# Patient Record
Sex: Female | Born: 1960 | Race: White | Hispanic: No | Marital: Married | Smoking: Former smoker
Health system: Southern US, Community
[De-identification: ages and names within clinical notes are randomized; demographics above are authoritative.]

## PROBLEM LIST (undated history)

## (undated) DIAGNOSIS — G4700 Insomnia, unspecified: Secondary | ICD-10-CM

## (undated) DIAGNOSIS — I5189 Other ill-defined heart diseases: Secondary | ICD-10-CM

## (undated) DIAGNOSIS — B009 Herpesviral infection, unspecified: Secondary | ICD-10-CM

## (undated) DIAGNOSIS — C73 Malignant neoplasm of thyroid gland: Secondary | ICD-10-CM

## (undated) DIAGNOSIS — R079 Chest pain, unspecified: Secondary | ICD-10-CM

## (undated) DIAGNOSIS — I10 Essential (primary) hypertension: Secondary | ICD-10-CM

## (undated) DIAGNOSIS — J342 Deviated nasal septum: Secondary | ICD-10-CM

## (undated) DIAGNOSIS — G709 Myoneural disorder, unspecified: Secondary | ICD-10-CM

## (undated) DIAGNOSIS — E039 Hypothyroidism, unspecified: Secondary | ICD-10-CM

## (undated) DIAGNOSIS — F32A Depression, unspecified: Secondary | ICD-10-CM

## (undated) DIAGNOSIS — F329 Major depressive disorder, single episode, unspecified: Secondary | ICD-10-CM

## (undated) DIAGNOSIS — T7840XA Allergy, unspecified, initial encounter: Secondary | ICD-10-CM

## (undated) HISTORY — DX: Malignant neoplasm of thyroid gland: C73

## (undated) HISTORY — DX: Herpesviral infection, unspecified: B00.9

## (undated) HISTORY — DX: Essential (primary) hypertension: I10

## (undated) HISTORY — DX: Depression, unspecified: F32.A

## (undated) HISTORY — DX: Insomnia, unspecified: G47.00

## (undated) HISTORY — DX: Major depressive disorder, single episode, unspecified: F32.9

## (undated) HISTORY — DX: Myoneural disorder, unspecified: G70.9

## (undated) HISTORY — DX: Deviated nasal septum: J34.2

## (undated) HISTORY — DX: Allergy, unspecified, initial encounter: T78.40XA

## (undated) HISTORY — PX: THYROIDECTOMY: SHX17

## (undated) HISTORY — DX: Hypothyroidism, unspecified: E03.9

---

## 1997-04-29 ENCOUNTER — Inpatient Hospital Stay (HOSPITAL_COMMUNITY): Admission: AD | Admit: 1997-04-29 | Discharge: 1997-04-29 | Payer: Self-pay | Admitting: Gynecology

## 1997-04-30 ENCOUNTER — Inpatient Hospital Stay (HOSPITAL_COMMUNITY): Admission: AD | Admit: 1997-04-30 | Discharge: 1997-04-30 | Payer: Self-pay | Admitting: Gynecology

## 1997-06-06 ENCOUNTER — Ambulatory Visit (HOSPITAL_COMMUNITY): Admission: RE | Admit: 1997-06-06 | Discharge: 1997-06-06 | Payer: Self-pay | Admitting: Gynecology

## 1997-07-26 ENCOUNTER — Other Ambulatory Visit: Admission: RE | Admit: 1997-07-26 | Discharge: 1997-07-26 | Payer: Self-pay | Admitting: *Deleted

## 1997-09-22 ENCOUNTER — Inpatient Hospital Stay (HOSPITAL_COMMUNITY): Admission: AD | Admit: 1997-09-22 | Discharge: 1997-09-25 | Payer: Self-pay | Admitting: Gynecology

## 1997-09-25 ENCOUNTER — Encounter (HOSPITAL_COMMUNITY): Admission: RE | Admit: 1997-09-25 | Discharge: 1997-11-16 | Payer: Self-pay | Admitting: Gynecology

## 1997-10-31 ENCOUNTER — Other Ambulatory Visit: Admission: RE | Admit: 1997-10-31 | Discharge: 1997-10-31 | Payer: Self-pay | Admitting: Gynecology

## 1998-11-26 ENCOUNTER — Other Ambulatory Visit: Admission: RE | Admit: 1998-11-26 | Discharge: 1998-11-26 | Payer: Self-pay | Admitting: Gynecology

## 2000-06-30 ENCOUNTER — Ambulatory Visit (HOSPITAL_COMMUNITY): Admission: RE | Admit: 2000-06-30 | Discharge: 2000-06-30 | Payer: Self-pay | Admitting: Gynecology

## 2000-06-30 ENCOUNTER — Encounter (INDEPENDENT_AMBULATORY_CARE_PROVIDER_SITE_OTHER): Payer: Self-pay | Admitting: *Deleted

## 2000-06-30 HISTORY — PX: DILATION AND CURETTAGE OF UTERUS: SHX78

## 2000-08-12 ENCOUNTER — Other Ambulatory Visit: Admission: RE | Admit: 2000-08-12 | Discharge: 2000-08-12 | Payer: Self-pay | Admitting: Gynecology

## 2002-03-28 ENCOUNTER — Other Ambulatory Visit: Admission: RE | Admit: 2002-03-28 | Discharge: 2002-03-28 | Payer: Self-pay | Admitting: Gynecology

## 2006-03-26 ENCOUNTER — Inpatient Hospital Stay (HOSPITAL_COMMUNITY): Admission: EM | Admit: 2006-03-26 | Discharge: 2006-03-27 | Payer: Self-pay | Admitting: Emergency Medicine

## 2006-03-26 ENCOUNTER — Encounter (INDEPENDENT_AMBULATORY_CARE_PROVIDER_SITE_OTHER): Payer: Self-pay | Admitting: Specialist

## 2006-03-26 HISTORY — PX: CHOLECYSTECTOMY: SHX55

## 2006-04-06 ENCOUNTER — Encounter: Admission: RE | Admit: 2006-04-06 | Discharge: 2006-04-06 | Payer: Self-pay | Admitting: Internal Medicine

## 2007-01-21 DIAGNOSIS — C73 Malignant neoplasm of thyroid gland: Secondary | ICD-10-CM

## 2007-01-21 HISTORY — DX: Malignant neoplasm of thyroid gland: C73

## 2007-02-03 ENCOUNTER — Ambulatory Visit: Payer: Self-pay | Admitting: Unknown Physician Specialty

## 2007-10-21 ENCOUNTER — Ambulatory Visit: Payer: Self-pay | Admitting: Oncology

## 2007-11-15 ENCOUNTER — Ambulatory Visit: Payer: Self-pay | Admitting: Unknown Physician Specialty

## 2007-11-16 ENCOUNTER — Ambulatory Visit: Payer: Self-pay | Admitting: Unknown Physician Specialty

## 2007-11-21 ENCOUNTER — Ambulatory Visit: Payer: Self-pay | Admitting: Oncology

## 2007-11-23 ENCOUNTER — Ambulatory Visit: Payer: Self-pay | Admitting: Unknown Physician Specialty

## 2007-12-03 ENCOUNTER — Ambulatory Visit: Payer: Self-pay | Admitting: Oncology

## 2007-12-21 ENCOUNTER — Ambulatory Visit: Payer: Self-pay | Admitting: Oncology

## 2008-01-10 ENCOUNTER — Ambulatory Visit: Payer: Self-pay | Admitting: Oncology

## 2008-01-21 ENCOUNTER — Ambulatory Visit: Payer: Self-pay | Admitting: Oncology

## 2008-02-21 ENCOUNTER — Ambulatory Visit: Payer: Self-pay | Admitting: Oncology

## 2008-05-02 ENCOUNTER — Encounter (HOSPITAL_COMMUNITY): Admission: RE | Admit: 2008-05-02 | Discharge: 2008-07-19 | Payer: Self-pay | Admitting: Endocrinology

## 2008-05-11 ENCOUNTER — Encounter (HOSPITAL_COMMUNITY): Admission: RE | Admit: 2008-05-11 | Discharge: 2008-07-19 | Payer: Self-pay | Admitting: Endocrinology

## 2008-05-19 ENCOUNTER — Encounter (HOSPITAL_COMMUNITY): Admission: RE | Admit: 2008-05-19 | Discharge: 2008-07-19 | Payer: Self-pay | Admitting: Endocrinology

## 2010-02-10 ENCOUNTER — Encounter: Payer: Self-pay | Admitting: Endocrinology

## 2010-05-01 LAB — HCG, SERUM, QUALITATIVE: Preg, Serum: NEGATIVE

## 2010-06-07 NOTE — H&P (Signed)
[name], [name]             ACCOUNT NO.:  [id number]   MEDICAL RECORD NO.:  [id number]          PATIENT TYPE:  EMS   LOCATION:  ED                           FACILITY:  [facility]   PHYSICIAN:  Lorne Skeens. Hoxworth, M.D.DATE OF BIRTH:  1960/06/10   DATE OF ADMISSION:  03/26/2006  DATE OF DISCHARGE:                              HISTORY & PHYSICAL   CHIEF COMPLAINT:  Right upper quadrant abdominal pain.   HISTORY OF PRESENT ILLNESS:  [name] is a 50 year old white  female who at 4:30 this morning, now about 6 hours ago, was awakened  with severe pain in her right upper quadrant.  She describes ball of  fire with pressure and burning with radiation around to her right  flank.  This was associated with nausea and vomiting.  The pain  persisted, and she presented to the [facility] emergency room.  She has  received some pain medication which initially improved the pain but it  has now recurred as the medication has worn off.  She states she has had  some milder episodic pain in the right upper quadrant for several weeks.  No other history of any significant abdominal or GI complaints.  No  fever, chills.  She has not noted any jaundice.  No change in bowel  habits, melena, hematochezia.  No urinary symptoms.   PAST MEDICAL HISTORY:  Medically she is followed for hypertension,  depression and herpes.  She has a remote history of asthma without any  recent flare-ups.  Surgery includes C-section and deviated septum.   MEDICATIONS:  1. Lisinopril.  2. Hydrochlorothiazide 20/12.5 daily.  3. Prozac 40 daily.  4. Acyclovir daily, unknown dose.   She is followed regularly by Dr. Ellamae Sia.   ALLERGIES:  None.   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  Does  not smoke cigarettes or drink alcohol.  She is married.   FAMILY HISTORY:  Noncontributory.   REVIEW OF SYSTEMS:  GENERAL:  No fever, chills, weight change.  RESPIRATORY:  No shortness  of breath, cough, wheezing.  CARDIAC:  No  chest pain, palpitations, history of heart disease.  ABDOMEN:  GI as  above.  GU:  As above.   PHYSICAL EXAM:  VITAL SIGNS:  Temperature is 97, pulse 79, respirations  20, blood pressure 123/74.  GENERAL:  She is a mildly obese white female who appears uncomfortable.  SKIN:  Warm and dry.  No rash or infection.  HEENT:  No palpable mass or thyromegaly.  Sclerae nonicteric.  LYMPH NODES:  No cervical, subclavicular, or inguinal nodes palpable.  LUNGS:  Clear without wheezing or increased work of breathing.  CARDIAC:  Regular rate and rhythm.  No murmurs.  No edema.  No JVD.  ABDOMEN:  Bowel sounds are present.  Nondistended.  There is moderate  epigastric and right upper quadrant tenderness with guarding.  No  palpable masses or hepatosplenomegaly.  EXTREMITIES:  No joint swelling or deformity.  NEUROLOGIC:  Alert, oriented.  Mental status exam is grossly normal.   LABORATORY:  CBC, electrolytes, LFTs, lipase, urinalysis all  within  normal limits.   Ultrasound of the gallbladder has been obtained showing gallstones and  thickened gallbladder wall.   ASSESSMENT/PLAN:  Acute right upper quadrant abdominal pain and  gallbladder ultrasound showing stones and thickened gallbladder wall.  She appears to have persistent biliary colic or early acute  cholecystitis.  Her pain has recurred after IV pain medication.  The  patient will be admitted, made n.p.o.  Antibiotics will be started.  We  will plan to proceed with urgent laparoscopic cholecystectomy with  cholangiogram.      Sharlet Salina T. Hoxworth, M.D.  Electronically Signed     BTH/MEDQ  D:  03/26/2006  T:  03/26/2006  Job:  [id number]

## 2010-06-07 NOTE — Op Note (Signed)
[name], [name]             ACCOUNT NO.:  [id number]   MEDICAL RECORD NO.:  [id number]          PATIENT TYPE:  INP   LOCATION:  0098                         FACILITY:  [facility]   PHYSICIAN:  Sharlet Salina T. Hoxworth, M.D.DATE OF BIRTH:  [birth year]   DATE OF PROCEDURE:  03/26/2006  DATE OF DISCHARGE:                               OPERATIVE REPORT   PREOPERATIVE DIAGNOSIS:  Cholelithiasis and cholecystitis.   POSTOPERATIVE DIAGNOSIS:  Cholelithiasis and cholecystitis.   SURGICAL PROCEDURES:  Laparoscopic cholecystectomy with intraoperative  cholangiogram.   SURGEON:  Sharlet Salina T. Hoxworth, M.D.   ASSISTANT:  Anselm Pancoast. Zachery Dakins, M.D.   ANESTHESIA:  General.   BRIEF HISTORY:  Ms. Morgan is a 50 year old female who presents with  acute severe right upper quadrant abdominal pain of less than 24 hours  duration.  She has had a workup including a gallbladder ultrasound  showing cholelithiasis and some thickening of the gallbladder wall.  She  has localized right upper quadrant tenderness.  A laparoscopic  cholecystectomy with cholangiogram has been recommended for apparent  early acute cholecystitis.  The nature of the procedure, its  indications, risks of bleeding, infection, bile leak and bile duct  injury were discussed and understood preoperatively.  She is now brought  to the operating room for the procedure.   DESCRIPTION OF PROCEDURE:  The patient was brought to the operating room  and placed in the supine position on the operating table and general  endotracheal anesthesia was induced.  The abdomen was widely sterilely  prepped and draped.  The correct patient and procedure were verified.  She received preoperative antibiotics.  PAS were in place.  Local  anesthesia was used to infiltrate the trocar sites.  A 1 cm incision was  made in the midline just above the umbilicus.  Dissection was carried  down to the midline fascia which was sharply incised 1 cm and the  peritoneum entered under direct vision.  Through a mattress sutures of 0  Vicryl, the Hassan trocar was placed and pneumoperitoneum established.  Under direct vision, a 10 mm trocar was placed in the subxiphoid area  and two 5 mm trocars on the right subcostal margin.   The gallbladder was visualized and was edematous.  The fundus was  grasped and elevated up over the liver and the infundibulum retracted  inferolaterally.  The peritoneum anterior and posterior to Calot's  triangle was incised.  The fibrofatty tissue was stripped off the neck  of the gallbladder toward the porta hepatis.  Calot's triangle was  thoroughly dissected and the distal gallbladder cystic duct junction  identified and dissected 360 degrees.  When the anatomy was clear, the  cystic duct was clipped at the gallbladder junction and an operative  cholangiogram obtained through the cystic duct.  This showed good  filling of the common bile duct and intrahepatic ducts with free flow  into the duodenum and no filling defects.  Following this, the  cholangiocath was removed and the cystic duct was doubly clipped  proximally and divided.  The cystic artery was clearly seen coursing  upon the gallbladder wall and was  divided between proximal and distal  clips.  The gallbladder was then dissected free from its bed using hook  cautery and removed through the umbilicus.  Complete hemostasis was  obtained in the gallbladder bed and the right upper quadrant irrigated  and suctioned until clear.  The trocars were removed under direct vision  and all CO2 evacuated.  The mattress suture was secured at the umbilicus and skin incisions were  closed with interrupted subcuticular 4-0 Monocryl and Steri-Strips.  Sponge, instrument, and needle counts were correct.  The patient was  taken to the recovery room in good condition.      Lorne Skeens. Hoxworth, M.D.  Electronically Signed     BTH/MEDQ  D:  03/26/2006  T:  03/26/2006   Job:  [id number]

## 2010-06-07 NOTE — Op Note (Signed)
[facility]  Patient:    [name], [name]                    MRN: [id number] Proc. Date: 06/30/00 Adm. Date:  [id number] Attending:  Douglass Rivers                           Operative Report  PREOPERATIVE DIAGNOSIS:       Dysfunctional bleeding.  POSTOPERATIVE DIAGNOSIS:      Dysfunctional bleeding.  OPERATION:                    Dilatation and curettage.  SURGEON:                      [place] S. Emilee Hero, M.D.  ANESTHESIA:                   MAC with a paracervical block.  INDICATIONS:                  The patient is a 50 year old G1, P1, with history of prolonged vaginal bleeding.  Her LMP was before May 17 and stated that beginning last night she started bleeding very heavily, nonclotted blood, using two tampons and a pad simultaneously and changing them every 30 minutes. Prolactin, TSH, gonorrhea, and chlamydial cultures were all done and were negative.  There is a family history of both breast and ovarian cancer. Ultrasound showed a possible endometrial polyp that measured 11 x 12, done incidentally without prior knowledge of the dysfunctional bleeding.  FINDINGS:                     Uterus sounded to 8 cm, was anteverted and mobile.  Pathology was endometrial curettings.  DESCRIPTION OF PROCEDURE:     The patient was taken to the operating room.  IV sedation was induced, and placed in the dorsal lithotomy position, prepped and draped in the usual sterile fashion.  A bivalve speculum was placed in the vagina.  Cervix was stabilized with a single-tooth tenaculum, and a paracervical block was placed with 10 cc of 1% lidocaine solution.  The cervix was then gently dilated up to 21 [place], and a small curette was then placed through the cervix and advanced to the cavity.  Prior to dilation, uterine sound was placed to confirm the orientation of the canal.  The curettings were obtained.  Good cri was noted throughout.  There was no gross defect  noted in the cavity.  The polyp forceps were then placed in the uterus to collect any otherwise-remaining tissue.  The instruments were then removed from the vagina.  There was gentle pressure placed at the four sites of the paracervical block that had a small amount of bleeding.  The uterus was massaged.  Reinspection of the cervix assured [place] of only a slight trickle of blood from two of the four sites of the paracervical block but no evidence of gross active bleeding from the cervix.  The patient tolerated the procedure well.  Sponge, instrument, and needle counts correct x 2, and she was taken to the PACU in stable condition. DD:  06/30/00 TD:  06/30/00 Job: [id number]

## 2010-10-28 ENCOUNTER — Institutional Professional Consult (permissible substitution): Payer: Self-pay | Admitting: Cardiology

## 2010-11-06 ENCOUNTER — Institutional Professional Consult (permissible substitution): Payer: Self-pay | Admitting: Cardiology

## 2010-11-20 ENCOUNTER — Institutional Professional Consult (permissible substitution): Payer: BC Managed Care – PPO | Admitting: Cardiology

## 2010-12-23 ENCOUNTER — Encounter: Payer: Self-pay | Admitting: *Deleted

## 2010-12-23 ENCOUNTER — Encounter: Payer: Self-pay | Admitting: Cardiology

## 2010-12-24 ENCOUNTER — Encounter: Payer: Self-pay | Admitting: *Deleted

## 2010-12-25 ENCOUNTER — Encounter: Payer: Self-pay | Admitting: Cardiology

## 2010-12-25 ENCOUNTER — Ambulatory Visit (INDEPENDENT_AMBULATORY_CARE_PROVIDER_SITE_OTHER): Payer: Self-pay | Admitting: Cardiology

## 2010-12-25 DIAGNOSIS — R079 Chest pain, unspecified: Secondary | ICD-10-CM

## 2010-12-25 DIAGNOSIS — I10 Essential (primary) hypertension: Secondary | ICD-10-CM

## 2010-12-25 MED ORDER — METOPROLOL SUCCINATE ER 50 MG PO TB24: 50.0000 mg | ORAL_TABLET | Freq: Every day | ORAL | Status: DC

## 2010-12-25 MED ORDER — METOPROLOL SUCCINATE ER 25 MG PO TB24: 25.0000 mg | ORAL_TABLET | Freq: Every day | ORAL | Status: DC

## 2010-12-25 NOTE — Progress Notes (Signed)
Addended by: Freddi Starr on: 12/25/2010 04:42 PM   Modules accepted: Orders

## 2010-12-25 NOTE — Progress Notes (Signed)
HPI: 50 yo female with no prior cardiac history for evaluation of chest pain. Patient does have dyspnea on exertion but there is no orthopnea, PND, pedal edema, palpitations, syncope or exertional chest pain. She has had problems with indigestion over the past 6 months. The symptoms are epigastric and improve with TUMS or Nexium. 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 The pain was in the left chest area and described as a sharp sensation. It increased with inspiration. There was mild diaphoresis but no shortness of breath or nausea. It radiated to her back. Duration approximately 10 minutes and resolve spontaneously. She had 2 lesser episodes since then with the most recent being 2 weeks ago. Because of the above we were asked to further evaluate.  Current Outpatient Prescriptions  Medication Sig Dispense Refill  . calcitRIOL (ROCALTROL) 0.25 MCG capsule Take 0.5 mcg by mouth daily.        Marland Kitchen desvenlafaxine (PRISTIQ) 100 MG 24 hr tablet Take 100 mg by mouth daily.        Marland Kitchen levothyroxine (SYNTHROID, LEVOTHROID) 200 MCG tablet Take 200 mcg by mouth daily.        . rizatriptan (MAXALT) 10 MG tablet Take 10 mg by mouth as needed. May repeat in 2 hours if needed. For severe migraines       . triamterene-hydrochlorothiazide (DYAZIDE) 37.5-25 MG per capsule Take 1 capsule by mouth every morning.          No Known Allergies  Past Medical History  Diagnosis Date  . HTN (hypertension)   . Herpes   . Depression   . Asthma   . Deviated septum   . Insomnia   . Hypothyroid   . Thyroid cancer     Past Surgical History  Procedure Date  . Cesarean section   . Thyroidectomy   . Dilatation and curettage   . Cholecystectomy     History   Social History  . Marital Status: Married    Spouse Name: N/A    Number of Children: 1  . Years of Education: N/A   Occupational History  .      Teach elementary school   Social History Main Topics  . Smoking status: Never  Smoker   . Smokeless tobacco: Not on file  . Alcohol Use: Yes     Rarely  . Drug Use: No  . Sexually Active: Not on file   Other Topics Concern  . 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  Does not smoke cigarettes or drink alcohol.  She is married.    Family History  Problem Relation Age of Onset  . Heart disease Father     CABG in his 40s  . Heart disease Brother     CABG  . Heart disease Sister     Stents in 32s  . Heart disease Sister     CABG in 40s    ROS:  no fevers or chills, productive cough, hemoptysis, dysphasia, odynophagia, melena, hematochezia, dysuria, hematuria, rash, seizure activity, orthopnea, PND, pedal edema, claudication. Remaining systems are negative.  Physical Exam:  Blood pressure 172/110, pulse 87, height 5\' 4"  (1.626 m), weight 192 lb (87.091 kg).  General:  Well developed/well nourished in NAD Skin warm/dry Patient not depressed No peripheral clubbing Back-normal HEENT-normal/normal eyelids Neck supple/normal carotid upstroke bilaterally; no bruits; no JVD; no thyromegaly chest - CTA/ normal expansion CV -  RRR/normal S1 and S2; no murmurs, rubs or gallops;  PMI nondisplaced Abdomen -NT/ND, no HSM, no mass, + bowel sounds, no bruit 2+ femoral pulses, no bruits Ext-no edema, chords, 2+ DP Neuro-grossly nonfocal  ECG NSR, nonspecific ST changes

## 2010-12-25 NOTE — Assessment & Plan Note (Signed)
Blood pressure elevated. Recheck in the office was 170 over 104. Continue diuretic. Add Toprol 50 mg daily. Increase medications as needed.

## 2010-12-25 NOTE — Patient Instructions (Signed)
Your physician recommends that you schedule a follow-up appointment in: 4-6 WEEKS  Your physician has requested that you have a stress echocardiogram. For further information please visit [email or web address] Please follow instruction sheet as given.   START METOPROLOL SUCC 25 MG ONCE DAILY

## 2010-12-25 NOTE — Assessment & Plan Note (Signed)
Symptoms are atypical but she has an extremely strong family history of coronary disease. No chest pain in approximately 2 weeks. Plan to proceed with a stress echocardiogram for risk stratification. Low threshold for cardiac catheterization.

## 2011-01-01 ENCOUNTER — Other Ambulatory Visit: Payer: Self-pay | Admitting: *Deleted

## 2011-01-15 ENCOUNTER — Other Ambulatory Visit (HOSPITAL_COMMUNITY): Payer: Self-pay | Admitting: Radiology

## 2011-01-20 ENCOUNTER — Ambulatory Visit (HOSPITAL_COMMUNITY): Payer: BC Managed Care – PPO | Attending: Cardiology | Admitting: Radiology

## 2011-01-20 ENCOUNTER — Ambulatory Visit (INDEPENDENT_AMBULATORY_CARE_PROVIDER_SITE_OTHER): Payer: BC Managed Care – PPO | Admitting: Physician Assistant

## 2011-01-20 DIAGNOSIS — R0609 Other forms of dyspnea: Secondary | ICD-10-CM | POA: Insufficient documentation

## 2011-01-20 DIAGNOSIS — R0989 Other specified symptoms and signs involving the circulatory and respiratory systems: Secondary | ICD-10-CM | POA: Insufficient documentation

## 2011-01-20 DIAGNOSIS — I10 Essential (primary) hypertension: Secondary | ICD-10-CM | POA: Insufficient documentation

## 2011-01-20 DIAGNOSIS — R072 Precordial pain: Secondary | ICD-10-CM | POA: Insufficient documentation

## 2011-01-20 DIAGNOSIS — R079 Chest pain, unspecified: Secondary | ICD-10-CM

## 2011-01-20 NOTE — Progress Notes (Signed)
Exercise Treadmill Test  Pre-Exercise Testing Evaluation Rhythm: normal sinus  Rate: 75   PR:  .14 QRS:  .08    Test  Exercise Tolerance Test Ordering MD: Olga Millers, MD  Interpreting MD:  Tereso Newcomer PA-C  Unique Test No: 1  Treadmill:  1  Indication for ETT: chest pain - rule out ischemia  Contraindication to ETT: No   Stress Modality: exercise - treadmill  Cardiac Imaging Performed: non   Protocol: standard Bruce - maximal  Max BP:  177/104  Max MPHR (bpm):  179 85% MPR (bpm):  145   MPHR obtained (bpm):  157 % MPHR obtained:  92%  Reached 85% MPHR (min:sec):  4:17 Total Exercise Time (min-sec):  6:00  Workload in METS:  7.0 Borg Scale: 13  Reason ETT Terminated:  patient's desire to stop    ST Segment Analysis At Rest: normal ST segments - no evidence of significant ST depression With Exercise: non-specific ST changes  Other Information Arrhythmia:  No Angina during ETT:  absent (0) Quality of ETT:  diagnostic  ETT Interpretation:  normal - no evidence of ischemia by ST analysis  Comments: Fair exercise tolerance. No chest pain.  Patient did complain of fatigue and dyspnea. Normal BP response to exercise. No ST-T changes to suggest ischemia.   Recommendations: Follow up with Dr. Olga Millers as directed. Needs better blood pressure control. Tereso Newcomer, PA-C  12:28 PM 01/20/2011

## 2011-01-29 ENCOUNTER — Encounter: Payer: Self-pay | Admitting: Cardiology

## 2011-01-29 NOTE — Progress Notes (Signed)
   HPI: Pleasant female I recently saw in Dec of 2012 for evaluation of chest pain. Ett    Current Outpatient Prescriptions  Medication Sig Dispense Refill  . calcitRIOL (ROCALTROL) 0.25 MCG capsule Take 0.5 mcg by mouth daily.        Marland Kitchen desvenlafaxine (PRISTIQ) 100 MG 24 hr tablet Take 100 mg by mouth daily.        Marland Kitchen levothyroxine (SYNTHROID, LEVOTHROID) 200 MCG tablet Take 200 mcg by mouth daily.        . metoprolol (TOPROL XL) 50 MG 24 hr tablet Take 1 tablet (50 mg total) by mouth daily.  30 tablet  11  . rizatriptan (MAXALT) 10 MG tablet Take 10 mg by mouth as needed. May repeat in 2 hours if needed. For severe migraines       . triamterene-hydrochlorothiazide (DYAZIDE) 37.5-25 MG per capsule Take 1 capsule by mouth every morning.           Past Medical History  Diagnosis Date  . HTN (hypertension)   . Herpes   . Depression   . Asthma   . Deviated septum   . Insomnia   . Hypothyroid   . Thyroid cancer     Past Surgical History  Procedure Date  . Cesarean section   . Thyroidectomy   . Dilatation and curettage   . Cholecystectomy     History   Social History  . Marital Status: Married    Spouse Name: N/A    Number of Children: 1  . Years of Education: N/A   Occupational History  .      Teach elementary school   Social History Main Topics  . Smoking status: Never Smoker   . Smokeless tobacco: Not on file  . Alcohol Use: Yes     Rarely  . Drug Use: No  . Sexually Active: Not on file   Other Topics Concern  . 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  Does not smoke cigarettes or drink alcohol.  She is married.    ROS: no fevers or chills, productive cough, hemoptysis, dysphasia, odynophagia, melena, hematochezia, dysuria, hematuria, rash, seizure activity, orthopnea, PND, pedal edema, claudication. Remaining systems are negative.  Physical Exam: Well-developed well-nourished in no acute distress.  Skin  is warm and dry.  HEENT is normal.  Neck is supple. No thyromegaly.  Chest is clear to auscultation with normal expansion.  Cardiovascular exam is regular rate and rhythm.  Abdominal exam nontender or distended. No masses palpated. Extremities show no edema. neuro grossly intact  ECG     This encounter was created in error - please disregard.

## 2011-02-16 ENCOUNTER — Ambulatory Visit (INDEPENDENT_AMBULATORY_CARE_PROVIDER_SITE_OTHER): Payer: BC Managed Care – PPO

## 2011-02-16 DIAGNOSIS — J019 Acute sinusitis, unspecified: Secondary | ICD-10-CM

## 2011-02-25 ENCOUNTER — Telehealth: Payer: Self-pay

## 2011-02-25 NOTE — Telephone Encounter (Signed)
.  [facility] PATIENT STATES DR. Merla Riches GAVE HER AUGMENTIN FOR HER SINUS INFECTION. SHE HAS TAKEN 10 DAYS OF IT AND STILL IT HAS NOT CLEARED UP. SHE WOULD LIKE TO HAVE LEVEQUIN INSTEAD. BEST PHONE [phone]  [facility] IS [facility] [place] ON [address]

## 2011-02-26 NOTE — Telephone Encounter (Signed)
LMOM to Cb-- please get symptoms patient is still having.

## 2011-02-26 NOTE — Telephone Encounter (Signed)
Pt reports got a little better but not much. Still green nasal congestion. Please Rx Levequin if possible.

## 2011-02-27 ENCOUNTER — Telehealth: Payer: Self-pay

## 2011-02-27 MED ORDER — LEVOFLOXACIN 500 MG PO TABS: 500.0000 mg | ORAL_TABLET | Freq: Every day | ORAL | Status: AC

## 2011-02-27 NOTE — Telephone Encounter (Signed)
[facility] [employer] sent Rx for Levequin 500 mg to [facility] [address] [place] per Dr [employer] verbal OK. LMOM to notify pt per her request.

## 2011-04-09 ENCOUNTER — Other Ambulatory Visit: Payer: Self-pay | Admitting: Internal Medicine

## 2011-05-03 ENCOUNTER — Other Ambulatory Visit: Payer: Self-pay | Admitting: Internal Medicine

## 2011-05-13 ENCOUNTER — Ambulatory Visit (INDEPENDENT_AMBULATORY_CARE_PROVIDER_SITE_OTHER): Payer: BC Managed Care – PPO | Admitting: Internal Medicine

## 2011-05-13 VITALS — BP 142/81 | HR 89 | Temp 98.8°F | Resp 20 | Ht 64.5 in | Wt 180.2 lb

## 2011-05-13 DIAGNOSIS — F329 Major depressive disorder, single episode, unspecified: Secondary | ICD-10-CM | POA: Insufficient documentation

## 2011-05-13 DIAGNOSIS — J019 Acute sinusitis, unspecified: Secondary | ICD-10-CM

## 2011-05-13 DIAGNOSIS — G43909 Migraine, unspecified, not intractable, without status migrainosus: Secondary | ICD-10-CM | POA: Insufficient documentation

## 2011-05-13 DIAGNOSIS — E890 Postprocedural hypothyroidism: Secondary | ICD-10-CM | POA: Insufficient documentation

## 2011-05-13 DIAGNOSIS — J309 Allergic rhinitis, unspecified: Secondary | ICD-10-CM

## 2011-05-13 DIAGNOSIS — J45909 Unspecified asthma, uncomplicated: Secondary | ICD-10-CM

## 2011-05-13 DIAGNOSIS — C73 Malignant neoplasm of thyroid gland: Secondary | ICD-10-CM | POA: Insufficient documentation

## 2011-05-13 MED ORDER — PREDNISONE 20 MG PO TABS: ORAL_TABLET | ORAL | Status: DC

## 2011-05-13 MED ORDER — AMOXICILLIN-POT CLAVULANATE 875-125 MG PO TABS: 1.0000 | ORAL_TABLET | Freq: Two times a day (BID) | ORAL | Status: AC

## 2011-05-13 NOTE — Progress Notes (Signed)
  Subjective:    Patient ID: [name], female    DOB: [birth year], 51 y.o.   MRN: [id number]  HPI Sinus congestion with purulent discharge, sneezing, irritation, pain and  sinus area for 2 weeks. 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

## 2011-05-24 ENCOUNTER — Telehealth: Payer: Self-pay

## 2011-05-24 NOTE — Telephone Encounter (Signed)
Dr Merla Riches rx'd antibiotics about 2 weeks ago for sinus inf and now pt has yeast infection (mouth and elsewhere). [facility] [address] [facility] [place] Please call pt to confirm

## 2011-05-24 NOTE — Telephone Encounter (Signed)
Can we do this prescription?

## 2011-05-25 MED ORDER — FLUCONAZOLE 150 MG PO TABS: 150.0000 mg | ORAL_TABLET | Freq: Once | ORAL | Status: AC

## 2011-05-25 NOTE — Telephone Encounter (Signed)
Rx sent. Please advise patient.

## 2011-05-25 NOTE — Telephone Encounter (Signed)
LMOM THAT RX WAS SENT INTO PHARMACY 

## 2011-05-25 NOTE — Telephone Encounter (Signed)
Pt is requesting Dr Merla Riches to return her phone , she not understanding why it's taking so long for Dr to sign off medication.

## 2011-06-06 ENCOUNTER — Ambulatory Visit (INDEPENDENT_AMBULATORY_CARE_PROVIDER_SITE_OTHER): Payer: BC Managed Care – PPO | Admitting: Internal Medicine

## 2011-06-06 VITALS — BP 153/90 | HR 84 | Temp 97.9°F | Resp 16 | Ht 64.5 in | Wt 172.6 lb

## 2011-06-06 DIAGNOSIS — IMO0002 Reserved for concepts with insufficient information to code with codable children: Secondary | ICD-10-CM

## 2011-06-06 LAB — POCT WET PREP WITH KOH
KOH Prep POC: NEGATIVE
Trichomonas, UA: NEGATIVE
Yeast Wet Prep HPF POC: NEGATIVE

## 2011-06-06 MED ORDER — FLUCONAZOLE 150 MG PO TABS: 150.0000 mg | ORAL_TABLET | Freq: Once | ORAL | Status: AC

## 2011-06-06 NOTE — Progress Notes (Signed)
  Subjective:    Patient ID: [name], female    DOB: [birth year], 51 y.o.   MRN: [id number]  HPIShe describes a progressive problem with pain on the superior aspect of the vaginal vault with any type of penetration over the past 6-8 months. She was evaluated by her gynecologist in [place] several times In the fall of 2012 for this problem including with pelvic ultrasound but no etiology was discovered. She comes in today because as she attempted intercourse last night she had this same pain and in addition had bleeding for the first time. She was on antibiotics 2 weeks ago for sinus infection and has had yeast symptoms which she treated with oral Diflucan this week. She currently has  a labial itch But no specific discharge. She has a history of HSV which has been inactive for many years. She denies dysuria frequency, difficulty voiding. She sometimes feels uncomfortable with pressure in the suprapubic area as well.    Review of Systems Patient Active Problem List  Diagnoses  . Chest pain-Noncardiac  . Hypertension-Stable  . Allergic rhinitis, cause unspecified  . Reactive airway disease-Stable  . Depressed-Stable  . Migraine-No recent problems  . Thyroid cancer-Stable  . Hypothyroidism, postsurgical Obesity-she is making great strides with weight loss!        Objective:   Physical ExamBlood pressure 153/90 Abdomen soft nontender nondistended with no organomegaly or masses/there is vague tenderness in the suprapubic area Pelvic= the introitus is clear  There is a 1 cm x 3 cm tubular firmness on the superior aspect of the vaginal vault in the outer one third that possibly surrounds the urethra, although there are no external changes at the urethral meatus/this area is tender and this is definitely the pain she feels with penetration Whatever this is does not exclude and into the posterior half of the vaginal vault/osseous clear and not friable        Results for orders  placed in visit on 06/06/11  POCT WET PREP WITH KOH      Component Value Range   Trichomonas, UA Negative     Clue Cells Wet Prep HPF POC neg     Epithelial Wet Prep HPF POC 0-1     Yeast Wet Prep HPF POC neg     Bacteria Wet Prep HPF POC trace     RBC Wet Prep HPF POC 0-1     WBC Wet Prep HPF POC 0-1     KOH Prep POC Negative      Assessment & Plan:  Problem #1 dyspareunia Problem #2 tender vaginal lesion of uncertain etiology  I have set up an evaluation by Dr. Reynaldo Minium rather than going directly to urology

## 2011-06-07 LAB — GC/CHLAMYDIA PROBE AMP, GENITAL: GC Probe Amp, Genital: NEGATIVE

## 2011-06-13 ENCOUNTER — Ambulatory Visit: Payer: BC Managed Care – PPO | Admitting: Gynecology

## 2011-06-17 ENCOUNTER — Ambulatory Visit (INDEPENDENT_AMBULATORY_CARE_PROVIDER_SITE_OTHER): Payer: BC Managed Care – PPO | Admitting: Gynecology

## 2011-06-17 ENCOUNTER — Encounter: Payer: Self-pay | Admitting: Gynecology

## 2011-06-17 ENCOUNTER — Other Ambulatory Visit (HOSPITAL_COMMUNITY)
Admission: RE | Admit: 2011-06-17 | Discharge: 2011-06-17 | Disposition: A | Discharge status: Home / Self Care | Payer: BC Managed Care – PPO | Source: Ambulatory Visit | Attending: Gynecology | Admitting: Gynecology

## 2011-06-17 VITALS — BP 150/96 | Ht 64.0 in | Wt 167.0 lb

## 2011-06-17 DIAGNOSIS — N951 Menopausal and female climacteric states: Secondary | ICD-10-CM

## 2011-06-17 DIAGNOSIS — N952 Postmenopausal atrophic vaginitis: Secondary | ICD-10-CM

## 2011-06-17 DIAGNOSIS — R635 Abnormal weight gain: Secondary | ICD-10-CM

## 2011-06-17 DIAGNOSIS — Z780 Asymptomatic menopausal state: Secondary | ICD-10-CM | POA: Insufficient documentation

## 2011-06-17 DIAGNOSIS — N949 Unspecified condition associated with female genital organs and menstrual cycle: Secondary | ICD-10-CM

## 2011-06-17 DIAGNOSIS — Z01419 Encounter for gynecological examination (general) (routine) without abnormal findings: Secondary | ICD-10-CM | POA: Insufficient documentation

## 2011-06-17 DIAGNOSIS — R102 Pelvic and perineal pain: Secondary | ICD-10-CM

## 2011-06-17 MED ORDER — ESTRADIOL 0.52 MG/0.87 GM (0.06%) TD GEL: 1.0000 "application " | Freq: Every day | TRANSDERMAL | Status: DC

## 2011-06-17 MED ORDER — PROGESTERONE MICRONIZED 200 MG PO CAPS: ORAL_CAPSULE | ORAL | Status: DC

## 2011-06-17 NOTE — Patient Instructions (Addendum)
Menopause Menopause is the normal time of life when menstrual periods stop completely. Menopause is complete when you have missed 12 consecutive menstrual periods. It usually occurs between the ages of 21 to 40, with an average age of 36. Very rarely does a woman develop menopause before 50 years old. At menopause, your ovaries stop producing the female hormones, estrogen and progesterone. This can cause undesirable symptoms and also affect your health. Sometimes the symptoms may occur 4 to 5 years before the menopause begins. There is no relationship between menopause and:  Oral contraceptives.   Number of children you had.   Race.   The age your menstrual periods started (menarche).  Heavy smokers and very thin women may develop menopause earlier in life. CAUSES  The ovaries stop producing the female hormones estrogen and progesterone.   Other causes include:   Surgery to remove both ovaries.   The ovaries stop functioning for no known reason.   Tumors of the pituitary gland in the brain.   Medical disease that affects the ovaries and hormone production.   Radiation treatment to the abdomen or pelvis.   Chemotherapy that affects the ovaries.  SYMPTOMS   Hot flashes.   Night sweats.   Decrease in sex drive.   Vaginal dryness and thinning of the vagina causing painful intercourse.   Dryness of the skin and developing wrinkles.   Headaches.   Tiredness.   Irritability.   Memory problems.   Weight gain.   Bladder infections.   Hair growth of the face and chest.   Infertility.  More serious symptoms include:  Loss of bone (osteoporosis) causing breaks (fractures).   Depression.   Hardening and narrowing of the arteries (atherosclerosis) causing heart attacks and strokes.  DIAGNOSIS   When the menstrual periods have stopped for 12 straight months.   Physical exam.   Hormone studies of the blood.  TREATMENT  There are many treatment choices and  nearly as many questions about them. The decisions to treat or not to treat menopausal changes is an individual choice made with your caregiver. Your caregiver can discuss the treatments with you. Together, you can decide which treatment will work best for you. Your treatment choices may include:   Hormone therapy (estorgen and progesterone).   Non-hormonal medications.   Treating the individual symptoms with medication (for example antidepressants for depression).   Herbal medications that may help specific symptoms.   Counseling by a psychiatrist or psychologist.   Group therapy.   Lifestyle changes including:   Eating healthy.   Regular exercise.   Limiting caffeine and alcohol.   Stress management and meditation.   No treatment.  HOME CARE INSTRUCTIONS   Take the medication your caregiver gives you as directed.   Get plenty of sleep and rest.   Exercise regularly.   Eat a diet that contains calcium (good for the bones) and soy products (acts like estrogen hormone).   Avoid alcoholic beverages.   Do not smoke.   If you have hot flashes, dress in layers.   Take supplements, calcium and vitamin D to strengthen bones.   You can use over-the-counter lubricants or moisturizers for vaginal dryness.   Group therapy is sometimes very helpful.   Acupuncture may be helpful in some cases.  SEEK MEDICAL CARE IF:   You are not sure you are in menopause.   You are having menopausal symptoms and need advice and treatment.   You are still having menstrual periods after age 56.  You have pain with intercourse.   Menopause is complete (no menstrual period for 12 months) and you develop vaginal bleeding.   You need a referral to a specialist (gynecologist, psychiatrist or psychologist) for treatment.  SEEK IMMEDIATE MEDICAL CARE IF:   You have severe depression.   You have excessive vaginal bleeding.   You fell and think you have a broken bone.   You have pain  when you urinate.   You develop leg or chest pain.   You have a fast pounding heart beat (palpitations).   You have severe headaches.   You develop vision problems.   You feel a lump in your breast.   You have abdominal pain or severe indigestion.  Document Released: 03/29/2003 Document Revised: 12/26/2010 Document Reviewed: 11/04/2007 [facility] Patient Information 2012 [place], [place].                                                                                                                                  Cholesterol Control Diet  Cholesterol levels in your body are determined significantly by your diet. Cholesterol levels may also be related to heart disease. The following material helps to explain this relationship and discusses what you can do to help keep your heart healthy. Not all cholesterol is bad. Low-density lipoprotein (LDL) cholesterol is the "bad" cholesterol. It may cause fatty deposits to build up inside your arteries. High-density lipoprotein (HDL) cholesterol is "good." It helps to remove the "bad" LDL cholesterol from your blood. Cholesterol is a very important risk factor for heart disease. Other risk factors are high blood pressure, smoking, stress, heredity, and weight. The heart muscle gets its supply of blood through the coronary arteries. If your LDL cholesterol is high and your HDL cholesterol is low, you are at risk for having fatty deposits build up in your coronary arteries. This leaves less room through which blood can flow. Without sufficient blood and oxygen, the heart muscle cannot function properly and you may feel chest pains (angina pectoris). When a coronary artery closes up entirely, a part of the heart muscle may die, causing a heart attack (myocardial infarction). CHECKING CHOLESTEROL When your caregiver sends your blood to a lab to be analyzed for cholesterol, a complete lipid (fat) profile may be done. With this test, the total amount of  cholesterol and levels of LDL and HDL are determined. Triglycerides are a type of fat that circulates in the blood and can also be used to determine heart disease risk. The list below describes what the numbers should be: Test: Total Cholesterol.  Less than 200 mg/dl.  Test: LDL "bad cholesterol."  Less than 100 mg/dl.   Less than 70 mg/dl if you are at very high risk of a heart attack or sudden cardiac death.  Test: HDL "good cholesterol."  Greater than 50 mg/dl for women.   Greater than 40 mg/dl for men.  Test: Triglycerides.  Less than 150 mg/dl.  CONTROLLING CHOLESTEROL WITH DIET Although exercise and lifestyle factors are important, your diet is key. That is because certain foods are known to raise cholesterol and others to lower it. The goal is to balance foods for their effect on cholesterol and more importantly, to replace saturated and trans fat with other types of fat, such as monounsaturated fat, polyunsaturated fat, and omega-3 fatty acids. On average, a person should consume no more than 15 to 17 g of saturated fat daily. Saturated and trans fats are considered "bad" fats, and they will raise LDL cholesterol. Saturated fats are primarily found in animal products such as meats, butter, and cream. However, that does not mean you need to sacrifice all your favorite foods. Today, there are good tasting, low-fat, low-cholesterol substitutes for most of the things you like to eat. Choose low-fat or nonfat alternatives. Choose round or loin cuts of red meat, since these types of cuts are lowest in fat and cholesterol. Chicken (without the skin), fish, veal, and ground [place] breast are excellent choices. Eliminate fatty meats, such as hot dogs and salami. Even shellfish have little or no saturated fat. Have a 3 oz (85 g) portion when you eat lean meat, poultry, or fish. Trans fats are also called "partially hydrogenated oils." They are oils that have been scientifically manipulated so that  they are solid at room temperature resulting in a longer shelf life and improved taste and texture of foods in which they are added. Trans fats are found in stick margarine, some tub margarines, cookies, crackers, and baked goods.  When baking and cooking, oils are an excellent substitute for butter. The monounsaturated oils are especially beneficial since it is believed they lower LDL and raise HDL. The oils you should avoid entirely are saturated tropical oils, such as coconut and palm.  Remember to eat liberally from food groups that are naturally free of saturated and trans fat, including fish, fruit, vegetables, beans, grains (barley, rice, couscous, bulgur wheat), and pasta (without cream sauces).  IDENTIFYING FOODS THAT LOWER CHOLESTEROL  Soluble fiber may lower your cholesterol. This type of fiber is found in fruits such as apples, vegetables such as broccoli, potatoes, and carrots, legumes such as beans, peas, and lentils, and grains such as barley. Foods fortified with plant sterols (phytosterol) may also lower cholesterol. You should eat at least 2 g per day of these foods for a cholesterol lowering effect.  Read package labels to identify low-saturated fats, trans fats free, and low-fat foods at the supermarket. Select cheeses that have only 2 to 3 g saturated fat per ounce. Use a heart-healthy tub margarine that is free of trans fats or partially hydrogenated oil. When buying baked goods (cookies, crackers), avoid partially hydrogenated oils. Breads and muffins should be made from whole grains (whole-wheat or whole oat flour, instead of "flour" or "enriched flour"). Buy non-creamy canned soups with reduced salt and no added fats.  FOOD PREPARATION TECHNIQUES  Never deep-fry. If you must fry, either stir-fry, which uses very little fat, or use non-stick cooking sprays. When possible, broil, bake, or roast meats, and steam vegetables. Instead of dressing vegetables with butter or margarine, use  lemon and herbs, applesauce and cinnamon (for squash and sweet potatoes), nonfat yogurt, salsa, and low-fat dressings for salads.  LOW-SATURATED FAT / LOW-FAT FOOD SUBSTITUTES Meats / Saturated Fat (g)  Avoid: Steak, marbled (3 oz/85 g) / 11 g   Choose: Steak, lean (3 oz/85 g) / 4 g   Avoid: Hamburger (3 oz/85  g) / 7 g   Choose: Hamburger, lean (3 oz/85 g) / 5 g   Avoid: Ham (3 oz/85 g) / 6 g   Choose: Ham, lean cut (3 oz/85 g) / 2.4 g   Avoid: Chicken, with skin, dark meat (3 oz/85 g) / 4 g   Choose: Chicken, skin removed, dark meat (3 oz/85 g) / 2 g   Avoid: Chicken, with skin, light meat (3 oz/85 g) / 2.5 g   Choose: Chicken, skin removed, light meat (3 oz/85 g) / 1 g  Dairy / Saturated Fat (g)  Avoid: Whole milk (1 cup) / 5 g   Choose: Low-fat milk, 2% (1 cup) / 3 g   Choose: Low-fat milk, 1% (1 cup) / 1.5 g   Choose: Skim milk (1 cup) / 0.3 g   Avoid: Hard cheese (1 oz/28 g) / 6 g   Choose: Skim milk cheese (1 oz/28 g) / 2 to 3 g   Avoid: Cottage cheese, 4% fat (1 cup) / 6.5 g   Choose: Low-fat cottage cheese, 1% fat (1 cup) / 1.5 g   Avoid: Ice cream (1 cup) / 9 g   Choose: Sherbet (1 cup) / 2.5 g   Choose: Nonfat frozen yogurt (1 cup) / 0.3 g   Choose: Frozen fruit bar / trace   Avoid: Whipped cream (1 tbs) / 3.5 g   Choose: Nondairy whipped topping (1 tbs) / 1 g  Condiments / Saturated Fat (g)  Avoid: Mayonnaise (1 tbs) / 2 g   Choose: Low-fat mayonnaise (1 tbs) / 1 g   Avoid: Butter (1 tbs) / 7 g   Choose: Extra light margarine (1 tbs) / 1 g   Avoid: Coconut oil (1 tbs) / 11.8 g   Choose: Olive oil (1 tbs) / 1.8 g   Choose: Corn oil (1 tbs) / 1.7 g   Choose: Safflower oil (1 tbs) / 1.2 g   Choose: Sunflower oil (1 tbs) / 1.4 g   Choose: Soybean oil (1 tbs) / 2.4 g   Choose: Canola oil (1 tbs) / 1 g  Document Released: 01/06/2005 Document Revised: 09/18/2010 Document Reviewed: 06/27/2010 [facility] Patient Information 2012 [place],  [place].  Exercise to Lose Weight Exercise and a healthy diet may help you lose weight. Your doctor may suggest specific exercises. EXERCISE IDEAS AND TIPS  Choose low-cost things you enjoy doing, such as walking, bicycling, or exercising to workout videos.   Take stairs instead of the elevator.   Walk during your lunch break.   Park your car further away from work or school.   Go to a gym or an exercise class.   Start with 5 to 10 minutes of exercise each day. Build up to 30 minutes of exercise 4 to 6 days a week.   Wear shoes with good support and comfortable clothes.   Stretch before and after working out.   Work out until you breathe harder and your heart beats faster.   Drink extra water when you exercise.   Do not do so much that you hurt yourself, feel dizzy, or get very short of breath.  Exercises that burn about 150 calories:  Running 1  miles in 15 minutes.   Playing volleyball for 45 to 60 minutes.   Washing and waxing a car for 45 to 60 minutes.   Playing touch football for 45 minutes.   Walking 1  miles in 35 minutes.   Pushing a stroller 1  miles in 30 minutes.   Playing basketball for 30 minutes.   Raking leaves for 30 minutes.   Bicycling 5 miles in 30 minutes.   Walking 2 miles in 30 minutes.   Dancing for 30 minutes.   Shoveling snow for 15 minutes.   Swimming laps for 20 minutes.   Walking up stairs for 15 minutes.   Bicycling 4 miles in 15 minutes.   Gardening for 30 to 45 minutes.   Jumping rope for 15 minutes.   Washing windows or floors for 45 to 60 minutes.  Document Released: 02/08/2010 Document Revised: 09/18/2010 Document Reviewed: 02/08/2010 [facility] Patient Information 2012 [place], [place].   Start the Estrace vagina; cream tonight for one week then start the Elestrin topical. The prometrium can be started 1 st of every month starting in July. Will see you next wek for ultrasound

## 2011-06-17 NOTE — Progress Notes (Signed)
[name] [birth year] [id number]   History:    51 y.o.  for annual gyn exam as well as for an evaluation of the anterior region of her vagina which she states that for the past 6-8 months she has had discomfort especially during penetration at time of intercourse. She is denies any vaginal discharge. She is 51 years of age and states that she has not had a menstrual cycle in several years. She does suffer from vasomotor symptoms consisting of hot flashes irritability mood swings and vaginal dryness along with insomnia. She had seen another gynecologist in [place], [place]. in 2012 for the painful vaginal syndrome that she had been experiencing and had an ultrasound but no etiology was ever established. She has been treated for moniliasis with Diflucan in the past. She also has a history of HSV but according to her she has not had a flareup in many years. Patient is not having any urinary symptoms. Patient does have a past history of thyroid cancer and after surgery developed hypothyroidism for which she's currently under treatment.  Past medical history,surgical history, family history and social history were all reviewed and documented in the EPIC chart.  Gynecologic History Patient's last menstrual period was 06/17/2002. Contraception: none Last Pap: 2 years ago. Results were: normal Last mammogram: No prior study. Results were: No prior study  Obstetric History OB History    Grav Para Term Preterm Abortions TAB SAB Ect Mult Living   3 1  1 2     1      # Outc Date GA Lbr Len/2nd Wgt Sex Del Anes PTL Lv   1 PRE     M CS  Yes Yes   2 ABT            3 ABT                ROS: A ROS was performed and pertinent positives and negatives are included in the history.  GENERAL: No fevers or chills. HEENT: No change in vision, no earache, sore throat or sinus congestion. NECK: No pain or stiffness. CARDIOVASCULAR: No chest pain or pressure. No palpitations. PULMONARY: No shortness of breath,  cough or wheeze. GASTROINTESTINAL: No abdominal pain, nausea, vomiting or diarrhea, melena or bright red blood per rectum. GENITOURINARY: No urinary frequency, urgency, hesitancy or dysuria. MUSCULOSKELETAL: No joint or muscle pain, no back pain, no recent trauma. DERMATOLOGIC: No rash, no itching, no lesions. ENDOCRINE: No polyuria, polydipsia, no heat or cold intolerance. No recent change in weight. HEMATOLOGICAL: No anemia or easy bruising or bleeding. NEUROLOGIC: No headache, seizures, numbness, tingling or weakness. PSYCHIATRIC: No depression, no loss of interest in normal activity or change in sleep pattern.     Exam: chaperone present  BP 150/96  Ht 5\' 4"  (1.626 m)  Wt 167 lb (75.751 kg)  BMI 28.67 kg/m2  LMP 06/17/2002  Body mass index is 28.67 kg/(m^2).  General appearance : Well developed well nourished female. No acute distress HEENT: Neck supple, trachea midline, no carotid bruits, no thyroidmegaly Lungs: Clear to auscultation, no rhonchi or wheezes, or rib retractions  Heart: Regular rate and rhythm, no murmurs or gallops Breast:Examined in sitting and supine position were symmetrical in appearance, no palpable masses or tenderness,  no skin retraction, no nipple inversion, no nipple discharge, no skin discoloration, no axillary or supraclavicular lymphadenopathy Abdomen: no palpable masses or tenderness, no rebound or guarding Extremities: no edema or skin discoloration or tenderness  Pelvic:  Bartholin, Urethra, Skene Glands:  Within normal limits.             Vagina: No gross lesions or discharge, atrophic changes tender vagina especially underneath the urethra. No discernible mass per se seen  Cervix: No gross lesions or discharge  Uterus  anteverted, normal size, shape and consistency, non-tender and mobile  Adnexa  Without masses or tenderness  Anus and perineum  normal   Rectovaginal  normal sphincter tone without palpated masses or tenderness             Hemoccult  not done     Assessment/Plan:  51 y.o. female for annual exam with signs and symptoms consistent with the menopause. Patient would no prior FSH drawn. Patient with significant amount of vaginal atrophy tender vagina. She is going to be placed on Estrace vaginal cream each bedtime for the next 7-10 days and then we will start her on the elestrin 0.06% transdermal one pump application on one arm daily with the addition of Prometrium 200 mg daily for 12 days of the month. She will return back next week for a transvaginal ultrasound. The following labs will be drawn today: CBC, cholesterol, random blood sugar, urinalysis, and Pap smear. New Pap smear screening guidelines discussed. A requisition to schedule mammogram was provided. We had a lengthy discussion of the risks benefits pros and cons of hormone replacement therapy as well as the women's health initiative study and literature formation was provided. Her vaginal/vulvodynia I cannot pinpoint at the present time but hopefully if we can build up her vaginal epithelium with the estrogen this will help her lubrication perhaps improve her tenderness. I have recommended she use a nonalcohol base lubricant during intercourse as well. We may decide to proceed also with a colposcopic evaluation of the genitalia after the ultrasound and after the above tests are been completed. We discussed also the importance of calcium and vitamin D for osteoporosis prevention. All questions rancher will follow accordingly. Once evaluation is completed we'll send a note to her primary physician who referred her Dr. Yong Channel.   Ok Edwards MD, 6:08 PM 06/17/2011

## 2011-06-18 LAB — CBC WITH DIFFERENTIAL/PLATELET
Basophils Absolute: 0 10*3/uL (ref 0.0–0.1)
Basophils Relative: 0 % (ref 0–1)
Eosinophils Absolute: 0.1 10*3/uL (ref 0.0–0.7)
Hemoglobin: 15.7 g/dL — ABNORMAL HIGH (ref 12.0–15.0)
MCH: 28.1 pg (ref 26.0–34.0)
MCHC: 34.8 g/dL (ref 30.0–36.0)
Monocytes Relative: 6 % (ref 3–12)
Neutro Abs: 2.9 10*3/uL (ref 1.7–7.7)
Neutrophils Relative %: 54 % (ref 43–77)
Platelets: 215 10*3/uL (ref 150–400)
RDW: 13.8 % (ref 11.5–15.5)

## 2011-06-18 LAB — URINALYSIS W MICROSCOPIC + REFLEX CULTURE
Hgb urine dipstick: NEGATIVE
Leukocytes, UA: NEGATIVE
Nitrite: NEGATIVE
Protein, ur: NEGATIVE mg/dL
Squamous Epithelial / LPF: NONE SEEN
Urobilinogen, UA: 0.2 mg/dL (ref 0.0–1.0)

## 2011-06-18 LAB — CHOLESTEROL, TOTAL: Cholesterol: 157 mg/dL (ref 0–200)

## 2011-06-18 LAB — FOLLICLE STIMULATING HORMONE: FSH: 96.4 m[IU]/mL

## 2011-06-18 LAB — GLUCOSE, RANDOM: Glucose, Bld: 90 mg/dL (ref 70–99)

## 2011-06-30 ENCOUNTER — Ambulatory Visit: Payer: BC Managed Care – PPO | Admitting: Gynecology

## 2011-06-30 ENCOUNTER — Other Ambulatory Visit: Payer: BC Managed Care – PPO

## 2011-07-06 ENCOUNTER — Ambulatory Visit (INDEPENDENT_AMBULATORY_CARE_PROVIDER_SITE_OTHER): Payer: Managed Care, Other (non HMO) | Admitting: Internal Medicine

## 2011-07-06 VITALS — BP 143/79 | HR 81 | Temp 98.1°F | Resp 16 | Ht 65.5 in | Wt 165.2 lb

## 2011-07-06 DIAGNOSIS — J329 Chronic sinusitis, unspecified: Secondary | ICD-10-CM

## 2011-07-06 DIAGNOSIS — L02212 Cutaneous abscess of back [any part, except buttock]: Secondary | ICD-10-CM

## 2011-07-06 DIAGNOSIS — M549 Dorsalgia, unspecified: Secondary | ICD-10-CM

## 2011-07-06 DIAGNOSIS — L02219 Cutaneous abscess of trunk, unspecified: Secondary | ICD-10-CM

## 2011-07-06 DIAGNOSIS — J309 Allergic rhinitis, unspecified: Secondary | ICD-10-CM

## 2011-07-06 MED ORDER — MONTELUKAST SODIUM 10 MG PO TABS: 10.0000 mg | ORAL_TABLET | Freq: Every day | ORAL | Status: DC

## 2011-07-06 MED ORDER — KETOROLAC TROMETHAMINE 60 MG/2ML IM SOLN
60.0000 mg | Freq: Once | INTRAMUSCULAR | Status: AC
  Administered 2011-07-06: 60 mg via INTRAMUSCULAR

## 2011-07-06 MED ORDER — LEVOFLOXACIN 500 MG PO TABS: 500.0000 mg | ORAL_TABLET | Freq: Every day | ORAL | Status: AC

## 2011-07-06 MED ORDER — HYDROCODONE-ACETAMINOPHEN 5-500 MG PO TABS: 1.0000 | ORAL_TABLET | Freq: Four times a day (QID) | ORAL | Status: AC | PRN

## 2011-07-06 NOTE — Progress Notes (Signed)
  Subjective:    Patient ID: [name], female    DOB: 01/05/1961, 51 y.o.   MRN: [id number]  HPIComplaining of significant pain in the mid back x5 days with a swollen place for the last 48 hours No known injury  Also complaining of congestion with purulent nasal mucus and inability to breathe at night for the last 7 days. Treated for sinusitis from allergic rhinitis in April with Augmentin and prednisone and really did not respond. Symptoms on and off ever since but now much worse over the last week. No cough. No wheezing. Feels like a low-grade fever. Headache. Long history of allergic problems and has been on Singulair in the past in addition to her current regimen.  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  Review of [place] has lost 15 pounds in the last 2 months as she continues her aggressive diet she has lost 35 pounds since January 2013 No fatigue No chest pain No wheezing No nausea vomiting     Objective:   Physical Exam In obvious discomfort from the lesion on her back TMs clear Nares boggy with purulent discharge Maxillary area is tender to percussion Oropharynx clear Chest without wheezing Back has a 2 cm red tender abscess in the midthoracic area   -Procedure-3 cc 2% lidocaine with epinephrine for anesthesia 1.5 cm opening with the 11 blade Removed copious Sebaceous debris and pus There was no intact cell wall for cyst Wick placed She was in great discomfort from this abscess and it did not improve after anesthesia so she was given 60 Toradol//at discharge she was in great discomfort from the abscess and from the Toradol injection without much relief       Assessment & Plan:  Problem #1 abscess back secondary to infected sebaceous cyst Problem #2 pain secondary Problem #3 recurrent sinusitis secondary to allergic rhinitis  Meds ordered this encounter  Medications  . ketorolac (TORADOL) injection 60 mg    Sig:   .  levofloxacin (LEVAQUIN) 500 MG tablet-To cover abscess plus sinusitis    Sig: Take 1 tablet (500 mg total) by mouth daily.    Dispense:  10 tablet    Refill:  0  . montelukast (SINGULAIR) 10 MG tablet-To see if we can reduce the recurrent sinusitis     Sig: Take 1 tablet (10 mg total) by mouth at bedtime.    Dispense:  30 tablet    Refill:  3  . HYDROcodone-acetaminophen (VICODIN) 5-500 MG per tablet    Sig: Take 1 tablet by mouth every 6 (six) hours as needed for pain.    Dispense:  12 tablet    Refill:  0    -  Wound culture sent   -  Continue Flonase and Claritin   -  Use Afrin at bedtime for 3 days   -  Removed wick in 24 hours

## 2011-07-08 ENCOUNTER — Ambulatory Visit (INDEPENDENT_AMBULATORY_CARE_PROVIDER_SITE_OTHER): Payer: Managed Care, Other (non HMO) | Admitting: Internal Medicine

## 2011-07-08 VITALS — BP 134/90 | HR 86 | Temp 97.9°F | Resp 16 | Ht 65.5 in | Wt 166.2 lb

## 2011-07-08 DIAGNOSIS — S21209A Unspecified open wound of unspecified back wall of thorax without penetration into thoracic cavity, initial encounter: Secondary | ICD-10-CM

## 2011-07-08 LAB — WOUND CULTURE
Gram Stain: NONE SEEN
Organism ID, Bacteria: NO GROWTH

## 2011-07-08 NOTE — Progress Notes (Signed)
Followup for infected sebaceous cyst She still is in pain although mild and feels like it's swollen still/she remove the wick  Exam-Able to express cellular debris and serous fluid as wound had closed Tender/no cellulitis/no pus  Impression #1 infected sebaceous cyst She needs more aggressive cleaning at home with heat twice a day for 20 minutes Continue biotics/sinus infections improving

## 2011-07-21 ENCOUNTER — Ambulatory Visit (INDEPENDENT_AMBULATORY_CARE_PROVIDER_SITE_OTHER): Payer: Managed Care, Other (non HMO)

## 2011-07-21 ENCOUNTER — Encounter: Payer: Self-pay | Admitting: Gynecology

## 2011-07-21 ENCOUNTER — Ambulatory Visit (INDEPENDENT_AMBULATORY_CARE_PROVIDER_SITE_OTHER): Payer: BC Managed Care – PPO | Admitting: Gynecology

## 2011-07-21 VITALS — BP 150/90

## 2011-07-21 DIAGNOSIS — R102 Pelvic and perineal pain unspecified side: Secondary | ICD-10-CM

## 2011-07-21 DIAGNOSIS — IMO0002 Reserved for concepts with insufficient information to code with codable children: Secondary | ICD-10-CM

## 2011-07-21 DIAGNOSIS — Z780 Asymptomatic menopausal state: Secondary | ICD-10-CM

## 2011-07-21 DIAGNOSIS — D251 Intramural leiomyoma of uterus: Secondary | ICD-10-CM

## 2011-07-21 DIAGNOSIS — N83201 Unspecified ovarian cyst, right side: Secondary | ICD-10-CM

## 2011-07-21 DIAGNOSIS — D259 Leiomyoma of uterus, unspecified: Secondary | ICD-10-CM

## 2011-07-21 DIAGNOSIS — N952 Postmenopausal atrophic vaginitis: Secondary | ICD-10-CM

## 2011-07-21 DIAGNOSIS — N83209 Unspecified ovarian cyst, unspecified side: Secondary | ICD-10-CM

## 2011-07-21 DIAGNOSIS — N839 Noninflammatory disorder of ovary, fallopian tube and broad ligament, unspecified: Secondary | ICD-10-CM

## 2011-07-21 NOTE — Patient Instructions (Addendum)
Ovarian Cyst The ovaries are small organs that are on each side of the uterus. The ovaries are the organs that produce the female hormones, estrogen and progesterone. An ovarian cyst is a sac filled with fluid that can vary in its size. It is normal for a small cyst to form in women who are in the childbearing age and who have menstrual periods. This type of cyst is called a follicle cyst that becomes an ovulation cyst (corpus luteum cyst) after it produces the women's egg. It later goes away on its own if the woman does not become pregnant. There are other kinds of ovarian cysts that may cause problems and may need to be treated. The most serious problem is a cyst with cancer. It should be noted that menopausal women who have an ovarian cyst are at a higher risk of it being a cancer cyst. They should be evaluated very quickly, thoroughly and followed closely. This is especially true in menopausal women because of the high rate of ovarian cancer in women in menopause. CAUSES AND TYPES OF OVARIAN CYSTS:  FUNCTIONAL CYST: The follicle/corpus luteum cyst is a functional cyst that occurs every month during ovulation with the menstrual cycle. They go away with the next menstrual cycle if the woman does not get pregnant. Usually, there are no symptoms with a functional cyst.   ENDOMETRIOMA CYST: This cyst develops from the lining of the uterus tissue. This cyst gets in or on the ovary. It grows every month from the bleeding during the menstrual period. It is also called a "chocolate cyst" because it becomes filled with blood that turns brown. This cyst can cause pain in the lower abdomen during intercourse and with your menstrual period.   CYSTADENOMA CYST: This cyst develops from the cells on the outside of the ovary. They usually are not cancerous. They can get very big and cause lower abdomen pain and pain with intercourse. This type of cyst can twist on itself, cut off its blood supply and cause severe pain.  It also can easily rupture and cause a lot of pain.   DERMOID CYST: This type of cyst is sometimes found in both ovaries. They are found to have different kinds of body tissue in the cyst. The tissue includes skin, teeth, hair, and/or cartilage. They usually do not have symptoms unless they get very big. Dermoid cysts are rarely cancerous.   POLYCYSTIC OVARY: This is a rare condition with hormone problems that produces many small cysts on both ovaries. The cysts are follicle-like cysts that never produce an egg and become a corpus luteum. It can cause an increase in body weight, infertility, acne, increase in body and facial hair and lack of menstrual periods or rare menstrual periods. Many women with this problem develop type 2 diabetes. The exact cause of this problem is unknown. A polycystic ovary is rarely cancerous.   THECA LUTEIN CYST: Occurs when too much hormone (human chorionic gonadotropin) is produced and over-stimulates the ovaries to produce an egg. They are frequently seen when doctors stimulate the ovaries for invitro-fertilization (test tube babies).   LUTEOMA CYST: This cyst is seen during pregnancy. Rarely it can cause an obstruction to the birth canal during labor and delivery. They usually go away after delivery.  SYMPTOMS   Pelvic pain or pressure.   Pain during sexual intercourse.   Increasing girth (swelling) of the abdomen.   Abnormal menstrual periods.   Increasing pain with menstrual periods.   You stop having   menstrual periods and you are not pregnant.  DIAGNOSIS  The diagnosis can be made during:  Routine or annual pelvic examination (common).   Ultrasound.   X-ray of the pelvis.   CT Scan.   MRI.   Blood tests.  TREATMENT   Treatment may only be to follow the cyst monthly for 2 to 3 months with your caregiver. Many go away on their own, especially functional cysts.   May be aspirated (drained) with a long needle with ultrasound, or by laparoscopy  (inserting a tube into the pelvis through a small incision).   The whole cyst can be removed by laparoscopy.   Sometimes the cyst may need to be removed through an incision in the lower abdomen.   Hormone treatment is sometimes used to help dissolve certain cysts.   Birth control pills are sometimes used to help dissolve certain cysts.  HOME CARE INSTRUCTIONS  Follow your caregiver's advice regarding:  Medicine.   Follow up visits to evaluate and treat the cyst.   You may need to come back or make an appointment with another caregiver, to find the exact cause of your cyst, if your caregiver is not a gynecologist.   Get your yearly and recommended pelvic examinations and Pap tests.   Let your caregiver know if you have had an ovarian cyst in the past.  SEEK MEDICAL CARE IF:   Your periods are late, irregular, they stop, or are painful.   Your stomach (abdomen) or pelvic pain does not go away.   Your stomach becomes larger or swollen.   You have pressure on your bladder or trouble emptying your bladder completely.   You have painful sexual intercourse.   You have feelings of fullness, pressure, or discomfort in your stomach.   You lose weight for no apparent reason.   You feel generally ill.   You become constipated.   You lose your appetite.   You develop acne.   You have an increase in body and facial hair.   You are gaining weight, without changing your exercise and eating habits.   You think you are pregnant.  SEEK IMMEDIATE MEDICAL CARE IF:   You have increasing abdominal pain.   You feel sick to your stomach (nausea) and/or vomit.   You develop a fever that comes on suddenly.   You develop abdominal pain during a bowel movement.   Your menstrual periods become heavier than usual.  Document Released: 01/06/2005 Document Revised: 12/26/2010 Document Reviewed: 11/09/2008 [employer] Patient Information 2012 [employer]. 

## 2011-07-21 NOTE — Progress Notes (Signed)
Patient is a 51 year old who presented to the office today for followup. Please see note dated May 28 for detail. Patient been complaining for several months and sensitivity and discomfort during intercourse in her vagina. She denied any vaginal discharge. She had not had a menstrual cycle in several years. Recently did an [facility] here in the office which indicated that she was indeed menopausal with a value of 96.4. She hasn't yet not done her mammogram. She was originated placed on Estrace vaginal cream for 10 days and then she started on elestrin 0.06% transdermally one pump application to one arm daily with the addition of Prometrium 200 mg for 12 days of the month. She states that her symptoms and started to improve. She's here for an ultrasound for complete evaluation.  Ultrasound: Uterus measured 9.0 x 6.2 x 4.2 cm with endometrial stripe of 1.8 mm intramural myomas were noted for a total of 4 the largest one measuring 24 x 21 mm. Right ovary with thinwall cyst measured 15 x 11 x 40 mm with a septum an echogenic focus at the wall of this cyst negative color flow left ovary was otherwise normal.  Assessment/plan: Vaginal discomfort probably attributed as part of her menopausal symptoms as confirmed by an elevated FSH and improvement on hormone replacement therapy as described above. Requisition was provided her to schedule her mammogram. She was encouraged to do her monthly self breast examination. She'll return back to the office in 3 months for followup ultrasound to followup with a small right ovarian cyst.

## 2011-09-22 ENCOUNTER — Ambulatory Visit (INDEPENDENT_AMBULATORY_CARE_PROVIDER_SITE_OTHER): Payer: Managed Care, Other (non HMO) | Admitting: Internal Medicine

## 2011-09-22 VITALS — BP 150/98 | HR 82 | Temp 98.2°F | Resp 16 | Ht 65.5 in | Wt 162.4 lb

## 2011-09-22 DIAGNOSIS — J309 Allergic rhinitis, unspecified: Secondary | ICD-10-CM

## 2011-09-22 DIAGNOSIS — J019 Acute sinusitis, unspecified: Secondary | ICD-10-CM

## 2011-09-22 DIAGNOSIS — F329 Major depressive disorder, single episode, unspecified: Secondary | ICD-10-CM

## 2011-09-22 DIAGNOSIS — I10 Essential (primary) hypertension: Secondary | ICD-10-CM

## 2011-09-22 DIAGNOSIS — J45909 Unspecified asthma, uncomplicated: Secondary | ICD-10-CM

## 2011-09-22 DIAGNOSIS — G43909 Migraine, unspecified, not intractable, without status migrainosus: Secondary | ICD-10-CM

## 2011-09-22 DIAGNOSIS — E890 Postprocedural hypothyroidism: Secondary | ICD-10-CM

## 2011-09-22 MED ORDER — CALCITRIOL 0.25 MCG PO CAPS: 0.5000 ug | ORAL_CAPSULE | Freq: Every day | ORAL | Status: DC

## 2011-09-22 MED ORDER — MONTELUKAST SODIUM 10 MG PO TABS: 10.0000 mg | ORAL_TABLET | Freq: Every day | ORAL | Status: DC

## 2011-09-22 MED ORDER — CLONAZEPAM 1 MG PO TABS: 1.0000 mg | ORAL_TABLET | Freq: Two times a day (BID) | ORAL | Status: DC | PRN

## 2011-09-22 MED ORDER — METOPROLOL SUCCINATE ER 50 MG PO TB24: 50.0000 mg | ORAL_TABLET | Freq: Every day | ORAL | Status: DC

## 2011-09-22 MED ORDER — DESVENLAFAXINE SUCCINATE ER 100 MG PO TB24: 100.0000 mg | ORAL_TABLET | Freq: Every day | ORAL | Status: DC

## 2011-09-22 MED ORDER — TRIAMTERENE-HCTZ 37.5-25 MG PO CAPS: 1.0000 | ORAL_CAPSULE | ORAL | Status: DC

## 2011-09-22 MED ORDER — LEVOTHYROXINE SODIUM 200 MCG PO TABS: 200.0000 ug | ORAL_TABLET | Freq: Every day | ORAL | Status: DC

## 2011-09-22 MED ORDER — AMOXICILLIN-POT CLAVULANATE 875-125 MG PO TABS: 1.0000 | ORAL_TABLET | Freq: Two times a day (BID) | ORAL | Status: AC

## 2011-09-22 MED ORDER — RIZATRIPTAN BENZOATE 10 MG PO TABS: 10.0000 mg | ORAL_TABLET | ORAL | Status: DC | PRN

## 2011-09-22 NOTE — Progress Notes (Signed)
Subjective:    Patient ID: [name], female    DOB: 1960-12-22, 51 y.o.   MRN: [id number]  HPIComplaining of congestion with purulent discharge and pain around the left eye for one week Started with an allergy attack No wheezing/no fever  Has started back teaching Needs medication refills for problems  Patient Active Problem List  Diagnosis  .   Marland Kitchen Hypertension-Dyazide  . Allergic rhinitis, cause unspecified  . Reactive airway disease  . Depressed--Doing well on Thursday with Klonopin for anxiety  . Migraine-Maxalt as needed  . Thyroid cancer  . Hypothyroidism, postsurgical-Dr. Lawerance Bach has referred her back to [place] to follow/and she is now 4 years post surgery for thyroid cancer with no recurrence in stable hormone levels on replacement therapy  . Menopause--Recently started on hormones by Dr. Lily Peer for vaginal dyspareunia and dryness       Review of Systems Continues with weight loss From 200 pounds to 162 by diet and exercise No vision changes No chest pain or palpitations No gastrointestinal symptoms Genitourinary negative Neurological negative Psychiatric stable   Objective:   Physical Exam  Blood pressure 150/98 HEENT= TMs clear/pupils equal round reactive to light and accommodation/EOMs conjugate Nares boggy/purulence on the left/tender left maxillary area to percussion Throat clear No nodes Chest clear Heart regular without murmur No peripheral edema/full peripheral pulses      Assessment & Plan:   Patient Active Problem List  Diagnosis  . Acute sinusitis  . Hypertension  . Allergic rhinitis, cause unspecified  . Reactive airway disease  . Depressed  . Migraine  . Thyroid cancer  . Hypothyroidism, postsurgical  . Menopause   Meds ordered this encounter  Medications  . amoxicillin-clavulanate (AUGMENTIN) 875-125 MG per tablet    Sig: Take 1 tablet by mouth 2 (two) times daily.    Dispense:  20 tablet    Refill:  1  . desvenlafaxine  (PRISTIQ) 100 MG 24 hr tablet    Sig: Take 1 tablet (100 mg total) by mouth daily.    Dispense:  90 tablet    Refill:  3  . rizatriptan (MAXALT) 10 MG tablet    Sig: Take 1 tablet (10 mg total) by mouth as needed for migraine. May repeat in 2 hours if needed    Dispense:  12 tablet    Refill:  3  . metoprolol succinate (TOPROL-XL) 50 MG 24 hr tablet    Sig: Take 1 tablet (50 mg total) by mouth daily.    Dispense:  90 tablet    Refill:  3  . levothyroxine (SYNTHROID, LEVOTHROID) 200 MCG tablet    Sig: Take 1 tablet (200 mcg total) by mouth daily.    Dispense:  90 tablet    Refill:  3  . montelukast (SINGULAIR) 10 MG tablet    Sig: Take 1 tablet (10 mg total) by mouth at bedtime.    Dispense:  90 tablet    Refill:  3  . clonazePAM (KLONOPIN) 1 MG tablet    Sig: Take 1 tablet (1 mg total) by mouth 2 (two) times daily as needed.    Dispense:  180 tablet    Refill:  1  . triamterene-hydrochlorothiazide (DYAZIDE) 37.5-25 MG per capsule    Sig: Take 1 each (1 capsule total) by mouth every morning.    Dispense:  90 capsule    Refill:  3  . calcitRIOL (ROCALTROL) 0.25 MCG capsule    Sig: Take 2 capsules (0.5 mcg total) by mouth daily.  Dispense:  180 capsule    Refill:  3  Follow blood pressure closely at home after well and followup if elevated

## 2011-09-26 ENCOUNTER — Telehealth: Payer: Self-pay

## 2011-09-26 MED ORDER — FLUCONAZOLE 150 MG PO TABS: 150.0000 mg | ORAL_TABLET | Freq: Once | ORAL | Status: AC

## 2011-09-26 NOTE — Telephone Encounter (Signed)
Rx sent to pharmacy   

## 2011-09-26 NOTE — Telephone Encounter (Signed)
Pt states since taking antibiotic she has developed yeast infection,please call meds in for same.   Best phone [phone] [facility]acy [facility] [place]

## 2011-09-26 NOTE — Telephone Encounter (Signed)
Called patient to advise  °

## 2011-09-29 ENCOUNTER — Telehealth: Payer: Self-pay

## 2011-09-29 NOTE — Telephone Encounter (Signed)
Patient states she is currently having a herpes outbreak and is in a lot of pain on mouth and bottom. Would like something called in for this. Will happily come see Dr. Merla Riches Friday when he is back in the office. Please call at [phone]

## 2011-09-30 MED ORDER — VALACYCLOVIR HCL 500 MG PO TABS: 500.0000 mg | ORAL_TABLET | Freq: Two times a day (BID) | ORAL | Status: DC

## 2011-09-30 NOTE — Telephone Encounter (Signed)
LMOM notifying patient rx sent in. 

## 2011-09-30 NOTE — Telephone Encounter (Signed)
Patient's chart is pulled and in the pa pool.  [facility] [id number]

## 2011-09-30 NOTE — Telephone Encounter (Signed)
Valtrex sent in

## 2011-10-23 ENCOUNTER — Other Ambulatory Visit: Payer: Self-pay | Admitting: Internal Medicine

## 2011-10-23 NOTE — Telephone Encounter (Signed)
Please pull paper chart. Medication not on med list in [facility].

## 2011-10-24 NOTE — Telephone Encounter (Signed)
Chart pulled to PA pool pile at nurse station [phone]

## 2011-11-07 ENCOUNTER — Other Ambulatory Visit: Payer: Self-pay | Admitting: Physician Assistant

## 2011-11-07 NOTE — Telephone Encounter (Signed)
It looks like at her 09/22/11 appointment with Dr. Merla Riches she was prescribed 90 tabs of Pristiq with 3 refills, so patient should be good for 9 months.  Please call patient and clarify

## 2011-11-11 ENCOUNTER — Other Ambulatory Visit: Payer: Self-pay | Admitting: Physician Assistant

## 2011-11-11 NOTE — Telephone Encounter (Signed)
Please call the pharmacy.  On 09/22/2011 Dr. Merla Riches Rx'd #12, RF x 3.  Has the patient used all these?  If so, may have #12, NO RF.  If not, please use those.

## 2011-11-19 ENCOUNTER — Other Ambulatory Visit: Payer: Self-pay | Admitting: Physician Assistant

## 2011-12-13 ENCOUNTER — Ambulatory Visit (INDEPENDENT_AMBULATORY_CARE_PROVIDER_SITE_OTHER): Payer: Managed Care, Other (non HMO) | Admitting: Emergency Medicine

## 2011-12-13 VITALS — BP 155/102 | HR 89 | Temp 98.4°F | Resp 16 | Ht 64.5 in | Wt 164.8 lb

## 2011-12-13 DIAGNOSIS — N952 Postmenopausal atrophic vaginitis: Secondary | ICD-10-CM

## 2011-12-13 DIAGNOSIS — N760 Acute vaginitis: Secondary | ICD-10-CM

## 2011-12-13 DIAGNOSIS — N898 Other specified noninflammatory disorders of vagina: Secondary | ICD-10-CM

## 2011-12-13 LAB — POCT WET PREP WITH KOH

## 2011-12-13 MED ORDER — METRONIDAZOLE 500 MG PO TABS: 500.0000 mg | ORAL_TABLET | Freq: Two times a day (BID) | ORAL | Status: DC

## 2011-12-13 MED ORDER — ESTRADIOL 0.1 MG/GM VA CREA: TOPICAL_CREAM | VAGINAL | Status: DC

## 2011-12-13 NOTE — Progress Notes (Signed)
   [address], [place] [address]   Phone [phone]  Subjective:    Patient ID: [name], female    DOB: 08-27-1960, 51 y.o.   MRN: [id number]  HPI Pt presents to clinic with vaginal irritation for the last week.  She has noticed a vaginal d/c, esp on her underwear.  She has changed nothing in her life, soap, detergents, lotions.  No new sexual partners.  She is having some mild abd cramping, no vaginal odor and no urinary symptoms.  She has had some problems with vaginal dryness and was put on estrogen topical and progesterone but does not feel like it is helping, she was an a vaginal cream and that did help but then she was taken off of that.   Review of Systems  Genitourinary: Positive for vaginal discharge and vaginal pain (irriation). Negative for dysuria, urgency, frequency and vaginal bleeding.       Objective:   Physical Exam  Vitals reviewed. Constitutional: She is oriented to person, place, and time. She appears well-developed and well-nourished.  Cardiovascular: Normal rate, regular rhythm and normal heart sounds.   Pulmonary/Chest: Effort normal.  Genitourinary: Pelvic exam was performed with patient supine. There is no rash, tenderness, lesion or injury on the right labia. There is no rash, tenderness, lesion or injury on the left labia. Cervix exhibits no motion tenderness and no discharge. No tenderness around the vagina. No signs of injury around the vagina. Vaginal discharge (mild vaginal d/c) found.       Pale vaginal mucosa, with minimal d/c  Neurological: She is alert and oriented to person, place, and time.  Skin: Skin is warm and dry.  Psychiatric: She has a normal mood and affect. Her behavior is normal. Judgment and thought content normal.   Results for orders placed in visit on 12/13/11  POCT WET PREP WITH KOH      Component Value Range   Trichomonas, UA Negative     Clue Cells Wet Prep HPF POC 0-2     Epithelial Wet Prep HPF POC 2-4     Yeast  Wet Prep HPF POC negative     Bacteria Wet Prep HPF POC 1+     RBC Wet Prep HPF POC 0-2     WBC Wet Prep HPF POC 8-12     KOH Prep POC Negative            Assessment & Plan:   1. Vaginal discharge  POCT Wet Prep with KOH  2. BV (bacterial vaginosis)  metroNIDAZOLE (FLAGYL) 500 MG tablet  3. Vaginal atrophy  estradiol (ESTRACE VAGINAL) 0.1 MG/GM vaginal cream   Mild BV but will treat because of abd cramping.  I really think the patient is experiencing vaginal dryness due to low estrogen and vaginal atrophy.  Will change to a vaginal cream and see if her symptoms respond.  Will stop her estrogen transdermal and progesterone.  Gave her 2 months and then will recheck her back.  D/w pt that she should have her yearly mammo and vaginal exam due to small amount of systemic estrogen absorption.  D/w Dr. Cleta Alberts.

## 2012-02-04 ENCOUNTER — Ambulatory Visit: Payer: Self-pay | Admitting: Internal Medicine

## 2012-02-04 VITALS — BP 166/97 | HR 89 | Temp 97.8°F | Resp 16 | Ht 65.0 in | Wt 170.0 lb

## 2012-02-04 DIAGNOSIS — B009 Herpesviral infection, unspecified: Secondary | ICD-10-CM

## 2012-02-04 DIAGNOSIS — J329 Chronic sinusitis, unspecified: Secondary | ICD-10-CM

## 2012-02-04 MED ORDER — AMOXICILLIN-POT CLAVULANATE 875-125 MG PO TABS: 1.0000 | ORAL_TABLET | Freq: Two times a day (BID) | ORAL | Status: DC

## 2012-02-04 MED ORDER — ACYCLOVIR 200 MG PO CAPS: 400.0000 mg | ORAL_CAPSULE | Freq: Two times a day (BID) | ORAL | Status: DC

## 2012-02-04 MED ORDER — VALACYCLOVIR HCL 1 G PO TABS: 1000.0000 mg | ORAL_TABLET | Freq: Every day | ORAL | Status: DC

## 2012-02-05 NOTE — Progress Notes (Signed)
  Subjective:    Patient ID: [name], female    DOB: 03-12-1960, 52 y.o.   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

## 2012-04-29 ENCOUNTER — Telehealth: Payer: Self-pay

## 2012-04-29 NOTE — Telephone Encounter (Signed)
Pt is on anti depressant and Dr Merla Riches an Pt discussed about changing medication, and she is wanting to do so. If someone can give her a call back :)

## 2012-04-30 NOTE — Telephone Encounter (Signed)
Called patient. Left message for her to call me back.  

## 2012-04-30 NOTE — Telephone Encounter (Signed)
Pt CB and reported that when she was discussing meds w/Dr Merla Riches at [facility] they talked about possibly switching her to Effexor XR d/t cost of Pristiq. Pt also thinks the Pristiq is maybe losing some effectiveness d/t length of time she has taken it. Dr Yong Channel, do you want to Rx the Effexor XR for pt?

## 2012-05-01 MED ORDER — VENLAFAXINE HCL ER 150 MG PO CP24: 150.0000 mg | ORAL_CAPSULE | Freq: Every day | ORAL | Status: DC

## 2012-05-01 NOTE — Telephone Encounter (Signed)
Can switch directly Meds ordered this encounter  Medications  . venlafaxine XR (EFFEXOR-XR) 150 MG 24 hr capsule    Sig: Take 1 capsule (150 mg total) by mouth daily.    Dispense:  30 capsule    Refill:  5

## 2012-05-02 NOTE — Telephone Encounter (Signed)
Spoke with pt advised Rx changed. Pt understood

## 2012-05-14 ENCOUNTER — Other Ambulatory Visit: Payer: Self-pay | Admitting: Internal Medicine

## 2012-05-16 NOTE — Telephone Encounter (Signed)
Meds ordered this encounter  Medications  . clonazePAM (KLONOPIN) 1 MG tablet    Sig: TAKE 1 TABLET BY MOUTH TWICE A DAY AS NEEDED    Dispense:  180 tablet    Refill:  0

## 2012-05-17 ENCOUNTER — Other Ambulatory Visit: Payer: Self-pay | Admitting: Radiology

## 2012-05-17 NOTE — Telephone Encounter (Signed)
faxed

## 2012-05-22 ENCOUNTER — Encounter: Payer: Self-pay | Admitting: Physician Assistant

## 2012-05-22 ENCOUNTER — Ambulatory Visit (INDEPENDENT_AMBULATORY_CARE_PROVIDER_SITE_OTHER): Payer: BC Managed Care – PPO | Admitting: Internal Medicine

## 2012-05-22 VITALS — BP 165/102 | HR 74 | Temp 98.3°F | Resp 16 | Ht 64.5 in | Wt 175.4 lb

## 2012-05-22 DIAGNOSIS — IMO0002 Reserved for concepts with insufficient information to code with codable children: Secondary | ICD-10-CM

## 2012-05-22 DIAGNOSIS — N898 Other specified noninflammatory disorders of vagina: Secondary | ICD-10-CM

## 2012-05-22 DIAGNOSIS — N899 Noninflammatory disorder of vagina, unspecified: Secondary | ICD-10-CM

## 2012-05-22 DIAGNOSIS — N760 Acute vaginitis: Secondary | ICD-10-CM

## 2012-05-22 DIAGNOSIS — K1370 Unspecified lesions of oral mucosa: Secondary | ICD-10-CM

## 2012-05-22 DIAGNOSIS — B370 Candidal stomatitis: Secondary | ICD-10-CM

## 2012-05-22 DIAGNOSIS — L0100 Impetigo, unspecified: Secondary | ICD-10-CM

## 2012-05-22 DIAGNOSIS — K1379 Other lesions of oral mucosa: Secondary | ICD-10-CM

## 2012-05-22 LAB — POCT WET PREP WITH KOH
Clue Cells Wet Prep HPF POC: POSITIVE
Trichomonas, UA: NEGATIVE

## 2012-05-22 LAB — POCT SKIN KOH: Skin KOH, POC: NEGATIVE

## 2012-05-22 MED ORDER — MUPIROCIN 2 % EX OINT: TOPICAL_OINTMENT | Freq: Three times a day (TID) | CUTANEOUS | Status: DC

## 2012-05-22 MED ORDER — METRONIDAZOLE 500 MG PO TABS: 500.0000 mg | ORAL_TABLET | Freq: Two times a day (BID) | ORAL | Status: DC

## 2012-05-22 MED ORDER — CLOTRIMAZOLE 10 MG MT LOZG: 10.0000 mg | LOZENGE | Freq: Every day | OROMUCOSAL | Status: DC

## 2012-05-22 NOTE — Progress Notes (Signed)
Subjective:    Patient ID: [name], female    DOB: 12/02/1960, 52 y.o.   MRN: [id number]  HPI   [name] is a very pleasant 52 yr old female here with several concerns today.   (1)  "Something is going on in my mouth."  This has been present for a couple of days.  She describes it as a rough feeling on the roof of her mouth.  "Patches of something."  This is affecting her sense of taste.  It hurts to eat.  Does not recall anything like this before.  Denies any trauma.  Has not seen any visible sores.  Pt denies throat pain, but says she did have a sore throat a couple days ago.  Of note, pt with history of HSV oral lesions, using acyclovir for suppression.    (2)  Has noted some vaginal itching, irritation for the last couple of days.  Not sure if this is related the the issue with her mouth.  Has noted some discharge, more than usual.  LMP 8-9 yrs ago.  Feels like "it's a whole new world down there" post-menopause.  Had an episode of BV about 6 months ago.  Symptoms today feel similar to that episode.  No new sexual partners or concerns for STI, but "you never know."  Pt would like testing today.    (3)  Has noted some redness around belly button for about a week.  Started as an itch but is no real red, "almost feverish feeling."  Tried topical cortisone, monistat, and neosporin without relief.  It is painful and crusting.  May be draining, but she's not sure.    Denies fevers or chills, but does have hot flashes.  Some nausea, but no vomiting.  No abd pain or trouble swallowing.  Of note, BP is elevated today to 165/102 at triage.  Pt states that her BP tends to be elevated when she comes to the doctor.  Home BPs with 137-145 systolic.  Currently taking Toprol and Dyazide.   Review of Systems  Constitutional: Negative for fever and chills.  HENT: Positive for mouth sores.   Respiratory: Negative.   Cardiovascular: Negative.   Gastrointestinal: Negative.   Genitourinary: Positive  for vaginal discharge. Negative for dysuria, frequency, hematuria, flank pain and genital sores.  Musculoskeletal: Negative.   Skin: Positive for rash. Wound: belly  button.  Neurological: Negative.        Objective:   Physical Exam  Vitals reviewed. Constitutional: She is oriented to person, place, and time. She appears well-developed and well-nourished. No distress.  HENT:  Head: Normocephalic and atraumatic.  Right Ear: Tympanic membrane and ear canal normal.  Left Ear: Tympanic membrane and ear canal normal.  Mouth/Throat: Oropharynx is clear and moist and mucous membranes are normal. Oral lesions (very small, whitish lesions on roof of mouth; no erythema; not friable) present.    Eyes: Conjunctivae are normal. No scleral icterus.  Neck: Neck supple.  Cardiovascular: Normal rate, regular rhythm and normal heart sounds.  Exam reveals no gallop and no friction rub.   No murmur heard. Pulmonary/Chest: Effort normal and breath sounds normal. She has no wheezes. She has no rales.  Abdominal: Soft. There is no tenderness.  Genitourinary: Uterus normal. There is no rash, tenderness or lesion on the right labia. There is no rash, tenderness or lesion on the left labia. Cervix exhibits no motion tenderness, no discharge and no friability. Right adnexum displays no mass, no tenderness and no  fullness. Left adnexum displays no mass, no tenderness and no fullness. Vaginal discharge (small amount, thick, white) found.  Neurological: She is alert and oriented to person, place, and time.  Skin: Skin is warm and dry. Lesion noted.     Erythema of umbilicus with some honey-colored crusting; no surrounding erythema or induration, no drainage  Psychiatric: She has a normal mood and affect. Her behavior is normal.    Filed Vitals:   05/22/12 0937  BP: 165/102  Pulse: 74  Temp: 98.3 F (36.8 C)  Resp: 16   BP 158/96 on recheck   Results for orders placed in visit on 05/22/12  POCT WET  PREP WITH KOH      Result Value Range   Trichomonas, UA Negative     Clue Cells Wet Prep HPF POC positive     Epithelial Wet Prep HPF POC 5-10     Yeast Wet Prep HPF POC neg     Bacteria Wet Prep HPF POC moderate     RBC Wet Prep HPF POC neg     WBC Wet Prep HPF POC 10-15     KOH Prep POC Negative    POCT SKIN KOH      Result Value Range   Skin KOH, POC Negative         Assessment & Plan:  Thrush - Plan: clotrimazole (MYCELEX) 10 MG troche  --  Pt with several days of mouth irritation/pain/itching.  KOH neg but lesions on roof of mouth appear consistent with thrush.  Will treat with clotrimazole lozenges x 7 days.   Mouth sore - Plan: POCT Skin KOH, clotrimazole (MYCELEX) 10 MG troche  -- See above  Impetigo - Plan: Wound culture, mupirocin ointment (BACTROBAN) 2 %  --  Pt with erythema, honey-colored crusting at umbilicus.  Appears consistent with impetigo.  Given that involvement is localized to umbilicus only and there is no ulceration or associated cellulitis, will treat with topical mupirocin only.  May have to add oral abx if not resolving.  Bacterial vaginosis  -- Clue cells present on wet prep.  Pt has had BV once before approx 6months ago.  Will treat with Flagyl x 7 days.  Discussed avoidance of etoh.  Pt inquires as to weather she could have a refill on Flagyl in case she develops BV again.  Discussed the importance of returning to clinic for an exam given similar presentation of uro/gyn problems.  Discussed that should she develop another BV infection, we may want to consider long term maintenance therapy with either boric acid or metrogel.  Pt understands and is in agreement with this.  Vaginal irritation - Plan: POCT Wet Prep with KOH, GC/Chlamydia Probe Amp, metroNIDAZOLE (FLAGYL) 500 MG tablet  -- See above  Discussed RTC precautions with pt.  If any symptoms are worsening or not improving, pt will let us know.    Case discussed with Dr. Perrin Maltese who also examined the  pt

## 2012-05-22 NOTE — Patient Instructions (Addendum)
Begin using the lozenges 5 times per day - let them dissolve slowly.  Do this for 1 week, if symptoms have not resolved, do for another week.  Mupirocin ointment to the belly button three times per day for 1 week.  Metronidazole (Flagyl) twice daily for 7 days.  DO NOT CONSUME ALCOHOL WITH THIS MEDICATION or for 48 hours after your last dose.  If any of your symptoms are worsening or not improving, please let us know.   Thrush, Adult  Ginette Pitman is a yeast infection that develops in the mouth and throat and on the tongue. The medical term for this is oropharyngeal candidiasis, or OPC. Ginette Pitman is most common in older adults, but it can occur at any age. Ginette Pitman occurs when a yeast called candida grows out of control. Candida normally is present in small amounts in the mouth and on other mucous membranes. However, under certain circumstances, candida can grow rapidly, causing thrush. Ginette Pitman can be a recurring problem for people who have chronic illnesses or who take medications that limit the body's ability to fight infection (weakened immune system). Since these people have difficulty fighting infections, the fungus that causes thrush can spread throughout the body. This can cause life-threatening blood or organ infections. CAUSES  Candida, the yeast that causes thrush, is normally present in small amounts in the mouth and on other mucous membranes. It usually causes no harm. However, when conditions are present that allow the yeast to grow uncontrolled, it invades surrounding tissues and becomes an infection. Ginette Pitman is most commonly caused by the yeast Candida albicans. Less often, other forms of candida can lead to thrush. There are many types of bacteria in your mouth that normally control the growth of candida. Sometimes a new type of bacteria gets into your mouth and disrupts the balance of the germs already there. This can allow candida to overgrow. Other factors that increase your risk of developing  thrush include:  An impaired ability to fight infection (weakened immune system). A normal immune system is usually strong enough to prevent candida from overgrowing.  Older adults are more likely to develop thrush because they may have weaker immune systems.  People with human immunodeficiency virus (HIV) infection have a high likelihood of developing thrush. About 90% of people with HIV develop thrush at some point during the course of their disease.  People with diabetes are more likely to get thrush because high blood sugar levels promote overgrowth of the candida fungus.  A dry mouth (xerostomia). Dry mouth can result from overuse of mouthwashes or from certain conditions such as Sjgren's syndrome.  Pregnancy. Hormone changes during pregnancy can lead to thrush by altering the balance of bacteria in the mouth.  Poor dental care, especially in people who have false teeth.  The use of antibiotic medications. This may lead to thrush by changing the balance of bacteria in the mouth. SYMPTOMS  Thrush can be a mild infection that causes no symptoms. If symptoms develop, they may include the following:  A burning feeling in the mouth and throat. This can occur at the start of a thrush infection.  White patches that adhere to the mouth and tongue. The tissue around the patches may be red, raw, and painful. If rubbed (during tooth brushing, for example), the patches and the tissue of the mouth may bleed easily.  A bad taste in the mouth or difficulty tasting foods.  Cottony feeling in the mouth.  Sometimes pain during eating and swallowing. DIAGNOSIS  Your caregiver can usually diagnose thrush by exam. In addition to looking in your mouth, your caregiver will ask you questions about your health. TREATMENT  Medications that help prevent the growth of fungi (antifungals) are the standard treatment for thrush. These medications are either applied directly to the affected area (topical) or  swallowed (oral). Mild thrush In adults, mild cases of thrush may clear up with simple treatment that can be done at home. This treatment usually involves using an antifungal mouth rinse or lozenges. Treatment usually lasts about 14 days. Moderate to severe thrush  More severe thrush infections that have spread to the esophagus are treated with an oral antifungal medication. A topical antifungal medication may also be used.  For some severe infections, a treatment period longer than 14 days may be needed.  Oral antifungal medications are almost never used during pregnancy because the fetus may be harmed. However, if a pregnant woman has a rare, severe thrush infection that has spread to her blood, oral antifungal medications may be used. In this case, the risk of harm to the mother and fetus from the severe thrush infection may be greater than the risk posed by the use of antifungal medications. Persistent or recurrent thrush Persistent (does not go away) or recurrent (keeps coming back) cases of thrush may:  Need to be treated twice as long as the symptoms last.  Require treatment with both oral and topical antifungal medications.  People with weakened immune systems can take an antifungal medication on a continuous basis to prevent thrush infections. It is important to treat conditions that make you more likely to get thrush, such as diabetes, human immunodeficiency virus (HIV), or cancer.  HOME CARE INSTRUCTIONS   If you are breast-feeding, you should clean your nipples with an antifungal medication, such as nystatin (Mycostatin). Dry your nipples after breast-feeding. Applying lanolin-containing body lotion may help relieve nipple soreness.  If you wear dentures and get thrush, remove dentures before going to bed, brush them vigorously, and soak in a solution of chlorhexidine gluconate or a product such as Polident or Efferdent.  Eating plain, unflavored yogurt that contains live  cultures (check the label) can also help cure thrush. Yogurt helps healthy bacteria grow in the mouth. These bacteria stop the growth of the yeast that causes thrush.  Adults can treat thrush at home with gentian violet (1%), a dye that kills bacteria and fungi. It is available without a prescription. If there is no known cause for the infection or if gentian violet does not cure the thrush, you need to see your caregiver. Comfort measures Measures can be taken to reduce the discomfort of thrush:  Drink cold liquids such as water or iced tea. Eat flavored ice treats or frozen juices.  Eat foods that are easy to swallow such as gelatin, ice cream, or custard.  If the patches are painful, try drinking from a straw.  Rinse your mouth several times a day with a warm saltwater rinse. You can make the saltwater mixture with 1 tsp (5 g) of salt in 8 fl oz (0.2 L) of warm water. PROGNOSIS   Most cases of thrush are mild and clear up with the use of an antifungal mouth rinse or lozenges. Very mild cases of thrush may clear up without medical treatment. It usually takes about 14 days of treatment with an oral antifungal medication to cure more severe thrush infections. In some cases, thrush may last several weeks even with treatment.  If thrush  goes untreated and does not go away by itself, it can spread to other parts of the body.  Thrush can spread to the throat, the vagina, or the skin. It rarely spreads to other organs of the body. Ginette Pitman is more likely to recur (come back) in:  People who use inhaled corticosteroids to treat asthma.  People who take antibiotic medications for a long time.  People who have false teeth.  People who have a weakened immune system. RISKS AND COMPLICATIONS Complications related to thrush are rare in healthy people. There are several factors that can increase your risk of developing thrush. Age Older adults, especially those who have serious health problems, are  more likely to develop thrush because their immune systems are likely to be weaker. Behavior  The yeast that causes thrush can be spread by oral sex.  Heavy smoking can lower the body's ability to fight off infections. This makes thrush more likely to develop. Other conditions  False teeth (dentures), braces, or a retainer that irritates the mouth make it hard to keep the mouth clean. An unclean mouth is more likely to develop thrush than a clean mouth.  People with a weakened immune system, such as those who have diabetes or human immunodeficiency virus (HIV) or who are undergoing chemotherapy, have an increased risk for developing thrush. Medications Some medications can allow the fungus that causes thrush to grow uncontrolled. Common ones are:  Antibiotics, especially those that kill a wide range of organisms (broad-spectrum antibiotics), such as tetracycline commonly can cause thrush.  Birth control pills (oral contraceptives).  Medications that weaken the body's immune system, such as corticosteroids. Environment Exposure over time to certain environmental chemicals, such as benzene and pesticides, can weaken the body's immune system. This increases your risk for developing infections, including thrush. SEEK IMMEDIATE MEDICAL CARE IF:  Your symptoms are getting worse or are not improving within 7 days of starting treatment.  You have symptoms of spreading infection, such as white patches on the skin outside of the mouth.  You are nursing and you have redness and pain in the nipples in spite of home treatment or if you have burning pain in the nipple area when you nurse. Your baby's mouth should also be examined to determine whether thrush is causing your symptoms. Document Released: 10/02/2003 Document Revised: 03/31/2011 Document Reviewed: 01/12/2008 [facility] Patient Information 2013 [place], [place].   Impetigo Impetigo is an infection of the skin, most common in babies and  children.  CAUSES  It is caused by staphylococcal or streptococcal germs (bacteria). Impetigo can start after any damage to the skin. The damage to the skin may be from things like:   Chickenpox.  Scrapes.  Scratches.  Insect bites (common when children scratch the bite).  Cuts.  Nail biting or chewing. Impetigo is contagious. It can be spread from one person to another. Avoid close skin contact, or sharing towels or clothing. SYMPTOMS  Impetigo usually starts out as small blisters or pustules. Then they turn into tiny yellow-crusted sores (lesions).  There may also be:  Large blisters.  Itching or pain.  Pus.  Swollen lymph glands. With scratching, irritation, or non-treatment, these small areas may get larger. Scratching can cause the germs to get under the fingernails; then scratching another part of the skin can cause the infection to be spread there. DIAGNOSIS  Diagnosis of impetigo is usually made by a physical exam. A skin culture (test to grow bacteria) may be done to prove the diagnosis  or to help decide the best treatment.  TREATMENT  Mild impetigo can be treated with prescription antibiotic cream. Oral antibiotic medicine may be used in more severe cases. Medicines for itching may be used. HOME CARE INSTRUCTIONS   To avoid spreading impetigo to other body areas:  Keep fingernails short and clean.  Avoid scratching.  Cover infected areas if necessary to keep from scratching.  Gently wash the infected areas with antibiotic soap and water.  Soak crusted areas in warm soapy water using antibiotic soap.  Gently rub the areas to remove crusts. Do not scrub.  Wash hands often to avoid spread this infection.  Keep children with impetigo home from school or daycare until they have used an antibiotic cream for 48 hours (2 days) or oral antibiotic medicine for 24 hours (1 day), and their skin shows significant improvement.  Children may attend school or daycare if  they only have a few sores and if the sores can be covered by a bandage or clothing. SEEK MEDICAL CARE IF:   More blisters or sores show up despite treatment.  Other family members get sores.  Rash is not improving after 48 hours (2 days) of treatment. SEEK IMMEDIATE MEDICAL CARE IF:   You see spreading redness or swelling of the skin around the sores.  You see red streaks coming from the sores.  Your child develops a fever of 100.4 F (37.2 C) or higher.  Your child develops a sore throat.  Your child is acting ill (lethargic, sick to their stomach). Document Released: 01/04/2000 Document Revised: 03/31/2011 Document Reviewed: 11/03/2007 [facility] Patient Information 2013 [place], [place].   Bacterial Vaginosis Bacterial vaginosis (BV) is a vaginal infection where the normal balance of bacteria in the vagina is disrupted. The normal balance is then replaced by an overgrowth of certain bacteria. There are several different kinds of bacteria that can cause BV. BV is the most common vaginal infection in women of childbearing age. CAUSES   The cause of BV is not fully understood. BV develops when there is an increase or imbalance of harmful bacteria.  Some activities or behaviors can upset the normal balance of bacteria in the vagina and put women at increased risk including:  Having a new sex partner or multiple sex partners.  Douching.  Using an intrauterine device (IUD) for contraception.  It is not clear what role sexual activity plays in the development of BV. However, women that have never had sexual intercourse are rarely infected with BV. Women do not get BV from toilet seats, bedding, swimming pools or from touching objects around them.  SYMPTOMS   Grey vaginal discharge.  A fish-like odor with discharge, especially after sexual intercourse.  Itching or burning of the vagina and vulva.  Burning or pain with urination.  Some women have no signs or symptoms at  all. DIAGNOSIS  Your caregiver must examine the vagina for signs of BV. Your caregiver will perform lab tests and look at the sample of vaginal fluid through a microscope. They will look for bacteria and abnormal cells (clue cells), a pH test higher than 4.5, and a positive amine test all associated with BV.  RISKS AND COMPLICATIONS   Pelvic inflammatory disease (PID).  Infections following gynecology surgery.  Developing HIV.  Developing herpes virus. TREATMENT  Sometimes BV will clear up without treatment. However, all women with symptoms of BV should be treated to avoid complications, especially if gynecology surgery is planned. Female partners generally do not need to  be treated. However, BV may spread between female sex partners so treatment is helpful in preventing a recurrence of BV.   BV may be treated with antibiotics. The antibiotics come in either pill or vaginal cream forms. Either can be used with nonpregnant or pregnant women, but the recommended dosages differ. These antibiotics are not harmful to the baby.  BV can recur after treatment. If this happens, a second round of antibiotics will often be prescribed.  Treatment is important for pregnant women. If not treated, BV can cause a premature delivery, especially for a pregnant woman who had a premature birth in the past. All pregnant women who have symptoms of BV should be checked and treated.  For chronic reoccurrence of BV, treatment with a type of prescribed gel vaginally twice a week is helpful. HOME CARE INSTRUCTIONS   Finish all medication as directed by your caregiver.  Do not have sex until treatment is completed.  Tell your sexual partner that you have a vaginal infection. They should see their caregiver and be treated if they have problems, such as a mild rash or itching.  Practice safe sex. Use condoms. Only have 1 sex partner. PREVENTION  Basic prevention steps can help reduce the risk of upsetting the  natural balance of bacteria in the vagina and developing BV:  Do not have sexual intercourse (be abstinent).  Do not douche.  Use all of the medicine prescribed for treatment of BV, even if the signs and symptoms go away.  Tell your sex partner if you have BV. That way, they can be treated, if needed, to prevent reoccurrence. SEEK MEDICAL CARE IF:   Your symptoms are not improving after 3 days of treatment.  You have increased discharge, pain, or fever. MAKE SURE YOU:   Understand these instructions.  Will watch your condition.  Will get help right away if you are not doing well or get worse. FOR MORE INFORMATION  Division of STD Prevention (DSTDP), Centers for Disease Control and Prevention: [employer] American [employer] Health Association (ASHA): www.ashastd.org  Document Released: 01/06/2005 Document Revised: 03/31/2011 Document Reviewed: 06/29/2008 [facility] Patient Information 2013 [place], [place].

## 2012-05-25 LAB — WOUND CULTURE

## 2012-07-04 ENCOUNTER — Ambulatory Visit (INDEPENDENT_AMBULATORY_CARE_PROVIDER_SITE_OTHER): Payer: BC Managed Care – PPO | Admitting: Internal Medicine

## 2012-07-04 ENCOUNTER — Ambulatory Visit: Payer: BC Managed Care – PPO

## 2012-07-04 VITALS — BP 157/111 | HR 97 | Temp 98.0°F | Resp 16 | Ht 64.5 in | Wt 176.2 lb

## 2012-07-04 DIAGNOSIS — M542 Cervicalgia: Secondary | ICD-10-CM

## 2012-07-04 DIAGNOSIS — M5412 Radiculopathy, cervical region: Secondary | ICD-10-CM

## 2012-07-04 MED ORDER — PREDNISONE 10 MG PO TABS: ORAL_TABLET | ORAL | Status: DC

## 2012-07-04 MED ORDER — OXYCODONE-ACETAMINOPHEN 10-325 MG PO TABS: 1.0000 | ORAL_TABLET | Freq: Four times a day (QID) | ORAL | Status: DC | PRN

## 2012-07-04 MED ORDER — CYCLOBENZAPRINE HCL 10 MG PO TABS: 10.0000 mg | ORAL_TABLET | Freq: Three times a day (TID) | ORAL | Status: DC | PRN

## 2012-07-04 MED ORDER — KETOROLAC TROMETHAMINE 60 MG/2ML IM SOLN
60.0000 mg | Freq: Once | INTRAMUSCULAR | Status: AC
  Administered 2012-07-04: 60 mg via INTRAMUSCULAR

## 2012-07-04 NOTE — Progress Notes (Signed)
  Subjective:    Patient ID: [name], female    DOB: 05/24/1960, 52 y.o.   MRN: [id number]  HPI complaining of pain is very sharp and disabling in the right upper and middle back. Made worse by lifting the arm and shoulder. Right hand feels weak. Pain radiates down the right arm. 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 No fever. No headache.  Patient Active Problem List   Diagnosis Date Noted  . Thyroid cancer 05/13/2011    Priority: Medium  . Hypothyroidism, postsurgical 05/13/2011    Priority: Medium  . Hypertension 12/25/2010    Priority: Medium  . Menopause 06/17/2011  . Allergic rhinitis, cause unspecified 05/13/2011  . Reactive airway disease 05/13/2011  . Depressed 05/13/2011  . Migraine 05/13/2011      Review of Systems Noncontributory     Objective:   Physical Exam BP 157/111  Pulse 97  Temp(Src) 98 F (36.7 C) (Oral)  Resp 16  Ht 5' 4.5" (1.638 m)  Wt 176 lb 3.2 oz (79.924 kg)  BMI 29.79 kg/m2  LMP 06/17/2002 In significant pain Unwilling to move neck due to pain Very tender in the left posterior cervical region, left trapezius, left parascapular border. Grip on the right 3/5 Sensation intact in the right hand and arm Shoulder elevation to 45 creates pain in the left trapezius area any neck flexion or extension makes pain worse  Toradol 60 a.m./no pain relief after 20 minutes  [facility] reading (PRIMARY) by  Dr. Merla Riches loss of normal lordosis/disc spaces adequate      Assessment & Plan:  Problem #1 cervical radiculitis/radiculopathy-R  Meds ordered this encounter  Medications  . ketorolac (TORADOL) injection 60 mg    Sig:   . oxyCODONE-acetaminophen (PERCOCET) 10-325 MG per tablet    Sig: Take 1 tablet by mouth every 6 (six) hours as needed for pain.    Dispense:  25 tablet    Refill:  0  . predniSONE (DELTASONE)  10 MG tablet    Sig: 3/3/2/2/1/1 single daily dose for 6 days    Dispense:  12 tablet    Refill:  0  . cyclobenzaprine (FLEXERIL) 10 MG tablet    Sig: Take 1 tablet (10 mg total) by mouth 3 (three) times daily as needed for muscle spasms.    Dispense:  30 tablet    Refill:  0   F/u 3-4 d sooner if worse

## 2012-07-09 ENCOUNTER — Ambulatory Visit (INDEPENDENT_AMBULATORY_CARE_PROVIDER_SITE_OTHER): Payer: BC Managed Care – PPO | Admitting: Internal Medicine

## 2012-07-09 VITALS — BP 180/126 | HR 86 | Temp 98.0°F | Resp 16 | Ht 64.0 in | Wt 176.0 lb

## 2012-07-09 DIAGNOSIS — M5412 Radiculopathy, cervical region: Secondary | ICD-10-CM

## 2012-07-09 MED ORDER — GABAPENTIN 300 MG PO CAPS: 300.0000 mg | ORAL_CAPSULE | Freq: Three times a day (TID) | ORAL | Status: DC

## 2012-07-09 MED ORDER — OXYCODONE-ACETAMINOPHEN 10-325 MG PO TABS: 1.0000 | ORAL_TABLET | Freq: Four times a day (QID) | ORAL | Status: DC | PRN

## 2012-07-09 NOTE — Progress Notes (Signed)
  Subjective:    Patient ID: [name], female    DOB: 1960-07-03, 52 y.o.   MRN: [id number]  HPI Followup for acute cervical injury Is improved 50% but still has significant radicular pain to the right scapular border and to the right arm and significant pain with movement of the neck Coller affords some relief Meds helpful Some prednisone hyperness Occasionally needs to take 2 Percocet for relief   Review of Systems     Objective:   Physical Exam BP 180/126  Pulse 86  Temp(Src) 98 F (36.7 C) (Oral)  Resp 16  Ht 5\' 4"  (1.626 m)  Wt 176 lb (79.833 kg)  BMI 30.2 kg/m2  SpO2 98%  LMP 06/17/2002 In obvious pain Neck range of motion limited by pain Pain with shoulder elevation against resistance Tender palpation right paracervical, right trapezius, right scapular border, right anterior shoulder Grip full no sensory losses       Assessment & Plan:  Acute cervical radiculitis Blood pressure exaggerated by pain  Ref to [facility] Cont meds Add neurontin F/u 10-14d May refill meds as needed  Followup  Meds ordered this encounter  Medications  . oxyCODONE-acetaminophen (PERCOCET) 10-325 MG per tablet    Sig: Take 1 tablet by mouth every 6 (six) hours as needed for pain.    Dispense:  30 tablet    Refill:  0  . gabapentin (NEURONTIN) 300 MG capsule    Sig: Take 1 capsule (300 mg total) by mouth 3 (three) times daily.    Dispense:  30 capsule    Refill:  0

## 2012-07-11 ENCOUNTER — Telehealth: Payer: Self-pay

## 2012-07-11 NOTE — Telephone Encounter (Signed)
[facility] - PT WOULD LIKE TO KNOW IF YOU CAN CALL IN AN ANTIBIOTIC FOR HER SINUS INFECTION OR DOES SHE NEED TO COME IN AGAIN? [phone]

## 2012-07-12 MED ORDER — AMOXICILLIN-POT CLAVULANATE 875-125 MG PO TABS: 1.0000 | ORAL_TABLET | Freq: Two times a day (BID) | ORAL | Status: DC

## 2012-07-12 NOTE — Telephone Encounter (Signed)
PT CALLED AGAIN AND HADN'T HEARD FROM THE DR OR ANYONE. STILL WOULD LIKE TO HAVE SOMETHING FOR HER SINUS PLEASE CALL [phone]    [facility] [address] [facility] 

## 2012-07-12 NOTE — Telephone Encounter (Signed)
Do not see where we have seen for sinuses recently. She should come in, she wants Dr Merla Riches to call her she did not want to speak to me at all.

## 2012-07-12 NOTE — Telephone Encounter (Signed)
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 amoxicillin-clavulanate (AUGMENTIN) 875-125 MG per tablet    Sig: Take 1 tablet by mouth 2 (two) times daily.    Dispense:  20 tablet    Refill:  0

## 2012-07-13 NOTE — Telephone Encounter (Signed)
Patient advised.

## 2012-07-21 ENCOUNTER — Ambulatory Visit: Payer: BC Managed Care – PPO | Admitting: Internal Medicine

## 2012-07-22 ENCOUNTER — Other Ambulatory Visit: Payer: Self-pay | Admitting: Internal Medicine

## 2012-07-22 NOTE — Telephone Encounter (Signed)
Pharmacy told patient to call Dr. Merla Riches for refills on oxyCODONE-acetaminophen (PERCOCET) 10-325 MG per tablet cyclobenzaprine (FLEXERIL) 10 MG tablet  Patient stated Dr. Merla Riches told her to call when she ran out and he would refill these for her.    [phone]

## 2012-07-28 ENCOUNTER — Telehealth: Payer: Self-pay

## 2012-07-28 NOTE — Telephone Encounter (Signed)
PATIENT IS REQUESTING A REFILL ON OXYCODONE. SHE NEEDS TO PICK THE PRESCRIPTION UP. SHE SAID SHE CALLED 3 DAYS AGO AND NEVER RECEIVED A CALL BACK. BEST PHONE [phone] (CELL)   MBC

## 2012-07-28 NOTE — Telephone Encounter (Signed)
This is first request I have seen, but I was off yesterday. Please advise. pended

## 2012-07-29 MED ORDER — OXYCODONE-ACETAMINOPHEN 10-325 MG PO TABS: 1.0000 | ORAL_TABLET | Freq: Four times a day (QID) | ORAL | Status: DC | PRN

## 2012-07-29 NOTE — Telephone Encounter (Signed)
Patient advised Rx ready for pick up.

## 2012-07-29 NOTE — Telephone Encounter (Signed)
Rx printed

## 2012-07-29 NOTE — Telephone Encounter (Signed)
Ok.talk to [employer]

## 2012-08-12 ENCOUNTER — Other Ambulatory Visit: Payer: Self-pay | Admitting: Internal Medicine

## 2012-08-13 ENCOUNTER — Other Ambulatory Visit: Payer: Self-pay | Admitting: Internal Medicine

## 2012-08-16 ENCOUNTER — Other Ambulatory Visit: Payer: Self-pay | Admitting: Internal Medicine

## 2012-08-16 ENCOUNTER — Ambulatory Visit (INDEPENDENT_AMBULATORY_CARE_PROVIDER_SITE_OTHER): Payer: BC Managed Care – PPO | Admitting: Internal Medicine

## 2012-08-16 VITALS — BP 162/98 | HR 82 | Temp 98.3°F | Resp 18 | Ht 64.0 in | Wt 180.0 lb

## 2012-08-16 DIAGNOSIS — R635 Abnormal weight gain: Secondary | ICD-10-CM

## 2012-08-16 DIAGNOSIS — R300 Dysuria: Secondary | ICD-10-CM

## 2012-08-16 DIAGNOSIS — C73 Malignant neoplasm of thyroid gland: Secondary | ICD-10-CM

## 2012-08-16 DIAGNOSIS — I10 Essential (primary) hypertension: Secondary | ICD-10-CM

## 2012-08-16 DIAGNOSIS — E892 Postprocedural hypoparathyroidism: Secondary | ICD-10-CM

## 2012-08-16 DIAGNOSIS — N898 Other specified noninflammatory disorders of vagina: Secondary | ICD-10-CM

## 2012-08-16 DIAGNOSIS — E890 Postprocedural hypothyroidism: Secondary | ICD-10-CM

## 2012-08-16 LAB — POCT UA - MICROSCOPIC ONLY
Bacteria, U Microscopic: NEGATIVE
Casts, Ur, LPF, POC: NEGATIVE

## 2012-08-16 LAB — POCT CBC
Granulocyte percent: 57.8 %G (ref 37–80)
MCH, POC: 29 pg (ref 27–31.2)
MID (cbc): 0.4 (ref 0–0.9)
MPV: 9.7 fL (ref 0–99.8)
POC LYMPH PERCENT: 35.6 %L (ref 10–50)
POC MID %: 6.6 %M (ref 0–12)
Platelet Count, POC: 223 10*3/uL (ref 142–424)
RBC: 5.11 M/uL (ref 4.04–5.48)
RDW, POC: 13.5 %

## 2012-08-16 LAB — POCT URINALYSIS DIPSTICK
Glucose, UA: NEGATIVE
Spec Grav, UA: 1.025
Urobilinogen, UA: 0.2

## 2012-08-16 LAB — POCT WET PREP WITH KOH
KOH Prep POC: NEGATIVE
Trichomonas, UA: NEGATIVE

## 2012-08-16 MED ORDER — METRONIDAZOLE 0.75 % VA GEL: 1.0000 | Freq: Two times a day (BID) | VAGINAL | Status: DC

## 2012-08-16 MED ORDER — METRONIDAZOLE 500 MG PO TABS: 500.0000 mg | ORAL_TABLET | Freq: Two times a day (BID) | ORAL | Status: DC

## 2012-08-16 MED ORDER — CLOTRIMAZOLE-BETAMETHASONE 1-0.05 % EX CREA: TOPICAL_CREAM | Freq: Two times a day (BID) | CUTANEOUS | Status: DC

## 2012-08-16 NOTE — Progress Notes (Signed)
Subjective:    Patient ID: [name], female    DOB: 08-Nov-1960, 52 y.o.   MRN: [id number]  HPI   Wt gain verap Review of Systems     Objective:   Physical Exam        Results for orders placed in visit on 08/16/12  POCT WET PREP WITH KOH      Result Value Range   Trichomonas, UA Negative     Clue Cells Wet Prep HPF POC 0-1     Epithelial Wet Prep HPF POC 6-12     Yeast Wet Prep HPF POC neg     Bacteria Wet Prep HPF POC 2+     RBC Wet Prep HPF POC 2-5     WBC Wet Prep HPF POC 4-8     KOH Prep POC Negative    POCT CBC      Result Value Range   WBC 6.3  4.6 - 10.2 K/uL   Lymph, poc 2.2  0.6 - 3.4   POC LYMPH PERCENT 35.6  10 - 50 %L   MID (cbc) 0.4  0 - 0.9   POC MID % 6.6  0 - 12 %M   POC Granulocyte 3.6  2 - 6.9   Granulocyte percent 57.8  37 - 80 %G   RBC 5.11  4.04 - 5.48 M/uL   Hemoglobin 14.8  12.2 - 16.2 g/dL   HCT, POC 78.2  95.6 - 47.9 %   MCV 89.9  80 - 97 fL   MCH, POC 29.0  27 - 31.2 pg   MCHC 32.2  31.8 - 35.4 g/dL   RDW, POC 21.3     Platelet Count, POC 223  142 - 424 K/uL   MPV 9.7  0 - 99.8 fL  POCT UA - MICROSCOPIC ONLY      Result Value Range   WBC, Ur, HPF, POC 2-5     RBC, urine, microscopic 0-2     Bacteria, U Microscopic neg     Mucus, UA small     Epithelial cells, urine per micros 0-2     Crystals, Ur, HPF, POC neg     Casts, Ur, LPF, POC neg     Yeast, UA neg    POCT URINALYSIS DIPSTICK      Result Value Range   Color, UA yellow     Clarity, UA clear     Glucose, UA neg     Bilirubin, UA neg     Ketones, UA trace     Spec Grav, UA 1.025     Blood, UA neg     pH, UA 6.0     Protein, UA neg     Urobilinogen, UA 0.2     Nitrite, UA neg     Leukocytes, UA small (1+)      Assessment & Plan:  Burning with urination-due to introitus/perineal irritation with fissures  Vaginal discharge/irritation -hx BV recurrent-identical symptoms Weight gain, abnormal --she feels this is secondary to Effexor/would like to be on  pristiq but can't  afford it Essential hypertension, uncontrolled - Plan: Comprehensive metabolic panel, Lipid panel/continue Toprol, Dyazide, and verapamil 360-call if needs refills  Thyroid cancer 2009-believe this was discovered as an incidental finding on pathology 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 Celso Sickle at [facility] so we will do her lab work - Plan: Thyroglobulin,  Thyroglobulin antibody  Post-surgical hypothyroidism  Postsurgical hypoparathyroidism

## 2012-08-17 ENCOUNTER — Telehealth: Payer: Self-pay

## 2012-08-17 DIAGNOSIS — F329 Major depressive disorder, single episode, unspecified: Secondary | ICD-10-CM

## 2012-08-17 LAB — COMPREHENSIVE METABOLIC PANEL
ALT: 18 U/L (ref 0–35)
AST: 18 U/L (ref 0–37)
Alkaline Phosphatase: 66 U/L (ref 39–117)
Sodium: 143 mEq/L (ref 135–145)
Total Bilirubin: 0.4 mg/dL (ref 0.3–1.2)
Total Protein: 6.6 g/dL (ref 6.0–8.3)

## 2012-08-17 LAB — LIPID PANEL
Cholesterol: 165 mg/dL (ref 0–200)
Triglycerides: 192 mg/dL — ABNORMAL HIGH (ref <150)
VLDL: 38 mg/dL (ref 0–40)

## 2012-08-17 MED ORDER — DESVENLAFAXINE SUCCINATE ER 100 MG PO TB24: 100.0000 mg | ORAL_TABLET | Freq: Every day | ORAL | Status: DC

## 2012-08-17 NOTE — Telephone Encounter (Signed)
Patient saw Dr. Merla Riches last night and they discussed changing prescription as long as insurance covers it. Patient says insurance covers pristiq so she would like to start that.

## 2012-08-17 NOTE — Telephone Encounter (Signed)
Chg to pristiq due to wt gain May change directly and d/c effexor

## 2012-08-17 NOTE — Telephone Encounter (Signed)
Pended, she was previously on 100 mg do you want to restart at this dose? Or start lower?

## 2012-08-19 ENCOUNTER — Telehealth: Payer: Self-pay

## 2012-08-19 ENCOUNTER — Encounter: Payer: Self-pay | Admitting: Internal Medicine

## 2012-08-19 NOTE — Telephone Encounter (Signed)
Per Dr. Merla Riches, please add a T3 and T4. Spoke with Tiffany at [facility]. Tests added.

## 2012-08-20 ENCOUNTER — Telehealth: Payer: Self-pay

## 2012-08-20 NOTE — Telephone Encounter (Signed)
Please advise on labs.

## 2012-08-20 NOTE — Telephone Encounter (Signed)
Pt would like to know that status on her labs. Best# [phone]

## 2012-08-20 NOTE — Telephone Encounter (Signed)
Thanks, did not see the letter. Patient advised.

## 2012-08-20 NOTE — Telephone Encounter (Signed)
Mailed letter yesterday---cancer markers negative

## 2012-08-21 MED ORDER — LEVOTHYROXINE SODIUM 175 MCG PO TABS: 175.0000 ug | ORAL_TABLET | Freq: Every day | ORAL | Status: DC

## 2012-08-21 NOTE — Addendum Note (Signed)
Addended by: Tonye Pearson on: 08/21/2012 01:05 PM   Modules accepted: Orders, Medications

## 2012-08-21 NOTE — Progress Notes (Signed)
Results for orders placed in visit on 08/16/12  COMPREHENSIVE METABOLIC PANEL      Result Value Range   Sodium 143  135 - 145 mEq/L   Potassium 3.6  3.5 - 5.3 mEq/L   Chloride 104  96 - 112 mEq/L   CO2 32  19 - 32 mEq/L   Glucose, Bld 86  70 - 99 mg/dL   BUN 13  6 - 23 mg/dL   Creat 1.61  0.96 - 0.45 mg/dL   Total Bilirubin 0.4  0.3 - 1.2 mg/dL   Alkaline Phosphatase 66  39 - 117 U/L   AST 18  0 - 37 U/L   ALT 18  0 - 35 U/L   Total Protein 6.6  6.0 - 8.3 g/dL   Albumin 4.2  3.5 - 5.2 g/dL   Calcium 8.1 (*) 8.4 - 10.5 mg/dL  TSH      Result Value Range   TSH <0.008 (*) 0.350 - 4.500 uIU/mL  LIPID PANEL      Result Value Range   Cholesterol 165  0 - 200 mg/dL   Triglycerides 409 (*) <150 mg/dL   HDL 37 (*) >81 mg/dL   Total CHOL/HDL Ratio 4.5     VLDL 38  0 - 40 mg/dL   LDL Cholesterol 90  0 - 99 mg/dL  THYROGLOBULIN      Result Value Range   Thyroglobulin <0.2  0.0 - 55.0 ng/mL  THYROGLOBULIN ANTIBODY      Result Value Range   Thyroglobulin Ab <20.0  <40.0 U/mL  POCT WET PREP WITH KOH      Result Value Range   Trichomonas, UA Negative     Clue Cells Wet Prep HPF POC 0-1     Epithelial Wet Prep HPF POC 6-12     Yeast Wet Prep HPF POC neg     Bacteria Wet Prep HPF POC 2+     RBC Wet Prep HPF POC 2-5     WBC Wet Prep HPF POC 4-8     KOH Prep POC Negative    POCT CBC      Result Value Range   WBC 6.3  4.6 - 10.2 K/uL   Lymph, poc 2.2  0.6 - 3.4   POC LYMPH PERCENT 35.6  10 - 50 %L   MID (cbc) 0.4  0 - 0.9   POC MID % 6.6  0 - 12 %M   POC Granulocyte 3.6  2 - 6.9   Granulocyte percent 57.8  37 - 80 %G   RBC 5.11  4.04 - 5.48 M/uL   Hemoglobin 14.8  12.2 - 16.2 g/dL   HCT, POC 19.1  47.8 - 47.9 %   MCV 89.9  80 - 97 fL   MCH, POC 29.0  27 - 31.2 pg   MCHC 32.2  31.8 - 35.4 g/dL   RDW, POC 29.5     Platelet Count, POC 223  142 - 424 K/uL   MPV 9.7  0 - 99.8 fL  POCT UA - MICROSCOPIC ONLY      Result Value Range   WBC, Ur, HPF, POC 2-5     RBC, urine,  microscopic 0-2     Bacteria, U Microscopic neg     Mucus, UA small     Epithelial cells, urine per micros 0-2     Crystals, Ur, HPF, POC neg     Casts, Ur, LPF, POC neg     Yeast, UA neg  POCT URINALYSIS DIPSTICK      Result Value Range   Color, UA yellow     Clarity, UA clear     Glucose, UA neg     Bilirubin, UA neg     Ketones, UA trace     Spec Grav, UA 1.025     Blood, UA neg     pH, UA 6.0     Protein, UA neg     Urobilinogen, UA 0.2     Nitrite, UA neg     Leukocytes, UA small (1+)     T4 15  Needs decr synthr to 175--ck 3 mos

## 2012-08-27 ENCOUNTER — Telehealth: Payer: Self-pay

## 2012-08-27 NOTE — Telephone Encounter (Signed)
Pt requesting refill for her oxy.   Best phone [phone]

## 2012-09-01 MED ORDER — OXYCODONE-ACETAMINOPHEN 10-325 MG PO TABS: 1.0000 | ORAL_TABLET | Freq: Four times a day (QID) | ORAL | Status: DC | PRN

## 2012-09-01 NOTE — Telephone Encounter (Signed)
Meds ordered this encounter  Medications  . oxyCODONE-acetaminophen (PERCOCET) 10-325 MG per tablet    Sig: Take 1 tablet by mouth every 6 (six) hours as needed for pain.    Dispense:  30 tablet    Refill:  0    

## 2012-10-09 ENCOUNTER — Other Ambulatory Visit: Payer: Self-pay | Admitting: Internal Medicine

## 2012-10-09 NOTE — Telephone Encounter (Signed)
Dr Merla Riches, pt's calcium was slightly low at 08/16/12 OV. I just wanted to check to make sure you wanted pt on same dose of calcitriol before RFing.

## 2012-10-10 ENCOUNTER — Ambulatory Visit (INDEPENDENT_AMBULATORY_CARE_PROVIDER_SITE_OTHER): Payer: BC Managed Care – PPO | Admitting: Physician Assistant

## 2012-10-10 ENCOUNTER — Ambulatory Visit: Payer: BC Managed Care – PPO

## 2012-10-10 VITALS — BP 150/100 | HR 89 | Temp 98.0°F | Resp 20 | Ht 64.0 in | Wt 179.0 lb

## 2012-10-10 DIAGNOSIS — R059 Cough, unspecified: Secondary | ICD-10-CM

## 2012-10-10 DIAGNOSIS — R05 Cough: Secondary | ICD-10-CM

## 2012-10-10 DIAGNOSIS — R911 Solitary pulmonary nodule: Secondary | ICD-10-CM

## 2012-10-10 DIAGNOSIS — R062 Wheezing: Secondary | ICD-10-CM

## 2012-10-10 DIAGNOSIS — J40 Bronchitis, not specified as acute or chronic: Secondary | ICD-10-CM

## 2012-10-10 MED ORDER — PREDNISONE 20 MG PO TABS: ORAL_TABLET | ORAL | Status: DC

## 2012-10-10 MED ORDER — HYDROCODONE-HOMATROPINE 5-1.5 MG/5ML PO SYRP
ORAL_SOLUTION | ORAL | Status: DC
Start: 2012-10-10 — End: 2012-10-12

## 2012-10-10 MED ORDER — BECLOMETHASONE DIPROPIONATE 40 MCG/ACT IN AERS: 1.0000 | INHALATION_SPRAY | Freq: Two times a day (BID) | RESPIRATORY_TRACT | Status: DC

## 2012-10-10 MED ORDER — AZITHROMYCIN 250 MG PO TABS: ORAL_TABLET | ORAL | Status: DC

## 2012-10-10 MED ORDER — ALBUTEROL SULFATE HFA 108 (90 BASE) MCG/ACT IN AERS: 2.0000 | INHALATION_SPRAY | RESPIRATORY_TRACT | Status: DC | PRN

## 2012-10-10 NOTE — Progress Notes (Unsigned)
See CXR overread.

## 2012-10-10 NOTE — Progress Notes (Signed)
Patient ID: [name] MRN: [id number], DOB: 1960/03/06, 52 y.o. Date of Encounter: 10/10/2012, 8:42 AM  Primary Physician: Tonye Pearson, MD  Chief Complaint:  Chief Complaint  Patient presents with  . Headache  . URI    5-6 days  . Sore Throat  . Cough    dry cough  . Medication Refill    see med list in EPIC    HPI: 52 y.o. female presents with a 4-5 day history of nasal congestion, post nasal drip, sore throat, headache, and cough. Mild sinus pressure. Subjective fever and chills. Nasal congestion thick and green/yellow. Cough is mildly productive of green/yellow sputum and not associated with time of day. Some wheezing. Ears feel full, leading to sensation of muffled hearing. Has tried OTC cold preps without success. No GI complaints. Appetite slightly decreased. She has not taken her regular medications the last 2 days. 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   No recent antibiotics, or recent travels.   No leg trauma, sedentary periods, or current tobacco use.  Past Medical History  Diagnosis Date  . HTN (hypertension)   . Herpes   . Depression   . Asthma   . Deviated septum   . Insomnia   . Hypothyroid   . Thyroid cancer 2009  . Allergy   . Neuromuscular disorder      Home Meds: Prior to Admission medications   Medication Sig Start Date End Date Taking? Authorizing Provider  acyclovir (ZOVIRAX) 200 MG capsule Take 2 capsules (400 mg total) by mouth 2 (two) times daily. 02/04/12  Yes Tonye Pearson, MD  calcitRIOL (ROCALTROL) 0.25 MCG capsule Take 2 capsules (0.5 mcg total) by mouth daily. 09/22/11  Yes Tonye Pearson, MD  clonazePAM (KLONOPIN) 1 MG tablet TAKE 1 TABLET BY MOUTH TWICE A DAY AS NEEDED 08/12/12  Yes Tonye Pearson, MD  clotrimazole-betamethasone (LOTRISONE) cream Apply topically 2 (two) times daily. 08/16/12  Yes Tonye Pearson, MD  cyclobenzaprine (FLEXERIL) 10 MG tablet TAKE 1 TABLET (10 MG TOTAL) BY  MOUTH 3 (THREE) TIMES DAILY AS NEEDED FOR MUSCLE SPASMS. 07/22/12  Yes Tonye Pearson, MD  desvenlafaxine (PRISTIQ) 100 MG 24 hr tablet Take 1 tablet (100 mg total) by mouth daily. May dispense as 30 d supplies if economics dictate. 08/17/12  Yes Tonye Pearson, MD  estradiol (ESTRACE VAGINAL) 0.1 MG/GM vaginal cream Use vaginally qhs for 1 wk, then decrease to 3x per week for 2 weeks then taper down to once weekly as symptoms improve. 12/13/11  Yes Morrell Riddle, PA-C  levothyroxine (SYNTHROID, LEVOTHROID) 175 MCG tablet Take 1 tablet (175 mcg total) by mouth daily. 08/21/12  Yes Tonye Pearson, MD  metroNIDAZOLE (METROGEL) 0.75 % vaginal gel Place 1 Applicatorful vaginally 2 (two) times daily. Leave on file and may request monthly if needed for the next 5-6 months 08/16/12  Yes Tonye Pearson, MD  oxyCODONE-acetaminophen (PERCOCET) 10-325 MG per tablet Take 1 tablet by mouth every 6 (six) hours as needed for pain. 09/01/12  Yes Tonye Pearson, MD  rizatriptan (MAXALT) 10 MG tablet TAKE 1 TABLET (10 MG TOTAL) BY MOUTH AS NEEDED FOR MIGRAINE. MAY REPEAT IN 2 HOURS IF NEEDED 08/13/12  Yes Tonye Pearson, MD  triamterene-hydrochlorothiazide (DYAZIDE) 37.5-25 MG per capsule Take 1 each (1 capsule total) by mouth every morning. 09/22/11  Yes Tonye Pearson, MD  metoprolol succinate (TOPROL-XL) 50 MG 24 hr tablet Take 1 tablet (50  mg total) by mouth daily. 09/22/11 09/21/12  Tonye Pearson, MD  metroNIDAZOLE (FLAGYL) 500 MG tablet Take 1 tablet (500 mg total) by mouth 2 (two) times daily with a meal. DO NOT CONSUME ALCOHOL WHILE TAKING THIS MEDICATION. 08/16/12   Tonye Pearson, MD  montelukast (SINGULAIR) 10 MG tablet Take 1 tablet (10 mg total) by mouth at bedtime. 09/22/11 09/21/12  Tonye Pearson, MD    Allergies:  Allergies  Allergen Reactions  . Codeine   . Lisinopril     History   Social History  . Marital Status: Married    Spouse Name: N/A    Number of Children: 1    . Years of Education: N/A   Occupational History  .      Teach elementary school   Social History Main Topics  . Smoking status: Former Smoker -- 2.00 packs/day    Quit date: 05/13/1990  . Smokeless tobacco: Never Used  . Alcohol Use: No     Comment: Rarely  . Drug Use: No  . Sexual Activity: Yes   Other Topics Concern  . Not on file   Social History Narrative    She is a Chartered loss adjuster.  Has an 76-year-old child.  Does    not smoke cigarettes or drink alcohol.  She is married.           Review of Systems: Constitutional: positive for fever, chills, and fatigue HEENT: see above Cardiovascular: negative for chest pain or palpitations Respiratory: positive for cough and wheezing negative for shortness of breath Abdominal: negative for abdominal pain, nausea, vomiting or diarrhea Dermatological: negative for rash Neurologic: positive for headache   Physical Exam: Blood pressure 150/100, pulse 89, temperature 98 F (36.7 C), temperature source Oral, resp. rate 20, height 5\' 4"  (1.626 m), weight 179 lb (81.194 kg), last menstrual period 06/17/2002, SpO2 99.00%., Body mass index is 30.71 kg/(m^2). General: Well developed, well nourished, in no acute distress. Head: Normocephalic, atraumatic, eyes without discharge, sclera non-icteric, nares are congested. Bilateral auditory canals clear, TM's are without perforation, pearly grey with reflective cone of light bilaterally. No sinus TTP. Oral cavity moist, dentition normal. Posterior pharynx with post nasal drip and mild erythema. No peritonsillar abscess or tonsillar exudate. Neck: Supple. No thyromegaly. Full ROM. No lymphadenopathy. Lungs: Coarse breath sounds right side with wheezes. No rales or rhonchi. Breathing is unlabored.  Heart: RRR with S1 S2. No murmurs, rubs, or gallops appreciated. Msk:  Strength and tone normal for age. Extremities: No clubbing or cyanosis. No edema. Neuro: Alert and oriented X 3. Moves all  extremities spontaneously. CNII-XII grossly in tact. Psych:  Responds to questions appropriately with a normal affect.   [facility] reading (PRIMARY) by  Dr. Perrin Maltese. Increased perihilar markings without discrete infiltrate.     ASSESSMENT AND PLAN:  52 y.o. female with bronchitis, wheezing, and cough.  -Azithromycin 250 MG #6 2 po first day then 1 po next 4 days no RF -Prednisone 20 mg #18 3x3, 2x3, 1x3 no RF -Proventil 2 puffs inhaled q 4-6 hours prn #1 no RF -Qvar 40 mcg 1 puff inhaled bid #1 no RF -Hycodan #4oz 1 tsp po q 4-6 hours prn cough no RF SED, patient states she tolerates -Mucinex -Tylenol/Motrin prn -Rest/fluids -RTC precautions -RTC 3-5 days if no improvement -Take routine medications that she has not taken in 2 days  Signed, Eula Listen, PA-C 10/10/2012 8:42 AM

## 2012-10-12 ENCOUNTER — Other Ambulatory Visit: Payer: Self-pay | Admitting: Physician Assistant

## 2012-10-13 ENCOUNTER — Telehealth: Payer: Self-pay

## 2012-10-13 MED ORDER — BENZONATATE 100 MG PO CAPS: 100.0000 mg | ORAL_CAPSULE | Freq: Three times a day (TID) | ORAL | Status: DC | PRN

## 2012-10-13 NOTE — Telephone Encounter (Signed)
Pt wants a RF of her cough syrup please.

## 2012-10-13 NOTE — Telephone Encounter (Signed)
Patient advised to use a measuring device she is told to take as directed and to measure each dose. Have asked pharmacy to provide patient with a measuring device.

## 2012-10-13 NOTE — Telephone Encounter (Signed)
Patient called checking status of refill for cough syrup. She got filled #120 ml 9/21. She said she is having to use this about every 3.5 hours. She has been working so has not been able to get rest like suggested and when she is at work she doesn't measure so she is not sure if its exactly 5ml each time. She did say has one dose left. I did ask if she thought the medication worked for her to see if needed to be changed to something else since she was having to use so often and already requesting refill.  She said this medication works great for her cough. Her cough is worse at night. Its still so bad it does keep her up at night, but when she takes medication it does help her cough so she can rest.  Please advise Alycia Rossetti is out of office.

## 2012-10-13 NOTE — Telephone Encounter (Signed)
I have refilled a small quantity of the cough syrup for pt, but she MUST take as directed.  She needs to MEASURE each dose.  This medication can be very dangerous in overdose.  I have also sent pt Tessalon Perles to use for cough as well.  Use Tessalon FIRST then cough syrup if needed

## 2012-11-09 ENCOUNTER — Other Ambulatory Visit: Payer: Self-pay | Admitting: Internal Medicine

## 2012-11-10 NOTE — Telephone Encounter (Signed)
Ryan, can we RF pt's Rx? She came in recently for med refill along w/acute issue and it looks like had to treat the acute problem.

## 2012-11-15 ENCOUNTER — Other Ambulatory Visit: Payer: Self-pay

## 2012-11-18 ENCOUNTER — Ambulatory Visit (INDEPENDENT_AMBULATORY_CARE_PROVIDER_SITE_OTHER): Payer: BC Managed Care – PPO | Admitting: Family Medicine

## 2012-11-18 VITALS — BP 168/90 | HR 91 | Temp 98.7°F | Resp 16 | Ht 64.5 in | Wt 179.0 lb

## 2012-11-18 DIAGNOSIS — H109 Unspecified conjunctivitis: Secondary | ICD-10-CM

## 2012-11-18 DIAGNOSIS — J019 Acute sinusitis, unspecified: Secondary | ICD-10-CM

## 2012-11-18 MED ORDER — CEFDINIR 300 MG PO CAPS: 600.0000 mg | ORAL_CAPSULE | Freq: Every day | ORAL | Status: DC

## 2012-11-18 MED ORDER — OFLOXACIN 0.3 % OP SOLN: OPHTHALMIC | Status: DC

## 2012-11-18 NOTE — Progress Notes (Signed)
Subjective: For several days patient has had a conjunctivitis on the right. 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 She has antibiotic eyedrops at home which Dr. Merla Riches is given her to use when she has these episodes. She started them a couple days ago, but advised he stay red and angry. She has now developed sinus pressure and head congestion.  Objective: TMs are normal. Eyes PERRLA with fundi benign. Her right eye is severely injected in the conjunctiva, with some chemosis. Throat is clear. Neck supple without significant nodes. However she is tender in the angle of the jaw. Chest is clear to auscultation. Heart regular. Vision is 20/25 bilaterally.  Assessment: Acute conjunctivitis Sinusitis  Plan: Omnicef twice daily Ofloxacin ophthalmic drops Return if in all worse or if not improving

## 2012-11-18 NOTE — Patient Instructions (Signed)
Use the eyedrops one or 2 drops in right eye every 2-4 hours for the first 24 hours, then 4 times daily. Use in the left eye several times daily.  Use your Flonase nasal spray  Take Omnicef (Ceftin ear) one twice daily for infection

## 2012-12-06 ENCOUNTER — Other Ambulatory Visit: Payer: Self-pay | Admitting: Internal Medicine

## 2012-12-08 ENCOUNTER — Telehealth: Payer: Self-pay | Admitting: Radiology

## 2012-12-08 ENCOUNTER — Ambulatory Visit: Payer: BC Managed Care – PPO | Admitting: Internal Medicine

## 2012-12-08 MED ORDER — OXYCODONE-ACETAMINOPHEN 10-325 MG PO TABS: 1.0000 | ORAL_TABLET | Freq: Four times a day (QID) | ORAL | Status: DC | PRN

## 2012-12-08 NOTE — Telephone Encounter (Signed)
Patient states she called several days ago for refill on pain meds, please advise.

## 2012-12-08 NOTE — Telephone Encounter (Signed)
See 07/09/12 Did she pursue PT as discussed? Meds ordered this encounter  Medications  . oxyCODONE-acetaminophen (PERCOCET) 10-325 MG per tablet    Sig: Take 1 tablet by mouth every 6 (six) hours as needed for pain.    Dispense:  30 tablet    Refill:  0  may get someone to sign if I don't get by tomorrow

## 2012-12-09 NOTE — Telephone Encounter (Signed)
She went twice she is to follow up with you on Jan 7th, indicates PT not helpful

## 2012-12-30 ENCOUNTER — Ambulatory Visit (INDEPENDENT_AMBULATORY_CARE_PROVIDER_SITE_OTHER): Payer: BC Managed Care – PPO | Admitting: Family Medicine

## 2012-12-30 VITALS — BP 166/98 | HR 87 | Temp 98.3°F | Resp 16 | Ht 65.8 in | Wt 180.0 lb

## 2012-12-30 DIAGNOSIS — R509 Fever, unspecified: Secondary | ICD-10-CM

## 2012-12-30 DIAGNOSIS — J0100 Acute maxillary sinusitis, unspecified: Secondary | ICD-10-CM

## 2012-12-30 DIAGNOSIS — R05 Cough: Secondary | ICD-10-CM

## 2012-12-30 MED ORDER — LEVOFLOXACIN 500 MG PO TABS: 500.0000 mg | ORAL_TABLET | Freq: Every day | ORAL | Status: DC

## 2012-12-30 NOTE — Progress Notes (Signed)
Subjective:    Patient ID: [name], female    DOB: 13-May-1960, 52 y.o.   MRN: [id number] This chart was scribed for Meredith Staggers, MD by Danella Maiers, ED Scribe. This patient was seen in room 2 and the patient's care was started at 5:13 PM.  Chief Complaint  Patient presents with  . Fever  . Sinusitis    drains down into chest   HPI HPI Comments: [name] is a 52 y.o. female who presents to the Emergency Department complaining of facial sinus pressure with associated chest congestion that drains down into her chest. She states the drainage into her chest is worse at night. She also reports cough that is productive only in the mornings and mild fever. Her max home temp recorded was 100.1 last night. 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 Most of it resolved but the facial pressure and congestion is still there. She has been having SOB and wheezing only at night and has been using her albuterol inhaler with relief. She has been taking Advil.   Patient Active Problem List   Diagnosis Date Noted  . 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 Menopause 06/17/2011  . Allergic rhinitis, cause unspecified 05/13/2011  . Reactive airway disease 05/13/2011  . Depressed 05/13/2011  . Migraine 05/13/2011  . Thyroid cancer 05/13/2011  . Hypothyroidism, postsurgical 05/13/2011  . Hypertension 12/25/2010   Past Medical History  Diagnosis Date  . HTN (hypertension)   . Herpes   . Depression   . Asthma   . Deviated septum   . Insomnia   . Hypothyroid   . Thyroid cancer 2009  . Allergy   . Neuromuscular disorder    Past Surgical History  Procedure Laterality Date  . Cesarean section    . Thyroidectomy      PARATHYROIDECTOMY  . Dilatation and curettage    . Cholecystectomy     Allergies  Allergen Reactions  . Codeine   . Lisinopril    Prior to Admission  medications   Medication Sig Start Date End Date Taking? Authorizing Provider  acyclovir (ZOVIRAX) 200 MG capsule Take 2 capsules (400 mg total) by mouth 2 (two) times daily. 02/04/12  Yes Tonye Pearson, MD  albuterol (PROVENTIL HFA;VENTOLIN HFA) 108 (90 BASE) MCG/ACT inhaler Inhale 2 puffs into the lungs every 4 (four) hours as needed for wheezing. 10/10/12  Yes Raymon Mutton Dunn, PA-C  beclomethasone (QVAR) 40 MCG/ACT inhaler Inhale 1 puff into the lungs 2 (two) times daily. 10/10/12  Yes Ryan M Dunn, PA-C  calcitRIOL (ROCALTROL) 0.25 MCG capsule TAKE 2 CAPSULES (0.5 MCG TOTAL) BY MOUTH DAILY. 10/09/12  Yes Tonye Pearson, MD  clonazePAM (KLONOPIN) 1 MG tablet TAKE 1 TABLET BY MOUTH TWICE A DAY AS NEEDED 08/12/12  Yes Tonye Pearson, MD  clotrimazole-betamethasone (LOTRISONE) cream Apply topically 2 (two) times daily. 08/16/12  Yes Tonye Pearson, MD  cyclobenzaprine (FLEXERIL) 10 MG tablet TAKE 1 TABLET (10 MG TOTAL) BY MOUTH 3 (THREE) TIMES DAILY AS NEEDED FOR MUSCLE SPASMS. 07/22/12  Yes Tonye Pearson, MD  desvenlafaxine (PRISTIQ) 100 MG 24 hr tablet Take 1 tablet (100 mg total) by mouth daily. May dispense as 30 d supplies if economics dictate. 08/17/12  Yes Tonye Pearson, MD  estradiol (ESTRACE VAGINAL) 0.1 MG/GM vaginal cream Use vaginally qhs for 1 wk, then decrease to 3x per week for 2 weeks then taper down  to once weekly as symptoms improve. 12/13/11  Yes Morrell Riddle, PA-C  levothyroxine (SYNTHROID, LEVOTHROID) 175 MCG tablet Take 1 tablet (175 mcg total) by mouth daily. 08/21/12  Yes Tonye Pearson, MD  metroNIDAZOLE (METROGEL) 0.75 % vaginal gel Place 1 Applicatorful vaginally 2 (two) times daily. Leave on file and may request monthly if needed for the next 5-6 months 08/16/12  Yes Tonye Pearson, MD  montelukast (SINGULAIR) 10 MG tablet TAKE 1 TABLET (10 MG TOTAL) BY MOUTH AT BEDTIME. 12/06/12  Yes Eleanore E Egan, PA-C  ofloxacin (OCUFLOX) 0.3 % ophthalmic solution 1-2  drops in right eye every 2-4 hours when awake for the first 24 hours, then 4 times daily 11/18/12  Yes Peyton Najjar, MD  oxyCODONE-acetaminophen (PERCOCET) 10-325 MG per tablet Take 1 tablet by mouth every 6 (six) hours as needed for pain. 12/08/12  Yes Tonye Pearson, MD  rizatriptan (MAXALT) 10 MG tablet TAKE 1 TABLET (10 MG TOTAL) BY MOUTH AS NEEDED FOR MIGRAINE. MAY REPEAT IN 2 HOURS IF NEEDED 08/13/12  Yes Tonye Pearson, MD  triamterene-hydrochlorothiazide (DYAZIDE) 37.5-25 MG per capsule TAKE 1 EACH (1 CAPSULE TOTAL) BY MOUTH EVERY MORNING. 11/09/12  Yes Ryan M Dunn, PA-C  metoprolol succinate (TOPROL-XL) 50 MG 24 hr tablet Take 1 tablet (50 mg total) by mouth daily. 09/22/11 09/21/12  Tonye Pearson, MD   History  Substance Use Topics  . Smoking status: Former Smoker -- 2.00 packs/day    Quit date: 05/13/1990  . Smokeless tobacco: Never Used  . Alcohol Use: No     Comment: Rarely    Review of Systems  Constitutional: Positive for fever.  HENT: Positive for congestion and sinus pressure.   Respiratory: Positive for shortness of breath (at night) and wheezing (at night).        Objective:   Physical Exam  Nursing note and vitals reviewed. Constitutional: She is oriented to person, place, and time. She appears well-developed and well-nourished. No distress.  HENT:  Head: Normocephalic and atraumatic.  Slight white/clear discharge with edematous terminates on the left  Eyes: EOM are normal.  Neck: Neck supple. No tracheal deviation present.  No lymphadenopathy. Left maxillary sinus tenderness. Frontal is negative.  Cardiovascular: Normal rate.   Pulmonary/Chest: Effort normal and breath sounds normal. No respiratory distress. She has no wheezes. She has no rhonchi.  Musculoskeletal: Normal range of motion.  Lymphadenopathy:    She has no cervical adenopathy.  Neurological: She is alert and oriented to person, place, and time.  Skin: Skin is warm and dry.    Psychiatric: She has a normal mood and affect. Her behavior is normal.     Filed Vitals:   12/30/12 1705  BP: 166/98  Pulse: 87  Temp: 98.3 F (36.8 C)  TempSrc: Oral  Resp: 16  Height: 5' 5.8" (1.671 m)  Weight: 180 lb (81.647 kg)  SpO2: 98%      Assessment & Plan:   [name] is a 52 y.o. female Maxillary sinusitis, acute - Plan: levofloxacin (LEVAQUIN) 500 MG tablet  Fever, unspecified - Plan: levofloxacin (LEVAQUIN) 500 MG tablet  Cough - Plan: levofloxacin (LEVAQUIN) 500 MG tablet  Suspected initial URI over thanksgiving, then persistent left nasal congestion and pressure. Probable left maxillary sinusitis. Reported fever at home, afebrile here and lungs are clear. H/o incomplete resolution of sinus symptoms in the past with Augmentin. Will treat with Levaquin 500mg  QD #10 SED return to clinic precautions discussed.  Hx. of RAD, no wheeze on exam at present. Has albuterol if needed, rtc precuations re: use of this or increased wheezing sx's discussed.  Meds ordered this encounter  Medications  . levofloxacin (LEVAQUIN) 500 MG tablet    Sig: Take 1 tablet (500 mg total) by mouth daily.    Dispense:  10 tablet    Refill:  0   Patient Instructions  Saline nasal spray atleast 4 times per day, over the counter mucinex or mucinex DM if needed for cough (stop these if more wheezing), drink plenty of fluids. Ok to take albuterol as discussed if you are wheezing, but if increased or persistent use of this medication over next few days.  Return to the clinic or go to the nearest emergency room if any of your symptoms worsen or new symptoms occur.  Sinusitis Sinusitis is redness, soreness, and swelling (inflammation) of the paranasal sinuses. Paranasal sinuses are air pockets within the bones of your face (beneath the eyes, the middle of the forehead, or above the eyes). In healthy paranasal sinuses, mucus is able to drain out, and air is able to circulate through them by  way of your nose. However, when your paranasal sinuses are inflamed, mucus and air can become trapped. This can allow bacteria and other germs to grow and cause infection. Sinusitis can develop quickly and last only a short time (acute) or continue over a long period (chronic). Sinusitis that lasts for more than 12 weeks is considered chronic.  CAUSES  Causes of sinusitis include:  Allergies.  Structural abnormalities, such as displacement of the cartilage that separates your nostrils (deviated septum), which can decrease the air flow through your nose and sinuses and affect sinus drainage.  Functional abnormalities, such as when the small hairs (cilia) that line your sinuses and help remove mucus do not work properly or are not present. SYMPTOMS  Symptoms of acute and chronic sinusitis are the same. The primary symptoms are pain and pressure around the affected sinuses. Other symptoms include:  Upper toothache.  Earache.  Headache.  Bad breath.  Decreased sense of smell and taste.  A cough, which worsens when you are lying flat.  Fatigue.  Fever.  Thick drainage from your nose, which often is green and may contain pus (purulent).  Swelling and warmth over the affected sinuses. DIAGNOSIS  Your caregiver will perform a physical exam. During the exam, your caregiver may:  Look in your nose for signs of abnormal growths in your nostrils (nasal polyps).  Tap over the affected sinus to check for signs of infection.  View the inside of your sinuses (endoscopy) with a special imaging device with a light attached (endoscope), which is inserted into your sinuses. If your caregiver suspects that you have chronic sinusitis, one or more of the following tests may be recommended:  Allergy tests.  Nasal culture A sample of mucus is taken from your nose and sent to a lab and screened for bacteria.  Nasal cytology A sample of mucus is taken from your nose and examined by your caregiver  to determine if your sinusitis is related to an allergy. TREATMENT  Most cases of acute sinusitis are related to a viral infection and will resolve on their own within 10 days. Sometimes medicines are prescribed to help relieve symptoms (pain medicine, decongestants, nasal steroid sprays, or saline sprays).  However, for sinusitis related to a bacterial infection, your caregiver will prescribe antibiotic medicines. These are medicines that will help kill the bacteria  causing the infection.  Rarely, sinusitis is caused by a fungal infection. In theses cases, your caregiver will prescribe antifungal medicine. For some cases of chronic sinusitis, surgery is needed. Generally, these are cases in which sinusitis recurs more than 3 times per year, despite other treatments. HOME CARE INSTRUCTIONS   Drink plenty of water. Water helps thin the mucus so your sinuses can drain more easily.  Use a humidifier.  Inhale steam 3 to 4 times a day (for example, sit in the bathroom with the shower running).  Apply a warm, moist washcloth to your face 3 to 4 times a day, or as directed by your caregiver.  Use saline nasal sprays to help moisten and clean your sinuses.  Take over-the-counter or prescription medicines for pain, discomfort, or fever only as directed by your caregiver. SEEK IMMEDIATE MEDICAL CARE IF:  You have increasing pain or severe headaches.  You have nausea, vomiting, or drowsiness.  You have swelling around your face.  You have vision problems.  You have a stiff neck.  You have difficulty breathing. MAKE SURE YOU:   Understand these instructions.  Will watch your condition.  Will get help right away if you are not doing well or get worse. Document Released: 01/06/2005 Document Revised: 03/31/2011 Document Reviewed: 01/21/2011 [facility] Patient Information 2014 [place], [place].

## 2012-12-30 NOTE — Patient Instructions (Signed)
Saline nasal spray atleast 4 times per day, over the counter mucinex or mucinex DM if needed for cough (stop these if more wheezing), drink plenty of fluids. Ok to take albuterol as discussed if you are wheezing, but if increased or persistent use of this medication over next few days.  Return to the clinic or go to the nearest emergency room if any of your symptoms worsen or new symptoms occur.  Sinusitis Sinusitis is redness, soreness, and swelling (inflammation) of the paranasal sinuses. Paranasal sinuses are air pockets within the bones of your face (beneath the eyes, the middle of the forehead, or above the eyes). In healthy paranasal sinuses, mucus is able to drain out, and air is able to circulate through them by way of your nose. However, when your paranasal sinuses are inflamed, mucus and air can become trapped. This can allow bacteria and other germs to grow and cause infection. Sinusitis can develop quickly and last only a short time (acute) or continue over a long period (chronic). Sinusitis that lasts for more than 12 weeks is considered chronic.  CAUSES  Causes of sinusitis include:  Allergies.  Structural abnormalities, such as displacement of the cartilage that separates your nostrils (deviated septum), which can decrease the air flow through your nose and sinuses and affect sinus drainage.  Functional abnormalities, such as when the small hairs (cilia) that line your sinuses and help remove mucus do not work properly or are not present. SYMPTOMS  Symptoms of acute and chronic sinusitis are the same. The primary symptoms are pain and pressure around the affected sinuses. Other symptoms include:  Upper toothache.  Earache.  Headache.  Bad breath.  Decreased sense of smell and taste.  A cough, which worsens when you are lying flat.  Fatigue.  Fever.  Thick drainage from your nose, which often is green and may contain pus (purulent).  Swelling and warmth over the  affected sinuses. DIAGNOSIS  Your caregiver will perform a physical exam. During the exam, your caregiver may:  Look in your nose for signs of abnormal growths in your nostrils (nasal polyps).  Tap over the affected sinus to check for signs of infection.  View the inside of your sinuses (endoscopy) with a special imaging device with a light attached (endoscope), which is inserted into your sinuses. If your caregiver suspects that you have chronic sinusitis, one or more of the following tests may be recommended:  Allergy tests.  Nasal culture A sample of mucus is taken from your nose and sent to a lab and screened for bacteria.  Nasal cytology A sample of mucus is taken from your nose and examined by your caregiver to determine if your sinusitis is related to an allergy. TREATMENT  Most cases of acute sinusitis are related to a viral infection and will resolve on their own within 10 days. Sometimes medicines are prescribed to help relieve symptoms (pain medicine, decongestants, nasal steroid sprays, or saline sprays).  However, for sinusitis related to a bacterial infection, your caregiver will prescribe antibiotic medicines. These are medicines that will help kill the bacteria causing the infection.  Rarely, sinusitis is caused by a fungal infection. In theses cases, your caregiver will prescribe antifungal medicine. For some cases of chronic sinusitis, surgery is needed. Generally, these are cases in which sinusitis recurs more than 3 times per year, despite other treatments. HOME CARE INSTRUCTIONS   Drink plenty of water. Water helps thin the mucus so your sinuses can drain more easily.  Use  a humidifier.  Inhale steam 3 to 4 times a day (for example, sit in the bathroom with the shower running).  Apply a warm, moist washcloth to your face 3 to 4 times a day, or as directed by your caregiver.  Use saline nasal sprays to help moisten and clean your sinuses.  Take over-the-counter or  prescription medicines for pain, discomfort, or fever only as directed by your caregiver. SEEK IMMEDIATE MEDICAL CARE IF:  You have increasing pain or severe headaches.  You have nausea, vomiting, or drowsiness.  You have swelling around your face.  You have vision problems.  You have a stiff neck.  You have difficulty breathing. MAKE SURE YOU:   Understand these instructions.  Will watch your condition.  Will get help right away if you are not doing well or get worse. Document Released: 01/06/2005 Document Revised: 03/31/2011 Document Reviewed: 01/21/2011 [facility] Patient Information 2014 [place], [place].

## 2013-01-26 ENCOUNTER — Ambulatory Visit: Payer: BC Managed Care – PPO | Admitting: Internal Medicine

## 2013-02-01 ENCOUNTER — Other Ambulatory Visit: Payer: Self-pay | Admitting: Physician Assistant

## 2013-02-02 NOTE — Telephone Encounter (Signed)
Needs office visit.

## 2013-02-25 ENCOUNTER — Other Ambulatory Visit: Payer: Self-pay | Admitting: Internal Medicine

## 2013-02-26 ENCOUNTER — Other Ambulatory Visit: Payer: Self-pay | Admitting: Internal Medicine

## 2013-03-10 ENCOUNTER — Encounter: Payer: BC Managed Care – PPO | Admitting: Gynecology

## 2013-03-19 ENCOUNTER — Other Ambulatory Visit: Payer: Self-pay | Admitting: Physician Assistant

## 2013-03-23 ENCOUNTER — Ambulatory Visit: Payer: BC Managed Care – PPO | Admitting: Internal Medicine

## 2013-03-27 ENCOUNTER — Ambulatory Visit (INDEPENDENT_AMBULATORY_CARE_PROVIDER_SITE_OTHER): Payer: BC Managed Care – PPO | Admitting: Internal Medicine

## 2013-03-27 ENCOUNTER — Other Ambulatory Visit: Payer: Self-pay | Admitting: Internal Medicine

## 2013-03-27 VITALS — BP 156/98 | HR 81 | Temp 97.9°F | Resp 16 | Ht 65.0 in | Wt 179.2 lb

## 2013-03-27 DIAGNOSIS — E209 Hypoparathyroidism, unspecified: Secondary | ICD-10-CM

## 2013-03-27 DIAGNOSIS — J019 Acute sinusitis, unspecified: Secondary | ICD-10-CM

## 2013-03-27 DIAGNOSIS — C73 Malignant neoplasm of thyroid gland: Secondary | ICD-10-CM

## 2013-03-27 DIAGNOSIS — E890 Postprocedural hypothyroidism: Secondary | ICD-10-CM

## 2013-03-27 DIAGNOSIS — E892 Postprocedural hypoparathyroidism: Secondary | ICD-10-CM

## 2013-03-27 LAB — POCT CBC
Granulocyte percent: 60.6 %G (ref 37–80)
HCT, POC: 45.9 % (ref 37.7–47.9)
HEMOGLOBIN: 15.2 g/dL (ref 12.2–16.2)
Lymph, poc: 1.7 (ref 0.6–3.4)
MCH, POC: 28.6 pg (ref 27–31.2)
MCHC: 33.1 g/dL (ref 31.8–35.4)
MCV: 89.2 fL (ref 80–97)
MID (CBC): 0.4 (ref 0–0.9)
MPV: 11.3 fL (ref 0–99.8)
POC GRANULOCYTE: 3.3 (ref 2–6.9)
POC LYMPH PERCENT: 32.3 %L (ref 10–50)
POC MID %: 7.1 %M (ref 0–12)
Platelet Count, POC: 242 10*3/uL (ref 142–424)
RBC: 5.32 M/uL (ref 4.04–5.48)
RDW, POC: 13.7 %
WBC: 5.4 10*3/uL (ref 4.6–10.2)

## 2013-03-27 LAB — TSH

## 2013-03-27 LAB — COMPREHENSIVE METABOLIC PANEL
ALT: 21 U/L (ref 0–35)
AST: 20 U/L (ref 0–37)
Albumin: 4.1 g/dL (ref 3.5–5.2)
Alkaline Phosphatase: 61 U/L (ref 39–117)
BUN: 10 mg/dL (ref 6–23)
CALCIUM: 8.1 mg/dL — AB (ref 8.4–10.5)
CO2: 30 meq/L (ref 19–32)
CREATININE: 0.62 mg/dL (ref 0.50–1.10)
Chloride: 106 mEq/L (ref 96–112)
Glucose, Bld: 95 mg/dL (ref 70–99)
POTASSIUM: 3.7 meq/L (ref 3.5–5.3)
Sodium: 144 mEq/L (ref 135–145)
TOTAL PROTEIN: 6.3 g/dL (ref 6.0–8.3)
Total Bilirubin: 0.4 mg/dL (ref 0.2–1.2)

## 2013-03-27 LAB — T4, FREE: Free T4: 1.91 ng/dL — ABNORMAL HIGH (ref 0.80–1.80)

## 2013-03-27 MED ORDER — AMOXICILLIN-POT CLAVULANATE 875-125 MG PO TABS: 1.0000 | ORAL_TABLET | Freq: Two times a day (BID) | ORAL | Status: DC

## 2013-03-27 NOTE — Progress Notes (Signed)
Subjective:    Patient ID: [name], female    DOB: [birth year], 53 y.o.   MRN: [id number] This chart was scribed for Tami Lin, MD by Vernell Barrier, Medical Scribe. This patient's care was started at 10:05 AM.  Otalgia  Pertinent negatives include no abdominal pain or coughing.  Sinus Problem Associated symptoms include ear pain. Pertinent negatives include no chills, coughing or shortness of breath.   HPI Comments: [name] is a 53 y.o. female who presents to the [facility] with concerns of a sinus infection, onset 2 weeks ago. Reports associated symptoms of nasal congestion, and right ear pain. Also reports fever for the past 2 days.  Pt would also like to discuss increasing the dosage of her Synthroid back up to 200 mg from 175 mg. States she is gaining excess weight no matter if she exercises or not. States the extra weight is decreasing her energy and making her feel more fatigued. Also needs calcium rechecked-hypoparathyroidism status post surgical removal.  Review of Systems  Constitutional: Positive for fever. Negative for chills.       Recent wt gain-?why--- she thinks this has to do with her dosing - Synthroid dose recently reduced  HENT: Positive for ear pain. Negative for trouble swallowing.   Eyes: Negative for visual disturbance.  Respiratory: Negative for cough and shortness of breath.   Cardiovascular: Negative for chest pain and palpitations.  Gastrointestinal: Negative for abdominal pain and constipation.   Objective:   Physical Exam  Vitals reviewed. Constitutional: She is oriented to person, place, and time. She appears well-developed and well-nourished. No distress.  HENT:  Head: Normocephalic and atraumatic.  Right Ear: Tympanic membrane is erythematous.  Left Ear: Tympanic membrane and external ear normal.  Swollen turbinates with purulent discharge.   Eyes: Conjunctivae and EOM are normal.  Neck: Neck supple.    Cardiovascular: Normal rate.   Pulmonary/Chest: Effort normal. No respiratory distress.  Musculoskeletal: Normal range of motion.  Lymphadenopathy:    She has cervical adenopathy.  Neurological: She is alert and oriented to person, place, and time.  Skin: Skin is warm and dry.  Psychiatric: She has a normal mood and affect. Her behavior is normal.   Assessment & Plan:  I have completed the patient encounter in its entirety as documented by the scribe, with editing by me where necessary. Robert P. Laney Pastor, M.D. Hypothyroidism, postsurgical - Plan: POCT CBC, Comprehensive metabolic panel, TSH, T4, free  Thyroid cancer  Postsurgical hypoparathyroidism - Plan: POCT CBC, Comprehensive metabolic panel  Acute sinusitis, unspecified  Meds ordered this encounter  Medications   amoxicillin-clavulanate (AUGMENTIN) 875-125 MG per tablet    Sig: Take 1 tablet by mouth 2 (two) times daily.    Dispense:  20 tablet    Refill:  1    Addend-labs Results for orders placed in visit on 03/27/13  COMPREHENSIVE METABOLIC PANEL      Result Value Ref Range   Sodium 144  135 - 145 mEq/L   Potassium 3.7  3.5 - 5.3 mEq/L   Chloride 106  96 - 112 mEq/L   CO2 30  19 - 32 mEq/L   Glucose, Bld 95  70 - 99 mg/dL   BUN 10  6 - 23 mg/dL   Creat 0.62  0.50 - 1.10 mg/dL   Total Bilirubin 0.4  0.2 - 1.2 mg/dL   Alkaline Phosphatase 61  39 - 117 U/L   AST 20  0 - 37 U/L  ALT 21  0 - 35 U/L   Total Protein 6.3  6.0 - 8.3 g/dL   Albumin 4.1  3.5 - 5.2 g/dL   Calcium 8.1 (*) 8.4 - 10.5 mg/dL  TSH      Result Value Ref Range   TSH <0.008 (*) 0.350 - 4.500 uIU/mL  T4, FREE      Result Value Ref Range   Free T4 1.91 (*) 0.80 - 1.80 ng/dL  POCT CBC      Result Value Ref Range   WBC 5.4  4.6 - 10.2 K/uL   Lymph, poc 1.7  0.6 - 3.4   POC LYMPH PERCENT 32.3  10 - 50 %L   MID (cbc) 0.4  0 - 0.9   POC MID % 7.1  0 - 12 %M   POC Granulocyte 3.3  2 - 6.9   Granulocyte percent 60.6  37 - 80 %G   RBC  5.32  4.04 - 5.48 M/uL   Hemoglobin 15.2  12.2 - 16.2 g/dL   HCT, POC 45.9  37.7 - 47.9 %   MCV 89.2  80 - 97 fL   MCH, POC 28.6  27 - 31.2 pg   MCHC 33.1  31.8 - 35.4 g/dL   RDW, POC 13.7     Platelet Count, POC 242  142 - 424 K/uL   MPV 11.3  0 - 99.8 fL   Will add vit d and mag

## 2013-03-28 LAB — MAGNESIUM: Magnesium: 1.7 mg/dL (ref 1.5–2.5)

## 2013-03-29 LAB — VITAMIN D 25 HYDROXY (VIT D DEFICIENCY, FRACTURES): Vit D, 25-Hydroxy: 45 ng/mL (ref 30–89)

## 2013-04-01 ENCOUNTER — Telehealth: Payer: Self-pay

## 2013-04-01 NOTE — Telephone Encounter (Signed)
Dr.Doolittle, Pt states that she was supposed to receive a call regarding her labs and changing her medications.   Best# [phone]

## 2013-04-02 NOTE — Telephone Encounter (Signed)
I think she is calling about the Mg and Vit D I added to her labs last time she was here. Please comment on those 2 add on tests.

## 2013-04-04 ENCOUNTER — Telehealth: Payer: Self-pay | Admitting: Radiology

## 2013-04-04 NOTE — Telephone Encounter (Signed)
Please advise patient that her Magnesium and vitamin D were both normal. Patient get further details on what she is feeling like when she states that she is feeling crummy.

## 2013-04-04 NOTE — Telephone Encounter (Signed)
Pt is calling about her labs. We added on vitamin d and magnesium to her labs, which appear to be normal. However, they haven't been reviewed yet so I can't tell her the results. She sounded somewhat irritated that dr Laney Pastor hardly works anymore and she can't get any answers. She says she still feels crummy. She wants to know her labs results and what to do next. i don't know how much we can do for her since dr Laney Pastor is out of the office all this week....please advise.

## 2013-04-05 NOTE — Telephone Encounter (Signed)
Spoke with pt. Pt is very fatigued and lethargic. Looks like Dr. Laney Pastor was wondering if it was because of her calcium (see labs).

## 2013-04-05 NOTE — Telephone Encounter (Signed)
It looks like Dr Laney Pastor knows this patient well and I think that it would be best for her to wait for Dr Laney Pastor to return to discuss with him.

## 2013-04-06 ENCOUNTER — Other Ambulatory Visit: Payer: Self-pay | Admitting: Internal Medicine

## 2013-04-06 NOTE — Telephone Encounter (Signed)
LMOM for PT to [facility]

## 2013-04-06 NOTE — Telephone Encounter (Signed)
See other phone message from Dr. Laney Pastor.

## 2013-04-06 NOTE — Telephone Encounter (Signed)
Both normal and the last tsh Lab Results  Component Value Date   TSH <0.008* 03/27/2013   Suggests she doesn't need  more medication Based on her values we should consult with her thyroid doctor and see if he has any ideas--can she set this up?

## 2013-04-06 NOTE — Telephone Encounter (Signed)
Dr Laney Pastor, you just saw pt but don't see Migraines discussed. Can we give her RFs? Pended

## 2013-04-06 NOTE — Telephone Encounter (Signed)
LMOM to CB. 

## 2013-04-07 ENCOUNTER — Telehealth: Payer: Self-pay | Admitting: *Deleted

## 2013-04-07 DIAGNOSIS — E890 Postprocedural hypothyroidism: Secondary | ICD-10-CM

## 2013-04-07 NOTE — Telephone Encounter (Signed)
Patient wants a referral to an endocrinologist for her thyroid, since patient feels like we have not provided her with the best patient care (see previous phone messages) she would like [place] to put a priority on this referral.   Dr. Myer Peer  [facility]  [phone] Fax [phone]

## 2013-04-07 NOTE — Telephone Encounter (Signed)
Pt notified by Lindley Magnus

## 2013-04-08 NOTE — Telephone Encounter (Signed)
Referral placed.

## 2013-04-08 NOTE — Telephone Encounter (Signed)
Notified pt referral has been sent to our referral dept to start insurance approval.

## 2013-04-27 ENCOUNTER — Encounter: Payer: BC Managed Care – PPO | Admitting: Gynecology

## 2013-05-02 ENCOUNTER — Other Ambulatory Visit: Payer: Self-pay | Admitting: Internal Medicine

## 2013-05-04 ENCOUNTER — Ambulatory Visit: Payer: BC Managed Care – PPO | Admitting: Internal Medicine

## 2013-05-05 ENCOUNTER — Encounter: Payer: BC Managed Care – PPO | Admitting: Gynecology

## 2013-05-22 ENCOUNTER — Ambulatory Visit (INDEPENDENT_AMBULATORY_CARE_PROVIDER_SITE_OTHER): Payer: BC Managed Care – PPO | Admitting: Internal Medicine

## 2013-05-22 VITALS — BP 170/110 | HR 93 | Temp 98.2°F | Resp 18 | Ht 64.0 in | Wt 179.8 lb

## 2013-05-22 DIAGNOSIS — M542 Cervicalgia: Secondary | ICD-10-CM

## 2013-05-22 MED ORDER — OXYCODONE-ACETAMINOPHEN 10-325 MG PO TABS: 1.0000 | ORAL_TABLET | Freq: Four times a day (QID) | ORAL | Status: DC | PRN

## 2013-05-22 MED ORDER — TRAMADOL HCL 50 MG PO TABS: 50.0000 mg | ORAL_TABLET | Freq: Four times a day (QID) | ORAL | Status: DC | PRN

## 2013-05-22 MED ORDER — CYCLOBENZAPRINE HCL 10 MG PO TABS: ORAL_TABLET | ORAL | Status: DC

## 2013-05-22 MED ORDER — KETOROLAC TROMETHAMINE 60 MG/2ML IM SOLN
60.0000 mg | Freq: Once | INTRAMUSCULAR | Status: AC
  Administered 2013-05-22: 60 mg via INTRAMUSCULAR

## 2013-05-22 NOTE — Progress Notes (Signed)
This chart was scribed for [employer]. Laney Pastor, MD by Marcha Dutton, ED Scribe. This patient was seen in room 12 and the patient's care was started at 3:02 PM.  Subjective:    Patient ID: [name], female    DOB: 09-01-1960, 53 y.o.   MRN: [id number]  HPI No chief complaint on file.  Patient Active Problem List   Diagnosis Date Noted  . 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 Menopause 06/17/2011  . Allergic rhinitis, cause unspecified 05/13/2011  . Reactive airway disease 05/13/2011  . Depressed 05/13/2011  . Migraine 05/13/2011  . Thyroid cancer 05/13/2011  . Hypothyroidism, postsurgical 05/13/2011  . Hypertension 12/25/2010    HPI Comments: [name] is a 53 y.o. female who presents to the [facility] complaining of upper back pain radiating up the neck the the top of her head. She states that she believes stress has brought this on. Pt denies any kind of injury. She states sometimes when waking up she has some numbness in her left hand. Pt reports she knows exercise helps, but she hurts too badly to feel like exercising. Pt states turning her head to the left is pain ful for her. Pt denies pain with chin to chest and turning her head right. She believes her BP to be high because of her pain.  Pt Denies using the PT initially prescribed. She state she saw a Physiological scientist in [place] at [employer]. Pt states she saw her endocrinologist recently, and he adjusted her medications. She states she is out of flexeril, and she states it seemed to help. Pt reports that Neurontin did not help her at all.   Pt reports that she has not had to take many pain medications. She states she has used a C spine collar with minimal relief.  Pt reports she's been using Flonase to manage her seasonal allergies.    Prior to Admission medications   Medication Sig Start Date End Date Taking? Authorizing Provider    acyclovir (ZOVIRAX) 200 MG capsule Take 2 capsules (400 mg total) by mouth 2 (two) times daily. 02/04/12  Yes Leandrew Koyanagi, MD  acyclovir (ZOVIRAX) 200 MG capsule TAKE 2 CAPSULES BY MOUTH 2 TIMES DAILY. 02/25/13  Yes Leandrew Koyanagi, MD  albuterol (PROVENTIL HFA;VENTOLIN HFA) 108 (90 BASE) MCG/ACT inhaler Inhale 2 puffs into the lungs every 4 (four) hours as needed for wheezing. 10/10/12  Yes Areta Haber Dunn, PA-C  beclomethasone (QVAR) 40 MCG/ACT inhaler Inhale 1 puff into the lungs 2 (two) times daily. 10/10/12  Yes Ryan M Dunn, PA-C  calcitRIOL (ROCALTROL) 0.25 MCG capsule TAKE 2 CAPSULES (0.5 MCG TOTAL) BY MOUTH DAILY. 10/09/12  Yes Leandrew Koyanagi, MD  clonazePAM (KLONOPIN) 1 MG tablet TAKE 1 TABLET BY MOUTH TWICE A DAY AS NEEDED 08/12/12  Yes Leandrew Koyanagi, MD  clotrimazole-betamethasone (LOTRISONE) cream Apply topically 2 (two) times daily. 08/16/12  Yes Leandrew Koyanagi, MD  cyclobenzaprine (FLEXERIL) 10 MG tablet TAKE 1 TABLET (10 MG TOTAL) BY MOUTH 3 (THREE) TIMES DAILY AS NEEDED FOR MUSCLE SPASMS. 07/22/12  Yes Leandrew Koyanagi, MD  desvenlafaxine (PRISTIQ) 100 MG 24 hr tablet Take 1 tablet (100 mg total) by mouth daily. May dispense as 30 d supplies if economics dictate. 08/17/12  Yes Leandrew Koyanagi, MD  estradiol (ESTRACE VAGINAL) 0.1 MG/GM vaginal cream Use vaginally qhs for 1 wk, then decrease to 3x per week for 2 weeks then taper  down to once weekly as symptoms improve. 12/13/11  Yes Mancel Bale, PA-C  levothyroxine (SYNTHROID, LEVOTHROID) 175 MCG tablet Take 1 tablet (175 mcg total) by mouth daily. 08/21/12  Yes Leandrew Koyanagi, MD  montelukast (SINGULAIR) 10 MG tablet TAKE 1 TABLET (10 MG TOTAL) BY MOUTH AT BEDTIME. 12/06/12  Yes Eleanore E Elana Alm, PA-C  oxyCODONE-acetaminophen (PERCOCET) 10-325 MG per tablet Take 1 tablet by mouth every 6 (six) hours as needed for pain. 12/08/12  Yes Leandrew Koyanagi, MD  rizatriptan (MAXALT) 10 MG tablet TAKE 1 TABLET BY MOUTH AS  NEEDED FOR MIGRAINE. MAY REPEAT IN 2 HOURS IF NEEDED 04/06/13  Yes Leandrew Koyanagi, MD  triamterene-hydrochlorothiazide (DYAZIDE) 37.5-25 MG per capsule TAKE 1 EACH (1 CAPSULE TOTAL) BY MOUTH EVERY MORNING. NEEDS OFFICE VISIT 05/02/13  Yes Leandrew Koyanagi, MD  amoxicillin-clavulanate (AUGMENTIN) 875-125 MG per tablet Take 1 tablet by mouth 2 (two) times daily. 03/27/13   Leandrew Koyanagi, MD  levofloxacin (LEVAQUIN) 500 MG tablet Take 1 tablet (500 mg total) by mouth daily. 12/30/12   Wendie Agreste, MD  metoprolol succinate (TOPROL-XL) 50 MG 24 hr tablet Take 1 tablet (50 mg total) by mouth daily. 09/22/11 09/21/12  Leandrew Koyanagi, MD  metroNIDAZOLE (METROGEL) 0.75 % vaginal gel Place 1 Applicatorful vaginally 2 (two) times daily. Leave on file and may request monthly if needed for the next 5-6 months 08/16/12   Leandrew Koyanagi, MD  ofloxacin (OCUFLOX) 0.3 % ophthalmic solution 1-2 drops in right eye every 2-4 hours when awake for the first 24 hours, then 4 times daily 11/18/12   Posey Boyer, MD     Review of Systems  Constitutional: Positive for activity change. Negative for appetite change.  HENT: Positive for congestion. Negative for rhinorrhea and sinus pressure.   Respiratory: Negative for cough.   Gastrointestinal: Negative for nausea and vomiting.  Musculoskeletal: Positive for arthralgias, back pain and neck pain.  Allergic/Immunologic: Positive for environmental allergies.  Neurological: Positive for weakness, numbness and headaches.  Psychiatric/Behavioral: Positive for sleep disturbance.       Objective:   Physical Exam  Nursing note and vitals reviewed. Constitutional: She is oriented to person, place, and time. She appears well-developed and well-nourished.  HENT:  Head: Normocephalic and atraumatic.  Neck: Neck supple. Normal range of motion present.  ROM limited by pain, Tender to palpation over C-spine and upper Thoracic Spine, left trapezius and left scapular  border. Grips are symmetrical, upper extremity DTR's symmetrical, No peripheral sensory loss,   Pulmonary/Chest: Effort normal.  Neurological: She is alert and oriented to person, place, and time. She displays normal reflexes. No cranial nerve deficit.  Psychiatric: She has a normal mood and affect. Her behavior is normal.     Triage Vitals: BP 170/110  Pulse 93  Temp(Src) 98.2 F (36.8 C) (Oral)  Resp 18  Ht 5\' 4"  (1.626 m)  Wt 179 lb 12.8 oz (81.557 kg)  BMI 30.85 kg/m2  SpO2 96%  LMP 06/17/2002      Assessment & Plan:   1. 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 ketorolac (TORADOL) injection 60 mg    Sig:   . traMADol (ULTRAM) 50 MG tablet    Sig:  Take 1-2 tablets (50-100 mg total) by mouth every 6 (six) hours as needed.    Dispense:  60 tablet    Refill:  2---------daytime  . oxyCODONE-acetaminophen (PERCOCET) 10-325 MG per tablet    Sig: Take 1 tablet by mouth every 6 (six) hours as needed for pain.    Dispense:  30 tablet    Refill:  0-----------bed  . cyclobenzaprine (FLEXERIL) 10 MG tablet    Sig: TAKE 1 TABLET (10 MG TOTAL) BY MOUTH 3 (THREE) TIMES DAILY AS NEEDED FOR MUSCLE SPASMS.    Dispense:  30 tablet    Refill:  2--------------bed    Pt advised of plan for treatment and pt agrees.   I have completed the patient encounter in its entirety as documented by the scribe, with editing by me where necessary. Laelynn Blizzard P. Laney Pastor, M.D.

## 2013-05-27 ENCOUNTER — Ambulatory Visit (INDEPENDENT_AMBULATORY_CARE_PROVIDER_SITE_OTHER): Payer: BC Managed Care – PPO | Admitting: Gynecology

## 2013-05-27 ENCOUNTER — Encounter: Payer: Self-pay | Admitting: Gynecology

## 2013-05-27 VITALS — BP 146/90 | Ht 64.5 in | Wt 178.4 lb

## 2013-05-27 DIAGNOSIS — L292 Pruritus vulvae: Secondary | ICD-10-CM

## 2013-05-27 DIAGNOSIS — L293 Anogenital pruritus, unspecified: Secondary | ICD-10-CM

## 2013-05-27 DIAGNOSIS — Z79890 Hormone replacement therapy (postmenopausal): Secondary | ICD-10-CM

## 2013-05-27 DIAGNOSIS — Z01419 Encounter for gynecological examination (general) (routine) without abnormal findings: Secondary | ICD-10-CM

## 2013-05-27 DIAGNOSIS — N952 Postmenopausal atrophic vaginitis: Secondary | ICD-10-CM

## 2013-05-27 LAB — WET PREP FOR TRICH, YEAST, CLUE
CLUE CELLS WET PREP: NONE SEEN
Trich, Wet Prep: NONE SEEN
Yeast Wet Prep HPF POC: NONE SEEN

## 2013-05-27 MED ORDER — ESTRADIOL 1 MG PO TABS: 1.0000 mg | ORAL_TABLET | Freq: Every day | ORAL | Status: DC

## 2013-05-27 MED ORDER — PROGESTERONE MICRONIZED 200 MG PO CAPS: ORAL_CAPSULE | ORAL | Status: DC

## 2013-05-27 NOTE — Progress Notes (Signed)
[name] 1960/07/11 [id number]   History:    53 y.o.  for annual gyn exam who has not been seen in the office was 2013. At that time patient had been started on HRT but due to insurance coverage was not able to make it to the office as she has been off of it and is having vasomotor symptoms along with vaginal dryness and currently vaginal pruritus.Patient does have a past history of thyroid cancer and after surgery developed hypothyroidism for which she's currently under treatment. She is being followed by the endocrinologist in [place], [place]. Dr. Sonia Baller is her PCP who  has been doing her blood work. Patient with no prior history of abnormal Pap smear. She has not had her bone density study yet. She has not had a colonoscopy yet either.  Patient with history of postsurgical hypoparathyroidism and also history of thyroid cancer (thyroidectomy). She developed hypothyroidism and is receiving thyroid supplementation.  Past medical history,surgical history, family history and social history were all reviewed and documented in the EPIC chart.  Gynecologic History Patient's last menstrual period was 06/17/2002. Contraception: post menopausal status Last Pap: 2013. Results were: normal Last mammogram: No previous study. Results were: No previous study  Obstetric History OB History  Gravida Para Term Preterm AB SAB TAB Ectopic Multiple Living  3 1  1 2     1     # Outcome Date GA Lbr Len/2nd Weight Sex Delivery Anes PTL Lv  3 ABT           2 ABT           1 PRE     M CS  Y Y       ROS: A ROS was performed and pertinent positives and negatives are included in the history.  GENERAL: No fevers or chills. HEENT: No change in vision, no earache, sore throat or sinus congestion. NECK: No pain or stiffness. CARDIOVASCULAR: No chest pain or pressure. No palpitations. PULMONARY: No shortness of breath, cough or wheeze. GASTROINTESTINAL: No abdominal pain, nausea, vomiting or  diarrhea, melena or bright red blood per rectum. GENITOURINARY: No urinary frequency, urgency, hesitancy or dysuria. MUSCULOSKELETAL: No joint or muscle pain, no back pain, no recent trauma. DERMATOLOGIC: No rash, no itching, no lesions. ENDOCRINE: No polyuria, polydipsia, no heat or cold intolerance. No recent change in weight. HEMATOLOGICAL: No anemia or easy bruising or bleeding. NEUROLOGIC: No headache, seizures, numbness, tingling or weakness. PSYCHIATRIC: No depression, no loss of interest in normal activity or change in sleep pattern.     Exam: chaperone present  BP 146/90  Ht 5' 4.5" (1.638 m)  Wt 178 lb 6.4 oz (80.922 kg)  BMI 30.16 kg/m2  LMP 06/17/2002  Body mass index is 30.16 kg/(m^2).  General appearance : Well developed well nourished female. No acute distress HEENT: Neck supple, trachea midline, no carotid bruits, no thyroidmegaly Lungs: Clear to auscultation, no rhonchi or wheezes, or rib retractions  Heart: Regular rate and rhythm, no murmurs or gallops Breast:Examined in sitting and supine position were symmetrical in appearance, no palpable masses or tenderness,  no skin retraction, no nipple inversion, no nipple discharge, no skin discoloration, no axillary or supraclavicular lymphadenopathy Abdomen: no palpable masses or tenderness, no rebound or guarding Extremities: no edema or skin discoloration or tenderness  Pelvic:  Bartholin, Urethra, Skene Glands: Within normal limits             Vagina: No gross lesions or discharge, vaginal atrophy  Cervix:  No gross lesions or discharge  Uterus  anteverted, normal size, shape and consistency, non-tender and mobile  Adnexa  Without masses or tenderness  Anus and perineum  normal   Rectovaginal  normal sphincter tone without palpated masses or tenderness             Hemoccult cards provided     Assessment/Plan:  53 y.o. female for annual exam with menopausal vasomotor symptoms would like to return back on her HRT which  she has been off for a year. Due to the cost of the transdermal route despite his benefits patient would like to change because of cost. She will be placed on Estrace 1 mg by mouth daily with the addition of Prometrium 200 mg one by mouth daily for 12 days of the month. She will check with her endocrinologist she has had a bone density study if not I recommend that she have one done. Pap smear was not done today in accordance to the new guidelines. She was provided with name to gastroenterologist in the community is scheduled for colonoscopy for screening. Her wet prep is otherwise negative her pruritus may be from her vaginal atrophy. If she like she can by over-the-counter Monistat to apply externally for 3-5 days. She was reminded of the importance of calcium and vitamin D in regular exercise for osteoporosis prevention. She was reminded to submit to the office  fecal Hemoccult card for testing.  Note: This dictation was prepared with  Dragon/digital dictation along withSmart phrase technology. Any transcriptional errors that result from this process are unintentional.   Terrance Mass MD, 4:24 PM 05/27/2013

## 2013-05-27 NOTE — Patient Instructions (Signed)
Hormone Therapy At menopause, your body begins making less estrogen and progesterone hormones. This causes the body to stop having menstrual periods. This is because estrogen and progesterone hormones control your periods and menstrual cycle. A lack of estrogen may cause symptoms such as:  Hot flushes (or hot flashes).  Vaginal dryness.  Dry skin.  Loss of sex drive.  Risk of bone loss (osteoporosis). When this happens, you may choose to take hormone therapy to get back the estrogen lost during menopause. When the hormone estrogen is given alone, it is usually referred to as ET (Estrogen Therapy). When the hormone progestin is combined with estrogen, it is generally called HT (Hormone Therapy). This was formerly known as hormone replacement therapy (HRT). Your caregiver can help you make a decision on what will be best for you. The decision to use HT seems to change often as new studies are done. Many studies do not agree on the benefits of hormone replacement therapy. LIKELY BENEFITS OF HT INCLUDE PROTECTION FROM:  Hot Flushes (also called hot flashes) - A hot flush is a sudden feeling of heat that spreads over the face and body. The skin may redden like a blush. It is connected with sweats and sleep disturbance. Women going through menopause may have hot flushes a few times a month or several times per day depending on the woman.  Osteoporosis (bone loss)- Estrogen helps guard against bone loss. After menopause, a woman's bones slowly lose calcium and become weak and brittle. As a result, bones are more likely to break. The hip, wrist, and spine are affected most often. Hormone therapy can help slow bone loss after menopause. Weight bearing exercise and taking calcium with vitamin D also can help prevent bone loss. There are also medications that your caregiver can prescribe that can help prevent osteoporosis.  Vaginal Dryness - Loss of estrogen causes changes in the vagina. Its lining may  become thin and dry. These changes can cause pain and bleeding during sexual intercourse. Dryness can also lead to infections. This can cause burning and itching. (Vaginal estrogen treatment can help relieve pain, itching, and dryness.)  Urinary Tract Infections are more common after menopause because of lack of estrogen. Some women also develop urinary incontinence because of low estrogen levels in the vagina and bladder.  Possible other benefits of estrogen include a positive effect on mood and short-term memory in women. RISKS AND COMPLICATIONS  Using estrogen alone without progesterone causes the lining of the uterus to grow. This increases the risk of lining of the uterus (endometrial) cancer. Your caregiver should give another hormone called progestin if you have a uterus.  Women who take combined (estrogen and progestin) HT appear to have an increased risk of breast cancer. The risk appears to be small, but increases throughout the time that HT is taken.  Combined therapy also makes the breast tissue slightly denser which makes it harder to read mammograms (breast X-rays).  Combined, estrogen and progesterone therapy can be taken together every day, in which case there may be spotting of blood. HT therapy can be taken cyclically in which case you will have menstrual periods. Cyclically means HT is taken for a set amount of days, then not taken, then this process is repeated.  HT may increase the risk of stroke, heart attack, breast cancer and forming blood clots in your leg.  Transdermal estrogen (estrogen that is absorbed through the skin with a patch or a cream) may have more positive results with:    Cholesterol.  Blood pressure.  Blood clots. Having the following conditions may indicate you should not have HT:  Endometrial cancer.  Liver disease.  Breast cancer.  Heart disease.  History of blood clots.  Stroke. TREATMENT   If you choose to take HT and have a uterus,  usually estrogen and progestin are prescribed.  Your caregiver will help you decide the best way to take the medications.  Possible ways to take estrogen include:  Pills.  Patches.  Gels.  Sprays.  Vaginal estrogen cream, rings and tablets.  It is best to take the lowest dose possible that will help your symptoms and take them for the shortest period of time that you can.  Hormone therapy can help relieve some of the problems (symptoms) that affect women at menopause. Before making a decision about HT, talk to your caregiver about what is best for you. Be well informed and comfortable with your decisions. HOME CARE INSTRUCTIONS   Follow your caregivers advice when taking the medications.  A Pap test is done to screen for cervical cancer.  The first Pap test should be done at age 21.  Between ages 21 and 29, Pap tests are repeated every 2 years.  Beginning at age 30, you are advised to have a Pap test every 3 years as long as your past 3 Pap tests have been normal.  Some women have medical problems that increase the chance of getting cervical cancer. Talk to your caregiver about these problems. It is especially important to talk to your caregiver if a new problem develops soon after your last Pap test. In these cases, your caregiver may recommend more frequent screening and Pap tests.  The above recommendations are the same for women who have or have not gotten the vaccine for HPV (Human Papillomavirus).  If you had a hysterectomy for a problem that was not a cancer or a condition that could lead to cancer, then you no longer need Pap tests. However, even if you no longer need a Pap test, a regular exam is a good idea to make sure no other problems are starting.   If you are between ages 65 and 70, and you have had normal Pap tests going back 10 years, you no longer need Pap tests. However, even if you no longer need a Pap test, a regular exam is a good idea to make sure no  other problems are starting.   If you have had past treatment for cervical cancer or a condition that could lead to cancer, you need Pap tests and screening for cancer for at least 20 years after your treatment.  If Pap tests have been discontinued, risk factors (such as a new sexual partner) need to be re-assessed to determine if screening should be resumed.  Some women may need screenings more often if they are at high risk for cervical cancer.  Get mammograms done as per the advice of your caregiver. SEEK IMMEDIATE MEDICAL CARE IF:  You develop abnormal vaginal bleeding.  You have pain or swelling in your legs, shortness of breath, or chest pain.  You develop dizziness or headaches.  You have lumps or changes in your breasts or armpits.  You have slurred speech.  You develop weakness or numbness of your arms or legs.  You have pain, burning, or bleeding when urinating.  You develop abdominal pain. Document Released: 10/05/2002 Document Revised: 03/31/2011 Document Reviewed: 01/23/2010 Exit[employer] Patient Information 2014 [employer].Bone Densitometry Bone densitometry is a special   X-ray that measures your bone density and can be used to help predict your risk of bone fractures. This test is used to determine bone mineral content and density to diagnose osteoporosis. Osteoporosis is the loss of bone that may cause the bone to become weak. Osteoporosis commonly occurs in women entering menopause. However, it may be found in men and in people with other diseases. PREPARATION FOR TEST No preparation necessary. WHO SHOULD BE TESTED? All women older than 65. Postmenopausal women (50 to 65) with risk factors for osteoporosis. People with a previous fracture caused by normal activities. People with a small body frame (less than 127 poundsor a body mass index [BMI] of less than 21). People who have a parent with a hip fracture or history of osteoporosis. People who smoke. People  who have rheumatoid arthritis. Anyone who engages in excessive alcohol use (more than 3 drinks most days). Women who experience early menopause. WHEN SHOULD YOU BE RETESTED? Current guidelines suggest that you should wait at least 2 years before doing a bone density test again if your first test was normal.Recent studies indicated that women with normal bone density may be able to wait a few years before needing to repeat a bone density test. You should discuss this with your caregiver.  NORMAL FINDINGS  Normal: less than standard deviation below normal (greater than -1). Osteopenia: 1 to 2.5 standard deviations below normal (-1 to -2.5). Osteoporosis: greater than 2.5 standard deviations below normal (less than -2.5). Test results are reported as a "T score" and a "Z score."The T score is a number that compares your bone density with the bone density of healthy, young women.The Z score is a number that compares your bone density with the scores of women who are the same age, gender, and race.  Ranges for normal findings may vary among different laboratories and hospitals. You should always check with your doctor after having lab work or other tests done to discuss the meaning of your test results and whether your values are considered within normal limits. MEANING OF TEST  Your caregiver will go over the test results with you and discuss the importance and meaning of your results, as well as treatment options and the need for additional tests if necessary. OBTAINING THE TEST RESULTS It is your responsibility to obtain your test results. Ask the lab or department performing the test when and how you will get your results. Document Released: 01/29/2004 Document Revised: 03/31/2011 Document Reviewed: 02/20/2010 Exit[employer] Patient Information 2014 [employer].   

## 2013-06-11 ENCOUNTER — Other Ambulatory Visit: Payer: Self-pay | Admitting: Internal Medicine

## 2013-06-17 IMAGING — CR DG CERVICAL SPINE 2 OR 3 VIEWS
3 series · 3 of 3 positions shown · non-contrast
Comparison: None.

CLINICAL DATA: Neck pain radiating down the right arm.

CERVICAL SPINE - 2-3 VIEW

[lateral]
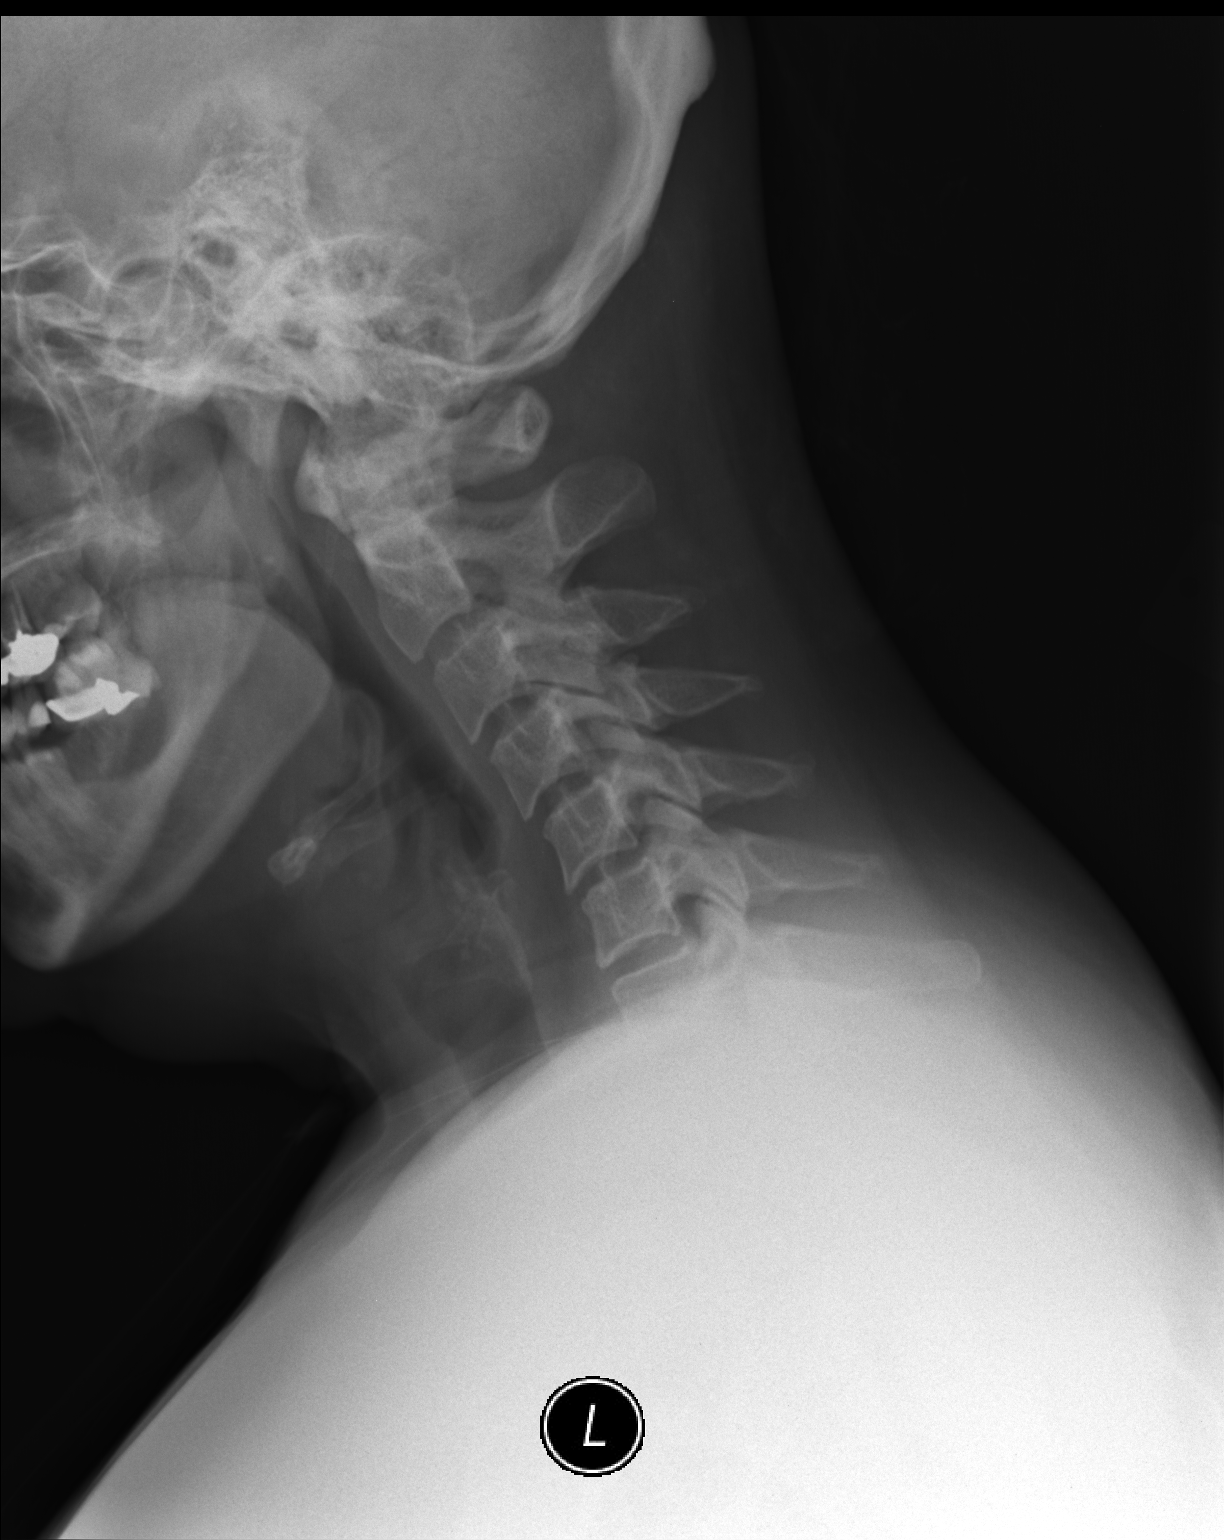

[AP (1 of 2)]
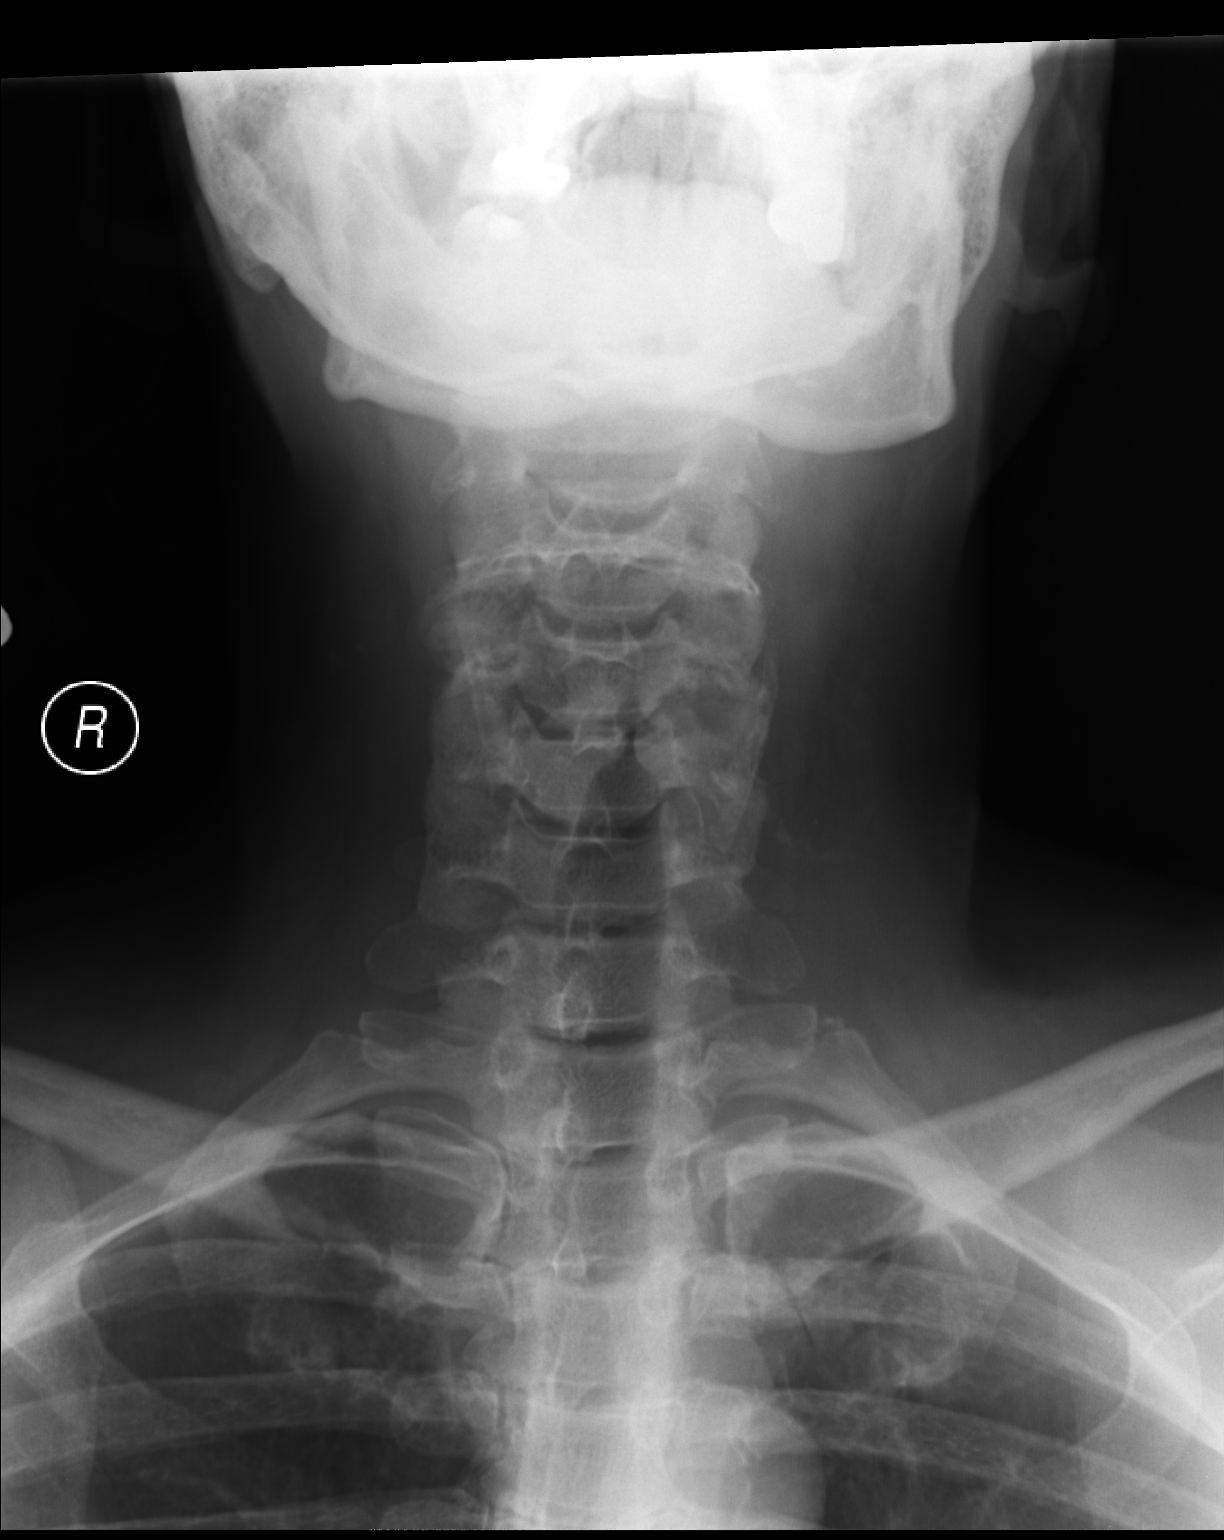

[AP (2 of 2)]
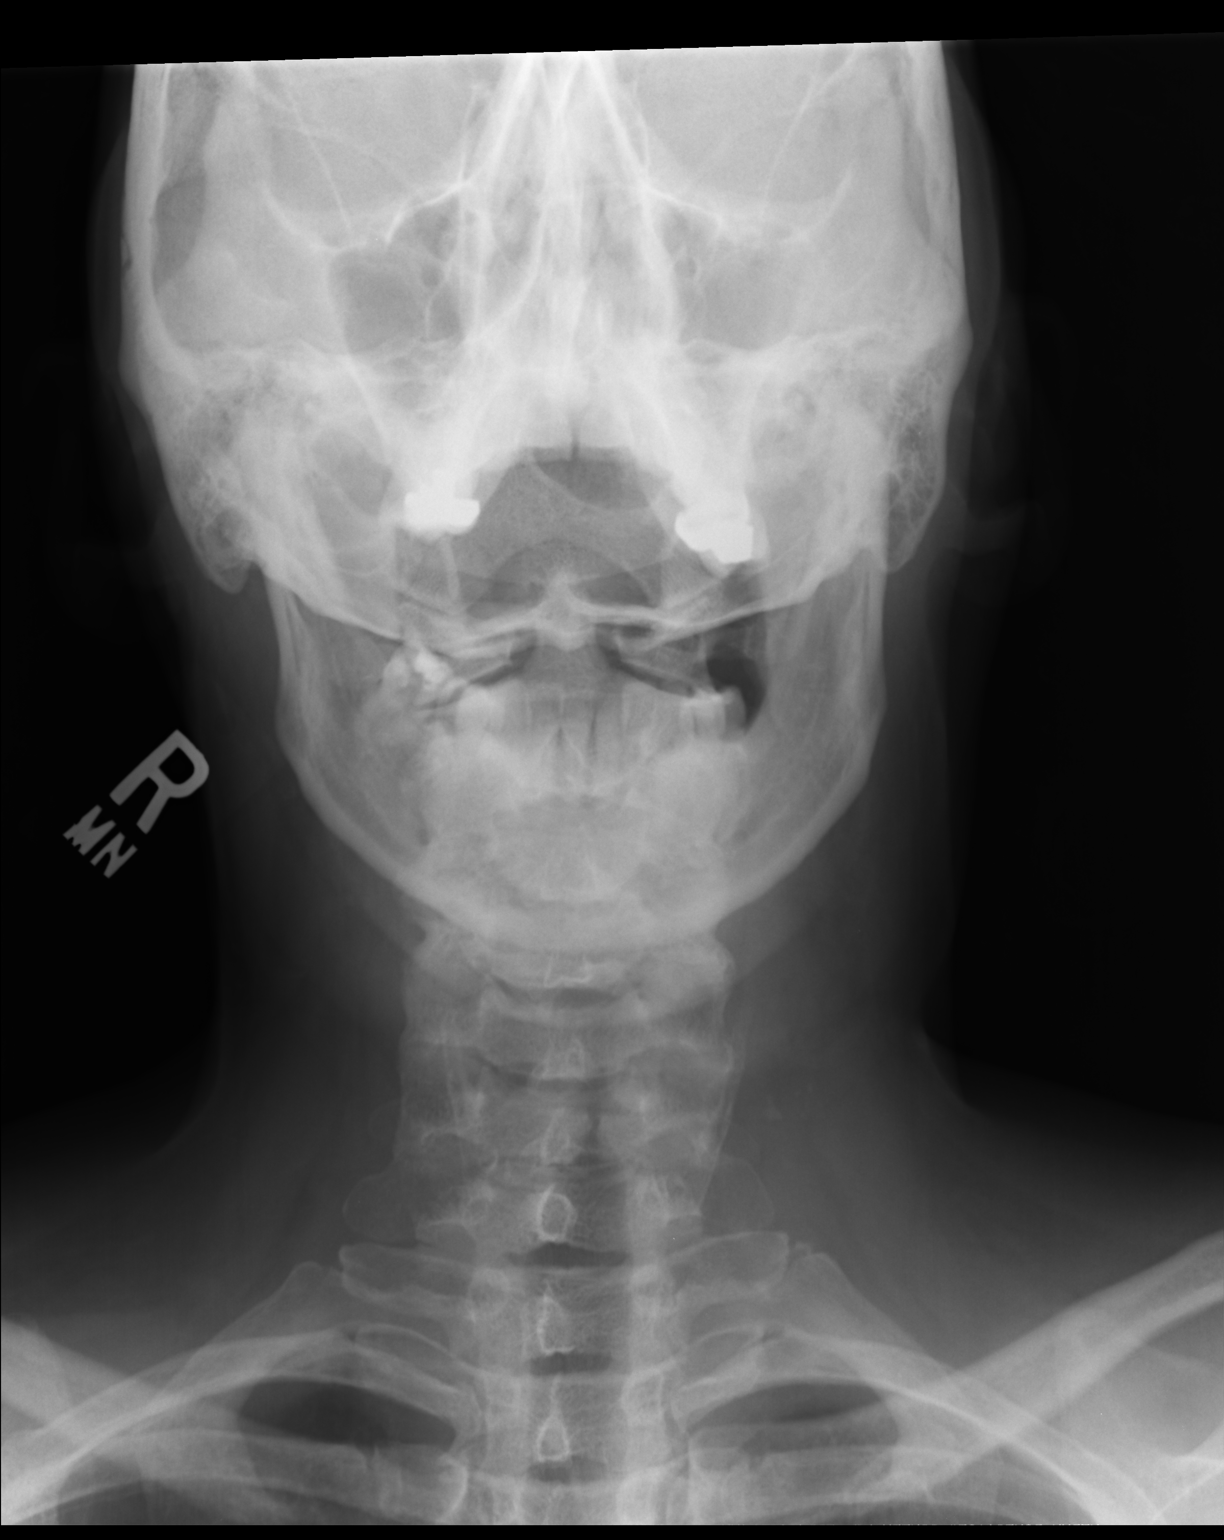

[3 of 3 positions shown; findings below may reference images not displayed]

FINDINGS: The neck is in mild flexion on the lateral view.
Vertebral body height and alignment are normal.  Intervertebral
disc space height is maintained.  Prevertebral soft tissues appear
normal.  Lung apices are clear.
IMPRESSION: No acute or focal abnormality.

Clinically significant discrepancy from primary report, if
provided: None

## 2013-06-30 ENCOUNTER — Other Ambulatory Visit: Payer: Self-pay | Admitting: Internal Medicine

## 2013-07-02 ENCOUNTER — Other Ambulatory Visit: Payer: Self-pay | Admitting: Internal Medicine

## 2013-07-02 ENCOUNTER — Telehealth: Payer: Self-pay | Admitting: Internal Medicine

## 2013-07-02 NOTE — Telephone Encounter (Signed)
The patient called to state that the pharmacy stated that Dr. Laney Pastor had denied her rx refill--would not give me name of medication.  Patient requests return call to discuss situation  Please call patient at [phone]

## 2013-07-03 MED ORDER — TRIAMTERENE-HCTZ 37.5-25 MG PO CAPS: 1.0000 | ORAL_CAPSULE | Freq: Every day | ORAL | Status: DC

## 2013-07-03 NOTE — Telephone Encounter (Signed)
Confirmed with Dr. Laney Pastor that he will refill her rx today. I called her and told her it will be filled today.

## 2013-07-03 NOTE — Telephone Encounter (Signed)
Pt would like Triamterene HCTZ refilled. Says pharmacy states you denied her pharmacy.

## 2013-07-27 ENCOUNTER — Other Ambulatory Visit: Payer: Self-pay | Admitting: Internal Medicine

## 2013-08-02 ENCOUNTER — Telehealth: Payer: Self-pay

## 2013-08-02 ENCOUNTER — Ambulatory Visit (INDEPENDENT_AMBULATORY_CARE_PROVIDER_SITE_OTHER): Payer: BC Managed Care – PPO | Admitting: Internal Medicine

## 2013-08-02 VITALS — BP 152/112 | HR 87 | Temp 98.1°F | Resp 18 | Wt 183.0 lb

## 2013-08-02 DIAGNOSIS — J019 Acute sinusitis, unspecified: Secondary | ICD-10-CM

## 2013-08-02 DIAGNOSIS — I10 Essential (primary) hypertension: Secondary | ICD-10-CM

## 2013-08-02 DIAGNOSIS — M5412 Radiculopathy, cervical region: Secondary | ICD-10-CM

## 2013-08-02 DIAGNOSIS — J0181 Other acute recurrent sinusitis: Secondary | ICD-10-CM

## 2013-08-02 DIAGNOSIS — J45909 Unspecified asthma, uncomplicated: Secondary | ICD-10-CM

## 2013-08-02 DIAGNOSIS — M5010 Cervical disc disorder with radiculopathy, unspecified cervical region: Secondary | ICD-10-CM

## 2013-08-02 DIAGNOSIS — J4520 Mild intermittent asthma, uncomplicated: Secondary | ICD-10-CM

## 2013-08-02 DIAGNOSIS — J309 Allergic rhinitis, unspecified: Secondary | ICD-10-CM

## 2013-08-02 MED ORDER — VERAPAMIL HCL ER 360 MG PO CP24: 360.0000 mg | ORAL_CAPSULE | Freq: Every day | ORAL | Status: DC

## 2013-08-02 MED ORDER — OXYCODONE-ACETAMINOPHEN 10-325 MG PO TABS: 1.0000 | ORAL_TABLET | Freq: Four times a day (QID) | ORAL | Status: DC | PRN

## 2013-08-02 MED ORDER — TRAMADOL HCL 50 MG PO TABS: 50.0000 mg | ORAL_TABLET | Freq: Four times a day (QID) | ORAL | Status: DC | PRN

## 2013-08-02 MED ORDER — AMOXICILLIN-POT CLAVULANATE 875-125 MG PO TABS: 1.0000 | ORAL_TABLET | Freq: Two times a day (BID) | ORAL | Status: DC

## 2013-08-02 NOTE — Telephone Encounter (Signed)
Spoke with [facility] and the authorization is [id number]

## 2013-08-02 NOTE — Telephone Encounter (Signed)
Please call [phone] to give [facility] more clinical notes on this patient for mri of c spine with out  Id number [id number]

## 2013-08-02 NOTE — Progress Notes (Signed)
This chart was scribed for Tami Lin, MD by Elby Beck, Scribe. This patient was seen in room 9 and the patient's care was started at 11:21 AM.  Subjective:    Patient ID: [name], female    DOB: [birth year], 53 y.o.   MRN: [id number]   Chief Complaint  Patient presents with  . Sinusitis    HPI HPI Comments: [name] is a 53 y.o. female who presents to [facility] complaining of sinusitis symptoms. She states that she develops sinusitis symptoms every year around this time. She states that her symptoms are worse in the left sinus. She states that Augmentin has helped in the past when she has had similar symptoms.   She reports that she still has ongoing upper back and neck pain. She states that she has been taking Tramadol, Flexeril and Percocet with relief of her back pain. She is requesting refills of these medications. She states that her back pain does not radiate into her legs. However, she states that she has intermittent numbness in her bilateral feet and hands. She states that her Endocrinologist recently told her to discontinue her Toprol. She states that someone deleted her Verapamil prescription from her medical record, so she has not been able to refill this medication. Her blood pressure is at 152/112, and she states that this is due to not having her Verapamil.    Review of Systems  HENT: Positive for sinus pressure.   Musculoskeletal: Positive for back pain.  Neurological: Positive for numbness.      Objective:   Physical Exam  Nursing note and vitals reviewed. Constitutional: She is oriented to person, place, and time. She appears well-developed and well-nourished. No distress.  HENT:  Head: Normocephalic and atraumatic.  Right Ear: Tympanic membrane normal.  There is purulent discharge in the left nostril and tenderness in the left maxillary sinus. Throat is clear. No nodes.  Eyes: Conjunctivae and EOM are normal.    Neck: Neck supple.  She ha [place] with neck extension. Radicular symptoms into the right arm with ROM. No sensory or motor losses in the upper extremity. Deep tendon reflexes in the lower extremities are symmetrical without sensory or motor losses.   Cardiovascular: Normal rate.   Pulmonary/Chest: Effort normal. No respiratory distress.  Musculoskeletal: Normal range of motion.  Neurological: She is alert and oriented to person, place, and time.  Skin: Skin is warm and dry.  Psychiatric: She has a normal mood and affect. Her behavior is normal.      BP 152/112  Pulse 87  Temp(Src) 98.1 F (36.7 C) (Oral)  Resp 18  Wt 183 lb (83.008 kg)  SpO2 98%  LMP 06/17/2002  Assessment & Plan:  Essential hypertension - Plan: verapamil (VERELAN PM) 360 MG 24 hr capsule  Cervical disc disorder with radiculopathy of cervical region - Plan: MR Cervical Spine Wo Contrast, traMADol (ULTRAM) 50 MG tablet, oxyCODONE-acetaminophen (PERCOCET) 10-325 MG per tablet  Other acute recurrent sinusitis - Plan: amoxicillin-clavulanate (AUGMENTIN) 875-125 MG per tablet  Allergic rhinitis, cause unspecified  Reactive airway disease, mild intermittent, uncomplicated  Meds ordered this encounter  Medications  . amoxicillin-clavulanate (AUGMENTIN) 875-125 MG per tablet    Sig: Take 1 tablet by mouth 2 (two) times daily.    Dispense:  20 tablet    Refill:  1  . traMADol (ULTRAM) 50 MG tablet    Sig: Take 1-2 tablets (50-100 mg total) by mouth every 6 (six) hours as  needed.    Dispense:  60 tablet    Refill:  2  . oxyCODONE-acetaminophen (PERCOCET) 10-325 MG per tablet    Sig: Take 1 tablet by mouth every 6 (six) hours as needed for pain.    Dispense:  30 tablet    Refill:  0  . verapamil (VERELAN PM) 360 MG 24 hr capsule    Sig: Take 1 capsule (360 mg total) by mouth at bedtime.    Dispense:  90 capsule    Refill:  3     I have completed the patient encounter in its entirety as documented by the  scribe, with editing by me where necessary. Samantha Ragen P. Laney Pastor, M.D.

## 2013-08-11 ENCOUNTER — Ambulatory Visit
Admission: RE | Admit: 2013-08-11 | Discharge: 2013-08-11 | Disposition: A | Discharge status: Home / Self Care | Payer: BC Managed Care – PPO | Source: Ambulatory Visit | Attending: Internal Medicine | Admitting: Internal Medicine

## 2013-08-11 ENCOUNTER — Telehealth: Payer: Self-pay | Admitting: Family Medicine

## 2013-08-11 DIAGNOSIS — M5010 Cervical disc disorder with radiculopathy, unspecified cervical region: Secondary | ICD-10-CM

## 2013-08-11 NOTE — Telephone Encounter (Signed)
Received call report from her MRI. Called to discuss with Katharine Look.  There is some concern of an area of possible mese static disease at T2; called radiology who recommended a bone scan as the next step.  Otherwise her MRI shows mild foraminal narrowing and is generally ok.  Answered all questions, and will ask Dr. Laney Pastor to call when he returns in a few days.

## 2013-08-15 ENCOUNTER — Telehealth: Payer: Self-pay

## 2013-08-15 DIAGNOSIS — B379 Candidiasis, unspecified: Secondary | ICD-10-CM

## 2013-08-15 NOTE — Telephone Encounter (Signed)
Yes--may call in diflucan 150 #2 1 now and 1 in a week

## 2013-08-15 NOTE — Telephone Encounter (Signed)
See mri--will order bone scan and set up eval of cerv radic by ortho

## 2013-08-15 NOTE — Telephone Encounter (Signed)
Patient called back requesting diflucan due to yeast infection in mouth and genitals.  Could we call this in for her?

## 2013-08-15 NOTE — Telephone Encounter (Signed)
lmom for pt to cb

## 2013-08-15 NOTE — Telephone Encounter (Signed)
PT STATES SHE LEFT A MESSAGE FOR DR DOOLITTLE TO GIVE HER A CALL AND WANTED TO MAKE SURE WE HAVE THE CORRECT PHONE NUMBER WHICH IS  [phone] DIDN'T SEE A MESSAGE IN SYSTEM

## 2013-08-15 NOTE — Telephone Encounter (Signed)
Pt awaiting call for MRI results.

## 2013-08-16 ENCOUNTER — Other Ambulatory Visit: Payer: Self-pay | Admitting: Internal Medicine

## 2013-08-16 DIAGNOSIS — C73 Malignant neoplasm of thyroid gland: Secondary | ICD-10-CM

## 2013-08-16 MED ORDER — FLUCONAZOLE 150 MG PO TABS: 150.0000 mg | ORAL_TABLET | Freq: Once | ORAL | Status: DC

## 2013-08-16 NOTE — Progress Notes (Signed)
MRI review=Edema within the right C3-4 facet joint has an appearance most  suggestive of degenerative changes. There is however, a focal area  of altered signal intensity within the T2 spinous process. The  patient has a history of thyroid cancer and therefore metastatic  disease as cause of the T2 spinous process abnormality is a  Possibility  Bone scan Papillary //surgery 2009

## 2013-08-16 NOTE — Telephone Encounter (Signed)
Spoke to pt, she is aware, med sent to pharm as directed.

## 2013-08-17 ENCOUNTER — Other Ambulatory Visit: Payer: Self-pay | Admitting: Radiology

## 2013-08-17 ENCOUNTER — Ambulatory Visit: Payer: BC Managed Care – PPO | Admitting: Internal Medicine

## 2013-08-17 DIAGNOSIS — C73 Malignant neoplasm of thyroid gland: Secondary | ICD-10-CM

## 2013-08-20 ENCOUNTER — Ambulatory Visit (INDEPENDENT_AMBULATORY_CARE_PROVIDER_SITE_OTHER): Payer: BC Managed Care – PPO | Admitting: Internal Medicine

## 2013-08-20 DIAGNOSIS — Z111 Encounter for screening for respiratory tuberculosis: Secondary | ICD-10-CM

## 2013-08-20 NOTE — Progress Notes (Deleted)
   Subjective:    Patient ID: [name], female    DOB: 1960-12-07, 53 y.o.   MRN: [id number]  HPI    Review of Systems     Objective:   Physical Exam        Assessment & Plan:

## 2013-08-20 NOTE — Progress Notes (Deleted)
  Tuberculosis Risk Questionnaire  1. {EXAM; YES/NO:19492::"No"} Were you born outside the [place] in one of the following parts of the world: [place], [place], [place], [place] or [place]?    2. {EXAM; YES/NO:19492::"No"} Have you traveled outside the [place] and lived for more than one month in one of the following parts of the world: [place], [place], [place], [place] or [place]?    3. {EXAM; YES/NO:19492::"No"} Do you have a compromised immune system such as from any of the following conditions:HIV/AIDS, organ or bone marrow transplantation, diabetes, immunosuppressive medicines (e.g. Prednisone, Remicaide), leukemia, lymphoma, cancer of the head or neck, gastrectomy or jejunal bypass, end-stage renal disease (on dialysis), or silicosis?     4. {EXAM; YES/NO:19492::"No"} Have you ever or do you plan on working in: a residential care center, a health care facility, a jail or prison or homeless shelter?    5. {EXAM; YES/NO:19492::"No"} Have you ever: injected illegal drugs, used crack cocaine, lived in a homeless shelter  or been in jail or prison?     6. {EXAM; YES/NO:19492::"No"} Have you ever been exposed to anyone with infectious tuberculosis?    Tuberculosis Symptom Questionnaire  Do you currently have any of the following symptoms?  1. {EXAM; YES/NO:19492::"No"} Unexplained cough lasting more than 3 weeks?   2. {EXAM; YES/NO:19492::"No"} Unexplained fever lasting more than 3 weeks.   3. {EXAM; YES/NO:19492::"No"} Night Sweats (sweating that leaves the bedclothes and sheets wet)     4. {EXAM; YES/NO:19492::"No"} Shortness of Breath   5. {EXAM; YES/NO:19492::"No"} Chest Pain   6. {EXAM; YES/NO:19492::"No"} Unintentional weight loss    7. {EXAM; YES/NO:19492::"No"} Unexplained fatigue (very tired for no reason)

## 2013-08-20 NOTE — Progress Notes (Signed)
   Subjective:    Patient ID: [name], female    DOB: [birth year], 53 y.o.   MRN: [id number]  HPI    Review of Systems     Objective:   Physical Exam        Assessment & Plan:   Tuberculosis Risk Questionnaire  1. No Were you born outside the [place] in one of the following parts of the world: [place], [place], [place], [place] or [place]?    2. No Have you traveled outside the [place] and lived for more than one month in one of the following parts of the world: [place], [place], [place], [place] or [place]?    3. No Do you have a compromised immune system such as from any of the following conditions:HIV/AIDS, organ or bone marrow transplantation, diabetes, immunosuppressive medicines (e.g. Prednisone, Remicaide), leukemia, lymphoma, cancer of the head or neck, gastrectomy or jejunal bypass, end-stage renal disease (on dialysis), or silicosis?     4. No Have you ever or do you plan on working in: a residential care center, a health care facility, a jail or prison or homeless shelter?    5. No Have you ever: injected illegal drugs, used crack cocaine, lived in a homeless shelter  or been in jail or prison?     6. No Have you ever been exposed to anyone with infectious tuberculosis?    Tuberculosis Symptom Questionnaire  Do you currently have any of the following symptoms?  1. No Unexplained cough lasting more than 3 weeks?   2. No Unexplained fever lasting more than 3 weeks.   3. No Night Sweats (sweating that leaves the bedclothes and sheets wet)     4. No Shortness of Breath   5. No Chest Pain   6. No Unintentional weight loss    7. No  Unexplained fatigue (very tired for no reason)

## 2013-08-20 NOTE — Progress Notes (Signed)
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

## 2013-08-22 ENCOUNTER — Ambulatory Visit (INDEPENDENT_AMBULATORY_CARE_PROVIDER_SITE_OTHER): Payer: BC Managed Care – PPO | Admitting: *Deleted

## 2013-08-22 DIAGNOSIS — Z111 Encounter for screening for respiratory tuberculosis: Secondary | ICD-10-CM

## 2013-08-22 DIAGNOSIS — Z09 Encounter for follow-up examination after completed treatment for conditions other than malignant neoplasm: Secondary | ICD-10-CM

## 2013-08-22 LAB — TB SKIN TEST
Induration: 0 mm
TB Skin Test: NEGATIVE

## 2013-08-24 ENCOUNTER — Telehealth: Payer: Self-pay

## 2013-08-24 NOTE — Telephone Encounter (Signed)
Called her Sept 11th is the soonest she can have the bone scan done. I encouraged her to go sooner, but she is between insurance companies. I expressed to her I am very concerned about her waiting this long for the scan, she said to make sure you know what is going on and why she has not gone yet. Amy

## 2013-08-24 NOTE — Telephone Encounter (Signed)
Pt did not want to discuss this with me. She wants Amy to call her back in regards to her bone scan.

## 2013-08-24 NOTE — Telephone Encounter (Signed)
Patient states the nurse will know why she is calling - bone scan.  [phone]

## 2013-08-30 ENCOUNTER — Ambulatory Visit (HOSPITAL_COMMUNITY): Payer: BC Managed Care – PPO

## 2013-08-30 ENCOUNTER — Encounter (HOSPITAL_COMMUNITY): Payer: BC Managed Care – PPO

## 2013-09-04 ENCOUNTER — Other Ambulatory Visit: Payer: Self-pay | Admitting: Physician Assistant

## 2013-09-05 NOTE — Telephone Encounter (Signed)
Dr Laney Pastor, we put note on last RF that she needed OV for more. She came in to see you for acute issue soon thereafter but did not discuss this med. You wanted to see her back in 3 mos, do you want to give RFs to cover until then, or see her back for RF now?

## 2013-09-05 NOTE — Telephone Encounter (Signed)
Meds ordered this encounter  Medications  . venlafaxine XR (EFFEXOR-XR) 150 MG 24 hr capsule    Sig: Take 1 capsule (150 mg total) by mouth daily with breakfast.    Dispense:  30 capsule    Refill:  2

## 2013-09-08 ENCOUNTER — Other Ambulatory Visit: Payer: Self-pay | Admitting: Internal Medicine

## 2013-09-17 ENCOUNTER — Other Ambulatory Visit: Payer: Self-pay | Admitting: Internal Medicine

## 2013-09-20 NOTE — Telephone Encounter (Signed)
Dr Laney Pastor, you saw pt for check up in July, but don't see thyroid addressed at that time. Last TSH in 03/2013. OK to give RFs?

## 2013-09-23 ENCOUNTER — Telehealth: Payer: Self-pay

## 2013-09-23 DIAGNOSIS — M5010 Cervical disc disorder with radiculopathy, unspecified cervical region: Secondary | ICD-10-CM

## 2013-09-23 NOTE — Telephone Encounter (Signed)
Medication needed refilled from [facility] oxyCODONE-acetaminophen (PERCOCET) 10-325 MG per tablet  BEST: [phone]

## 2013-09-24 MED ORDER — OXYCODONE-ACETAMINOPHEN 10-325 MG PO TABS: 1.0000 | ORAL_TABLET | Freq: Four times a day (QID) | ORAL | Status: DC | PRN

## 2013-09-24 NOTE — Telephone Encounter (Signed)
Meds ordered this encounter  Medications  . oxyCODONE-acetaminophen (PERCOCET) 10-325 MG per tablet    Sig: Take 1 tablet by mouth every 6 (six) hours as needed for pain.    Dispense:  30 tablet    Refill:  0

## 2013-09-25 ENCOUNTER — Other Ambulatory Visit: Payer: Self-pay

## 2013-09-25 ENCOUNTER — Telehealth: Payer: Self-pay

## 2013-09-25 DIAGNOSIS — M5010 Cervical disc disorder with radiculopathy, unspecified cervical region: Secondary | ICD-10-CM

## 2013-09-25 MED ORDER — OXYCODONE-ACETAMINOPHEN 10-325 MG PO TABS: 1.0000 | ORAL_TABLET | Freq: Four times a day (QID) | ORAL | Status: DC | PRN

## 2013-09-25 NOTE — Telephone Encounter (Signed)
Called in rx. Lmom informing rx is ready for pick up.

## 2013-09-30 ENCOUNTER — Encounter (HOSPITAL_COMMUNITY): Payer: BC Managed Care – PPO

## 2013-09-30 ENCOUNTER — Encounter (HOSPITAL_COMMUNITY)
Admission: RE | Admit: 2013-09-30 | Discharge: 2013-09-30 | Disposition: A | Discharge status: Home / Self Care | Payer: BC Managed Care – PPO | Source: Ambulatory Visit | Attending: Internal Medicine | Admitting: Internal Medicine

## 2013-09-30 ENCOUNTER — Telehealth: Payer: Self-pay

## 2013-09-30 DIAGNOSIS — R937 Abnormal findings on diagnostic imaging of other parts of musculoskeletal system: Secondary | ICD-10-CM

## 2013-09-30 DIAGNOSIS — C73 Malignant neoplasm of thyroid gland: Secondary | ICD-10-CM | POA: Insufficient documentation

## 2013-09-30 MED ORDER — TECHNETIUM TC 99M MEDRONATE IV KIT
26.8000 | PACK | Freq: Once | INTRAVENOUS | Status: AC | PRN
  Administered 2013-09-30: 26.8 via INTRAVENOUS

## 2013-09-30 NOTE — Telephone Encounter (Signed)
Pt would like to leave a message for Dr. Lorelei Pont regarding her recent bone scan. Pt would like to discuss this with Dr. Lorelei Pont.

## 2013-10-01 NOTE — Telephone Encounter (Signed)
Called and discussed her bone scan with her .  To my read it looks good.  However we will have her see oncology to ensure we are done with this issue as her MRI was concerning.

## 2013-10-03 ENCOUNTER — Telehealth: Payer: Self-pay

## 2013-10-03 DIAGNOSIS — M5010 Cervical disc disorder with radiculopathy, unspecified cervical region: Secondary | ICD-10-CM

## 2013-10-03 NOTE — Telephone Encounter (Signed)
Pt would like to discuss the results of the bone scan she had done at [place] 09/30/13 with dr. Laney Pastor.

## 2013-10-04 NOTE — Telephone Encounter (Signed)
Opened in error

## 2013-10-05 NOTE — Telephone Encounter (Signed)
Pt is needing to get results of bone density  She thought that dr Laney Pastor would call by now since first message for Monday

## 2013-10-06 NOTE — Telephone Encounter (Signed)
Dr. Laney Pastor,  Please advise what you would like [place] to tell the patient about her bone density scan. Thank you!

## 2013-10-07 ENCOUNTER — Telehealth: Payer: Self-pay | Admitting: Oncology

## 2013-10-07 NOTE — Telephone Encounter (Signed)
LEFT MESSAGE FOR PATIENT AND GAVE NP APPT FOR 09/28 @ 1:30 W.DR. Marko Plume. CONTACT INFORMATION WAS LEFT FOR PATIENT TO RETURN CALL TO CONFIRM MESSAGE WAS RECEIVED AND APPT.

## 2013-10-08 DIAGNOSIS — M5010 Cervical disc disorder with radiculopathy, unspecified cervical region: Secondary | ICD-10-CM | POA: Insufficient documentation

## 2013-10-08 NOTE — Telephone Encounter (Signed)
This investigation into her cervical and thoracic areas started because of significant disability from neck pain with radicular symptoms. The MRI suggested nothing acute that needs surgery but raise questions about metastatic thyroid disease at T2. The bone scan was reassuring without metastatic lesions being identified definitely. She will followup with Dr. Sheralyn Boatman to further consider the  the uptake seen on the bone scan. I will refer her to Dr. Lynann Bologna for evaluation of her neck

## 2013-10-17 ENCOUNTER — Ambulatory Visit: Payer: BC Managed Care – PPO

## 2013-10-17 ENCOUNTER — Ambulatory Visit: Payer: BC Managed Care – PPO | Admitting: Hematology and Oncology

## 2013-10-31 ENCOUNTER — Other Ambulatory Visit: Payer: Self-pay | Admitting: Internal Medicine

## 2013-11-02 ENCOUNTER — Other Ambulatory Visit: Payer: Self-pay | Admitting: Internal Medicine

## 2013-11-02 ENCOUNTER — Ambulatory Visit (INDEPENDENT_AMBULATORY_CARE_PROVIDER_SITE_OTHER): Payer: BC Managed Care – PPO | Admitting: Family Medicine

## 2013-11-02 VITALS — BP 130/72 | HR 84 | Temp 98.1°F | Resp 18 | Ht 65.5 in | Wt 183.0 lb

## 2013-11-02 DIAGNOSIS — H6591 Unspecified nonsuppurative otitis media, right ear: Secondary | ICD-10-CM

## 2013-11-02 DIAGNOSIS — J0101 Acute recurrent maxillary sinusitis: Secondary | ICD-10-CM

## 2013-11-02 MED ORDER — LEVOFLOXACIN 500 MG PO TABS: 500.0000 mg | ORAL_TABLET | Freq: Every day | ORAL | Status: DC

## 2013-11-02 NOTE — Patient Instructions (Signed)
levaquin as discussed, saline nasal spray, fluids.tylenol,  advil or alleve if needed.   Return to the clinic or go to the nearest emergency room if any of your symptoms worsen or new symptoms occur.  Sinusitis Sinusitis is redness, soreness, and inflammation of the paranasal sinuses. Paranasal sinuses are air pockets within the bones of your face (beneath the eyes, the middle of the forehead, or above the eyes). In healthy paranasal sinuses, mucus is able to drain out, and air is able to circulate through them by way of your nose. However, when your paranasal sinuses are inflamed, mucus and air can become trapped. This can allow bacteria and other germs to grow and cause infection. Sinusitis can develop quickly and last only a short time (acute) or continue over a long period (chronic). Sinusitis that lasts for more than 12 weeks is considered chronic.  CAUSES  Causes of sinusitis include:  Allergies.  Structural abnormalities, such as displacement of the cartilage that separates your nostrils (deviated septum), which can decrease the air flow through your nose and sinuses and affect sinus drainage.  Functional abnormalities, such as when the small hairs (cilia) that line your sinuses and help remove mucus do not work properly or are not present. SIGNS AND SYMPTOMS  Symptoms of acute and chronic sinusitis are the same. The primary symptoms are pain and pressure around the affected sinuses. Other symptoms include:  Upper toothache.  Earache.  Headache.  Bad breath.  Decreased sense of smell and taste.  A cough, which worsens when you are lying flat.  Fatigue.  Fever.  Thick drainage from your nose, which often is green and may contain pus (purulent).  Swelling and warmth over the affected sinuses. DIAGNOSIS  Your health care provider will perform a physical exam. During the exam, your health care provider may:  Look in your nose for signs of abnormal growths in your nostrils  (nasal polyps).  Tap over the affected sinus to check for signs of infection.  View the inside of your sinuses (endoscopy) using an imaging device that has a light attached (endoscope). If your health care provider suspects that you have chronic sinusitis, one or more of the following tests may be recommended:  Allergy tests.  Nasal culture. A sample of mucus is taken from your nose, sent to a lab, and screened for bacteria.  Nasal cytology. A sample of mucus is taken from your nose and examined by your health care provider to determine if your sinusitis is related to an allergy. TREATMENT  Most cases of acute sinusitis are related to a viral infection and will resolve on their own within 10 days. Sometimes medicines are prescribed to help relieve symptoms (pain medicine, decongestants, nasal steroid sprays, or saline sprays).  However, for sinusitis related to a bacterial infection, your health care provider will prescribe antibiotic medicines. These are medicines that will help kill the bacteria causing the infection.  Rarely, sinusitis is caused by a fungal infection. In theses cases, your health care provider will prescribe antifungal medicine. For some cases of chronic sinusitis, surgery is needed. Generally, these are cases in which sinusitis recurs more than 3 times per year, despite other treatments. HOME CARE INSTRUCTIONS   Drink plenty of water. Water helps thin the mucus so your sinuses can drain more easily.  Use a humidifier.  Inhale steam 3 to 4 times a day (for example, sit in the bathroom with the shower running).  Apply a warm, moist washcloth to your face 3  to 4 times a day, or as directed by your health care provider.  Use saline nasal sprays to help moisten and clean your sinuses.  Take medicines only as directed by your health care provider.  If you were prescribed either an antibiotic or antifungal medicine, finish it all even if you start to feel better. SEEK  IMMEDIATE MEDICAL CARE IF:  You have increasing pain or severe headaches.  You have nausea, vomiting, or drowsiness.  You have swelling around your face.  You have vision problems.  You have a stiff neck.  You have difficulty breathing. MAKE SURE YOU:   Understand these instructions.  Will watch your condition.  Will get help right away if you are not doing well or get worse. Document Released: 01/06/2005 Document Revised: 05/23/2013 Document Reviewed: 01/21/2011 [facility] Patient Information 2015 [place], [place]. This information is not intended to replace advice given to you by your health care provider. Make sure you discuss any questions you have with your health care provider.  Otitis Media Otitis media is redness, soreness, and inflammation of the middle ear. Otitis media may be caused by allergies or, most commonly, by infection. Often it occurs as a complication of the common cold. SIGNS AND SYMPTOMS Symptoms of otitis media may include:  Earache.  Fever.  Ringing in your ear.  Headache.  Leakage of fluid from the ear. DIAGNOSIS To diagnose otitis media, your health care provider will examine your ear with an otoscope. This is an instrument that allows your health care provider to see into your ear in order to examine your eardrum. Your health care provider also will ask you questions about your symptoms. TREATMENT  Typically, otitis media resolves on its own within 3-5 days. Your health care provider may prescribe medicine to ease your symptoms of pain. If otitis media does not resolve within 5 days or is recurrent, your health care provider may prescribe antibiotic medicines if he or she suspects that a bacterial infection is the cause. HOME CARE INSTRUCTIONS   If you were prescribed an antibiotic medicine, finish it all even if you start to feel better.  Take medicines only as directed by your health care provider.  Keep all follow-up visits as directed by  your health care provider. SEEK MEDICAL CARE IF:  You have otitis media only in one ear, or bleeding from your nose, or both.  You notice a lump on your neck.  You are not getting better in 3-5 days.  You feel worse instead of better. SEEK IMMEDIATE MEDICAL CARE IF:   You have pain that is not controlled with medicine.  You have swelling, redness, or pain around your ear or stiffness in your neck.  You notice that part of your face is paralyzed.  You notice that the bone behind your ear (mastoid) is tender when you touch it. MAKE SURE YOU:   Understand these instructions.  Will watch your condition.  Will get help right away if you are not doing well or get worse. Document Released: 10/12/2003 Document Revised: 05/23/2013 Document Reviewed: 08/03/2012 [facility] Patient Information 2015 [place], [place]. This information is not intended to replace advice given to you by your health care provider. Make sure you discuss any questions you have with your health care provider.

## 2013-11-02 NOTE — Progress Notes (Signed)
Subjective:   This chart was scribed for Stacey Ray, MD by Forrestine Him, [facility] Scribe. This patient was seen in room 4 and the patient's care was started 7:25 PM.    Patient ID: [name], female    DOB: October 12, 1960, 53 y.o.   MRN: [id number]  Chief Complaint  Patient presents with  . sinus pressure    mucinex no relief x2 weeks   . Nasal Congestion  . Fever    x2 days low grade      HPI  HPI Comments: [name] is a 53 y.o. female with a PMHx of HTN, asthma, hypothyroid, and thyroid cancer who presents to the Emergency Department complaining of constant, moderate maxillary sinus pressure x 2 weeks that is unchanged at this time. Pt also reports associated nasal congestion, cough, R sided tooth pain, R sided otalgia, and rhinorrhea. She mentions fever onset 2 days brought on at night time. Fever has been 101 at its highest. She has tried OTC Mucinex, saline nasal sprays, Netipot, Ibuprofen, and a lot of water consumption without any improvement for symptoms. She denies any chest congestion or chest pain. 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  Patient Active Problem List   Diagnosis Date Noted  . Cervical disc disorder with radiculopathy of cervical region 10/08/2013  . 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 Menopause 06/17/2011  . Allergic rhinitis, cause unspecified 05/13/2011  . Reactive airway disease 05/13/2011  . Depressed 05/13/2011  . Migraine 05/13/2011  . Thyroid cancer 05/13/2011  . Hypothyroidism, postsurgical 05/13/2011  . Hypertension 12/25/2010   Past Medical History  Diagnosis Date  . HTN (hypertension)   . Herpes   . Depression   . Asthma   . Deviated septum   . Insomnia   . Hypothyroid   . Thyroid cancer 2009  . Allergy   . Neuromuscular disorder    Past Surgical History  Procedure Laterality Date  . Cesarean  section    . Thyroidectomy      PARATHYROIDECTOMY  . Dilatation and curettage    . Cholecystectomy     Allergies  Allergen Reactions  . Codeine   . Lisinopril    Prior to Admission medications   Medication Sig Start Date End Date Taking? Authorizing Provider  acyclovir (ZOVIRAX) 200 MG capsule TAKE 2 CAPSULES BY MOUTH 2 TIMES DAILY. 02/25/13  Yes Leandrew Koyanagi, MD  amoxicillin-clavulanate (AUGMENTIN) 875-125 MG per tablet Take 1 tablet by mouth 2 (two) times daily. 08/02/13  Yes Leandrew Koyanagi, MD  calcitRIOL (ROCALTROL) 0.25 MCG capsule Take 0.25 mcg by mouth. 04/26/13  Yes Historical Provider, MD  clonazePAM (KLONOPIN) 1 MG tablet TAKE 1 TABLET BY MOUTH TWICE A DAY AS NEEDED 08/12/12  Yes Leandrew Koyanagi, MD  clotrimazole-betamethasone (LOTRISONE) cream Apply topically 2 (two) times daily. 08/16/12  Yes Leandrew Koyanagi, MD  cyclobenzaprine (FLEXERIL) 10 MG tablet TAKE 1 TABLET (10 MG TOTAL) BY MOUTH 3 (THREE) TIMES DAILY AS NEEDED FOR MUSCLE SPASMS. 05/22/13  Yes Leandrew Koyanagi, MD  estradiol (ESTRACE) 1 MG tablet Take 1 tablet (1 mg total) by mouth daily. 05/27/13  Yes Terrance Mass, MD  levothyroxine (SYNTHROID, LEVOTHROID) 175 MCG tablet TAKE 1 TABLET (175 MCG TOTAL) BY MOUTH DAILY. 09/21/13  Yes Leandrew Koyanagi, MD  metroNIDAZOLE (METROGEL) 0.75 % vaginal gel Place 1 Applicatorful vaginally 2 (two) times daily. Leave  on file and may request monthly if needed for the next 5-6 months 08/16/12  Yes Leandrew Koyanagi, MD  montelukast (SINGULAIR) 10 MG tablet TAKE 1 TABLET (10 MG TOTAL) BY MOUTH AT BEDTIME. 12/06/12  Yes Eleanore E Elana Alm, PA-C  oxyCODONE-acetaminophen (PERCOCET) 10-325 MG per tablet Take 1 tablet by mouth every 6 (six) hours as needed for pain. 09/25/13  Yes Leandrew Koyanagi, MD  progesterone (PROMETRIUM) 200 MG capsule Take one tablet daily for 12 days of the month 05/27/13  Yes Terrance Mass, MD  QVAR 40 MCG/ACT inhaler INHALE 1 PUFF INTO THE LUNGS 2 (TWO)  TIMES DAILY. 11/01/13  Yes Leandrew Koyanagi, MD  rizatriptan (MAXALT) 10 MG tablet TAKE 1 TABLET BY MOUTH AS NEEDED FOR MIGRAINE. MAY REPEAT IN 2 HOURS IF NEEDED 04/06/13  Yes Leandrew Koyanagi, MD  traMADol (ULTRAM) 50 MG tablet Take 1-2 tablets (50-100 mg total) by mouth every 6 (six) hours as needed. 08/02/13  Yes Leandrew Koyanagi, MD  triamterene-hydrochlorothiazide (DYAZIDE) 37.5-25 MG per capsule Take 1 each (1 capsule total) by mouth daily. 07/03/13  Yes Leandrew Koyanagi, MD  venlafaxine XR (EFFEXOR-XR) 150 MG 24 hr capsule Take 1 capsule (150 mg total) by mouth daily with breakfast. 09/05/13  Yes Leandrew Koyanagi, MD  VENTOLIN HFA 108 (90 BASE) MCG/ACT inhaler INHALE 2 PUFFS INTO THE LUNGS EVERY 4 (FOUR) HOURS AS NEEDED FOR WHEEZING. 11/01/13  Yes Leandrew Koyanagi, MD  verapamil (VERELAN PM) 360 MG 24 hr capsule Take 1 capsule (360 mg total) by mouth at bedtime. 08/02/13  Yes Leandrew Koyanagi, MD  fluconazole (DIFLUCAN) 150 MG tablet Take 1 tablet (150 mg total) by mouth once. and 1 tablet 1 week later 08/16/13   Leandrew Koyanagi, MD   History   Social History  . Marital Status: Married    Spouse Name: N/A    Number of Children: 1  . Years of Education: N/A   Occupational History  .      Teach elementary school   Social History Main Topics  . Smoking status: Former Smoker -- 2.00 packs/day    Quit date: 05/13/1990  . Smokeless tobacco: Never Used  . Alcohol Use: No     Comment: Rarely  . Drug Use: No  . Sexual Activity: Yes   Other Topics Concern  . Not on file   Social History Narrative    She is a Radio producer.  Has an 73-year-old child.  Does    not smoke cigarettes or drink alcohol.  She is married.           Review of Systems  Constitutional: Positive for fever. Negative for chills.  HENT: Positive for congestion, dental problem, ear pain, rhinorrhea and sinus pressure.   Respiratory: Positive for cough.   Cardiovascular: Negative for chest pain.    Gastrointestinal: Negative for nausea and vomiting.     Objective:  Physical Exam  Vitals reviewed. Constitutional: She is oriented to person, place, and time. She appears well-developed and well-nourished. No distress.  HENT:  Head: Normocephalic and atraumatic.  Right Ear: Hearing, external ear and ear canal normal. No mastoid tenderness. Tympanic membrane is erythematous. Tympanic membrane is not retracted.  Left Ear: Hearing, tympanic membrane, external ear and ear canal normal.  Nose: Right sinus exhibits maxillary sinus tenderness. Right sinus exhibits no frontal sinus tenderness. Left sinus exhibits maxillary sinus tenderness. Left sinus exhibits no frontal sinus tenderness.  Mouth/Throat: Oropharynx is clear and moist. No  oropharyngeal exudate.  R TM is dull and erythematous with small amount of fliud noted behind TM L greater than R maxillary tenderness  Eyes: Conjunctivae and EOM are normal. Pupils are equal, round, and reactive to light.  Cardiovascular: Normal rate, regular rhythm, normal heart sounds and intact distal pulses.   No murmur heard. Pulmonary/Chest: Effort normal and breath sounds normal. No respiratory distress. She has no wheezes. She has no rhonchi. She has no rales.  Lymphadenopathy:    She has no cervical adenopathy.  Neurological: She is alert and oriented to person, place, and time.  Skin: Skin is warm and dry. No rash noted.  Psychiatric: She has a normal mood and affect. Her behavior is normal.       Filed Vitals:   11/02/13 1841  BP: 130/72  Pulse: 84  Temp: 98.1 F (36.7 C)  TempSrc: Oral  Resp: 18  Height: 5' 5.5" (1.664 m)  Weight: 183 lb (83.008 kg)  SpO2: 97%    Assessment & Plan:   HYACINTH MARCELLI is a 53 y.o. female Acute recurrent maxillary sinusitis - Plan: levofloxacin (LEVAQUIN) 500 MG tablet  Right otitis media with effusion - Plan: levofloxacin (LEVAQUIN) 500 MG tablet  Maxillary sinusitis with early R AOM. Hx of  multiple augmentin doses with prior sinusitis.  Options discussed, chose levaquin - will treat for 10 days. tendonopathy  risks discussed. Sx care discussed in AVS, and RTC precautions given.    Meds ordered this encounter  Medications  . levofloxacin (LEVAQUIN) 500 MG tablet    Sig: Take 1 tablet (500 mg total) by mouth daily.    Dispense:  10 tablet    Refill:  0   Patient Instructions  levaquin as discussed, saline nasal spray, fluids.tylenol,  advil or alleve if needed.   Return to the clinic or go to the nearest emergency room if any of your symptoms worsen or new symptoms occur.  Sinusitis Sinusitis is redness, soreness, and inflammation of the paranasal sinuses. Paranasal sinuses are air pockets within the bones of your face (beneath the eyes, the middle of the forehead, or above the eyes). In healthy paranasal sinuses, mucus is able to drain out, and air is able to circulate through them by way of your nose. However, when your paranasal sinuses are inflamed, mucus and air can become trapped. This can allow bacteria and other germs to grow and cause infection. Sinusitis can develop quickly and last only a short time (acute) or continue over a long period (chronic). Sinusitis that lasts for more than 12 weeks is considered chronic.  CAUSES  Causes of sinusitis include:  Allergies.  Structural abnormalities, such as displacement of the cartilage that separates your nostrils (deviated septum), which can decrease the air flow through your nose and sinuses and affect sinus drainage.  Functional abnormalities, such as when the small hairs (cilia) that line your sinuses and help remove mucus do not work properly or are not present. SIGNS AND SYMPTOMS  Symptoms of acute and chronic sinusitis are the same. The primary symptoms are pain and pressure around the affected sinuses. Other symptoms include:  Upper toothache.  Earache.  Headache.  Bad breath.  Decreased sense of smell and  taste.  A cough, which worsens when you are lying flat.  Fatigue.  Fever.  Thick drainage from your nose, which often is green and may contain pus (purulent).  Swelling and warmth over the affected sinuses. DIAGNOSIS  Your health care provider will perform a physical  exam. During the exam, your health care provider may:  Look in your nose for signs of abnormal growths in your nostrils (nasal polyps).  Tap over the affected sinus to check for signs of infection.  View the inside of your sinuses (endoscopy) using an imaging device that has a light attached (endoscope). If your health care provider suspects that you have chronic sinusitis, one or more of the following tests may be recommended:  Allergy tests.  Nasal culture. A sample of mucus is taken from your nose, sent to a lab, and screened for bacteria.  Nasal cytology. A sample of mucus is taken from your nose and examined by your health care provider to determine if your sinusitis is related to an allergy. TREATMENT  Most cases of acute sinusitis are related to a viral infection and will resolve on their own within 10 days. Sometimes medicines are prescribed to help relieve symptoms (pain medicine, decongestants, nasal steroid sprays, or saline sprays).  However, for sinusitis related to a bacterial infection, your health care provider will prescribe antibiotic medicines. These are medicines that will help kill the bacteria causing the infection.  Rarely, sinusitis is caused by a fungal infection. In theses cases, your health care provider will prescribe antifungal medicine. For some cases of chronic sinusitis, surgery is needed. Generally, these are cases in which sinusitis recurs more than 3 times per year, despite other treatments. HOME CARE INSTRUCTIONS   Drink plenty of water. Water helps thin the mucus so your sinuses can drain more easily.  Use a humidifier.  Inhale steam 3 to 4 times a day (for example, sit in the  bathroom with the shower running).  Apply a warm, moist washcloth to your face 3 to 4 times a day, or as directed by your health care provider.  Use saline nasal sprays to help moisten and clean your sinuses.  Take medicines only as directed by your health care provider.  If you were prescribed either an antibiotic or antifungal medicine, finish it all even if you start to feel better. SEEK IMMEDIATE MEDICAL CARE IF:  You have increasing pain or severe headaches.  You have nausea, vomiting, or drowsiness.  You have swelling around your face.  You have vision problems.  You have a stiff neck.  You have difficulty breathing. MAKE SURE YOU:   Understand these instructions.  Will watch your condition.  Will get help right away if you are not doing well or get worse. Document Released: 01/06/2005 Document Revised: 05/23/2013 Document Reviewed: 01/21/2011 [facility] Patient Information 2015 [place], [place]. This information is not intended to replace advice given to you by your health care provider. Make sure you discuss any questions you have with your health care provider.  Otitis Media Otitis media is redness, soreness, and inflammation of the middle ear. Otitis media may be caused by allergies or, most commonly, by infection. Often it occurs as a complication of the common cold. SIGNS AND SYMPTOMS Symptoms of otitis media may include:  Earache.  Fever.  Ringing in your ear.  Headache.  Leakage of fluid from the ear. DIAGNOSIS To diagnose otitis media, your health care provider will examine your ear with an otoscope. This is an instrument that allows your health care provider to see into your ear in order to examine your eardrum. Your health care provider also will ask you questions about your symptoms. TREATMENT  Typically, otitis media resolves on its own within 3-5 days. Your health care provider may prescribe medicine to  ease your symptoms of pain. If otitis media does  not resolve within 5 days or is recurrent, your health care provider may prescribe antibiotic medicines if he or she suspects that a bacterial infection is the cause. HOME CARE INSTRUCTIONS   If you were prescribed an antibiotic medicine, finish it all even if you start to feel better.  Take medicines only as directed by your health care provider.  Keep all follow-up visits as directed by your health care provider. SEEK MEDICAL CARE IF:  You have otitis media only in one ear, or bleeding from your nose, or both.  You notice a lump on your neck.  You are not getting better in 3-5 days.  You feel worse instead of better. SEEK IMMEDIATE MEDICAL CARE IF:   You have pain that is not controlled with medicine.  You have swelling, redness, or pain around your ear or stiffness in your neck.  You notice that part of your face is paralyzed.  You notice that the bone behind your ear (mastoid) is tender when you touch it. MAKE SURE YOU:   Understand these instructions.  Will watch your condition.  Will get help right away if you are not doing well or get worse. Document Released: 10/12/2003 Document Revised: 05/23/2013 Document Reviewed: 08/03/2012 [facility] Patient Information 2015 [place], [place]. This information is not intended to replace advice given to you by your health care provider. Make sure you discuss any questions you have with your health care provider.    I personally performed the services described in this documentation, which was scribed in my presence. The recorded information has been reviewed and considered, and addended by me as needed.  ]

## 2013-11-04 NOTE — Telephone Encounter (Signed)
?  78month rx---

## 2013-11-04 NOTE — Telephone Encounter (Signed)
Called in.

## 2013-11-05 ENCOUNTER — Other Ambulatory Visit: Payer: Self-pay | Admitting: Internal Medicine

## 2013-11-20 ENCOUNTER — Telehealth: Payer: Self-pay

## 2013-11-20 DIAGNOSIS — M5010 Cervical disc disorder with radiculopathy, unspecified cervical region: Secondary | ICD-10-CM

## 2013-11-20 NOTE — Telephone Encounter (Signed)
Patient requesting refills on "tramadol" and "Percocet". Please call patient when ready to be picked up at [phone]

## 2013-11-21 MED ORDER — TRAMADOL HCL 50 MG PO TABS: 50.0000 mg | ORAL_TABLET | Freq: Four times a day (QID) | ORAL | Status: DC | PRN

## 2013-11-21 MED ORDER — OXYCODONE-ACETAMINOPHEN 10-325 MG PO TABS: 1.0000 | ORAL_TABLET | Freq: Four times a day (QID) | ORAL | Status: DC | PRN

## 2013-11-21 NOTE — Telephone Encounter (Signed)
Lm to advise pt. Rx up front.

## 2013-12-08 ENCOUNTER — Other Ambulatory Visit: Payer: Self-pay | Admitting: Internal Medicine

## 2013-12-08 NOTE — Telephone Encounter (Signed)
Dr Laney Pastor, you saw pt in July, but I don't see this med discussed recently. Do you want to OK RFs or have pt RTC?

## 2013-12-10 ENCOUNTER — Ambulatory Visit (INDEPENDENT_AMBULATORY_CARE_PROVIDER_SITE_OTHER): Payer: BC Managed Care – PPO | Admitting: Family Medicine

## 2013-12-10 VITALS — BP 156/100 | HR 100 | Temp 98.0°F | Resp 16 | Ht 64.5 in | Wt 178.1 lb

## 2013-12-10 DIAGNOSIS — J029 Acute pharyngitis, unspecified: Secondary | ICD-10-CM

## 2013-12-10 MED ORDER — AMOXICILLIN 875 MG PO TABS: 875.0000 mg | ORAL_TABLET | Freq: Two times a day (BID) | ORAL | Status: DC

## 2013-12-10 NOTE — Progress Notes (Signed)
Subjective:    Patient ID: [name], female    DOB: 12/28/1960, 53 y.o.   MRN: [id number] This chart was scribed for Dr. Robyn Haber by Steva Colder, ED Scribe. The patient was seen in room 10 at 11:56 AM.   Chief Complaint  Patient presents with  . Sore Throat  . Rash    on chest    HPI [name] is a 53 y.o. female with a medical hx of HTN who presents today complaining of sore throat onset today. She reports that the sore throat began this morning and it is on one side. 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 She states that she is having associated symptoms of rash on chest, cough, and right ear pain. She denies any other associated symptoms. She states that she had Thyroid CA in 2009 and she takes medications for that.  She states that she is allergic to Linsinopril and it gives her a cough.    Patient Active Problem List   Diagnosis Date Noted  . Cervical disc disorder with radiculopathy of cervical region 10/08/2013  . 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 Menopause 06/17/2011  . Allergic rhinitis, cause unspecified 05/13/2011  . Reactive airway disease 05/13/2011  . Depressed 05/13/2011  . Migraine 05/13/2011  . Thyroid cancer 05/13/2011  . Hypothyroidism, postsurgical 05/13/2011  . Hypertension 12/25/2010   Past Medical History  Diagnosis Date  . HTN (hypertension)   . Herpes   . Depression   . Asthma   . Deviated septum   . Insomnia   . Hypothyroid   . Thyroid cancer 2009  . Allergy   . Neuromuscular disorder    Past Surgical History  Procedure Laterality Date  . Cesarean section    . Thyroidectomy      PARATHYROIDECTOMY  . Dilatation and curettage    . Cholecystectomy     Allergies  Allergen Reactions  . Codeine   . Lisinopril    Prior to Admission  medications   Medication Sig Start Date End Date Taking? Authorizing Provider  acyclovir (ZOVIRAX) 200 MG capsule TAKE 2 CAPSULES BY MOUTH 2 TIMES DAILY. 02/25/13  Yes Leandrew Koyanagi, MD  calcitRIOL (ROCALTROL) 0.25 MCG capsule TAKE 2 CAPSULES (0.5 MCG TOTAL) BY MOUTH DAILY. 11/08/13  Yes Wendie Agreste, MD  clonazePAM (KLONOPIN) 1 MG tablet TAKE 1 TABLET BY MOUTH TWICE A DAY AS NEEDED 11/04/13  Yes Leandrew Koyanagi, MD  clotrimazole-betamethasone (LOTRISONE) cream Apply topically 2 (two) times daily. 08/16/12  Yes Leandrew Koyanagi, MD  cyclobenzaprine (FLEXERIL) 10 MG tablet TAKE 1 TABLET (10 MG TOTAL) BY MOUTH 3 (THREE) TIMES DAILY AS NEEDED FOR MUSCLE SPASMS. 05/22/13  Yes Leandrew Koyanagi, MD  estradiol (ESTRACE) 1 MG tablet Take 1 tablet (1 mg total) by mouth daily. 05/27/13  Yes Terrance Mass, MD  levothyroxine (SYNTHROID, LEVOTHROID) 175 MCG tablet TAKE 1 TABLET (175 MCG TOTAL) BY MOUTH DAILY. 09/21/13  Yes Leandrew Koyanagi, MD  metroNIDAZOLE (METROGEL) 0.75 % vaginal gel Place 1 Applicatorful vaginally 2 (two) times daily. Leave on file and may request monthly if needed for the next 5-6 months 08/16/12  Yes Leandrew Koyanagi, MD  montelukast (SINGULAIR) 10 MG tablet TAKE 1 TABLET (10 MG TOTAL) BY MOUTH AT BEDTIME. 12/06/12  Yes Theda Sers,  PA-C  oxyCODONE-acetaminophen (PERCOCET) 10-325 MG per tablet Take 1 tablet by mouth every 6 (six) hours as needed for pain. 11/21/13  Yes Leandrew Koyanagi, MD  progesterone (PROMETRIUM) 200 MG capsule Take one tablet daily for 12 days of the month 05/27/13  Yes Terrance Mass, MD  QVAR 40 MCG/ACT inhaler INHALE 1 PUFF INTO THE LUNGS 2 (TWO) TIMES DAILY. 11/01/13  Yes Leandrew Koyanagi, MD  traMADol (ULTRAM) 50 MG tablet Take 1-2 tablets (50-100 mg total) by mouth every 6 (six) hours as needed. 11/21/13  Yes Leandrew Koyanagi, MD  triamterene-hydrochlorothiazide (DYAZIDE) 37.5-25 MG per capsule Take 1 each (1 capsule total) by mouth daily.  07/03/13  Yes Leandrew Koyanagi, MD  venlafaxine XR (EFFEXOR-XR) 150 MG 24 hr capsule TAKE 1 CAPSULE (150 MG TOTAL) BY MOUTH DAILY WITH BREAKFAST. 12/08/13  Yes Leandrew Koyanagi, MD  VENTOLIN HFA 108 (90 BASE) MCG/ACT inhaler INHALE 2 PUFFS INTO THE LUNGS EVERY 4 (FOUR) HOURS AS NEEDED FOR WHEEZING. 11/01/13  Yes Leandrew Koyanagi, MD  verapamil (VERELAN PM) 360 MG 24 hr capsule Take 1 capsule (360 mg total) by mouth at bedtime. 08/02/13  Yes Leandrew Koyanagi, MD  fluconazole (DIFLUCAN) 150 MG tablet Take 1 tablet (150 mg total) by mouth once. and 1 tablet 1 week later 08/16/13   Leandrew Koyanagi, MD  rizatriptan (MAXALT) 10 MG tablet TAKE 1 TABLET BY MOUTH AS NEEDED FOR MIGRAINE. MAY REPEAT IN 2 HOURS IF NEEDED 04/06/13   Leandrew Koyanagi, MD      Review of Systems  Constitutional: Negative for fever and chills.  HENT: Positive for ear pain.   Respiratory: Positive for cough.   Skin: Positive for rash (on chest).  All other systems reviewed and are negative.      Objective:   Physical Exam  Constitutional: She is oriented to person, place, and time. She appears well-developed and well-nourished. No distress.  HENT:  Head: Normocephalic and atraumatic.  Mouth/Throat: Posterior oropharyngeal erythema present.  Eyes: EOM are normal.  Neck: Neck supple. No tracheal deviation present.  Cardiovascular: Normal rate, regular rhythm and normal heart sounds.   Pulmonary/Chest: Effort normal and breath sounds normal. No respiratory distress.  Musculoskeletal: Normal range of motion.  Lymphadenopathy:    She has cervical adenopathy.  Neurological: She is alert and oriented to person, place, and time.  Skin: Skin is warm and dry.  Psychiatric: She has a normal mood and affect. Her behavior is normal.  Nursing note and vitals reviewed.     BP 156/100 mmHg  Pulse 100  Temp(Src) 98 F (36.7 C) (Oral)  Resp 16  Ht 5' 4.5" (1.638 m)  Wt 178 lb 2 oz (80.797 kg)  BMI 30.11 kg/m2   SpO2 96%  LMP 06/17/2002  Assessment & Plan:  DIAGNOSTIC STUDIES: Oxygen Saturation is 96% on room air, normal by my interpretation.    COORDINATION OF CARE: 12:02 PM-Discussed treatment plan which includes amoxicillin Rx with pt at bedside and pt agreed to plan.   I personally performed the services described in this documentation, which was scribed in my presence. The recorded information has been reviewed and is accurate.    Acute pharyngitis, unspecified pharyngitis type - Plan: amoxicillin (AMOXIL) 875 MG tablet  Signed, Robyn Haber, MD

## 2013-12-10 NOTE — Patient Instructions (Signed)

## 2013-12-20 ENCOUNTER — Telehealth: Payer: Self-pay

## 2013-12-20 DIAGNOSIS — B379 Candidiasis, unspecified: Secondary | ICD-10-CM

## 2013-12-20 NOTE — Telephone Encounter (Signed)
PATIENT STATES SHE SAW DR. Joseph Art ABOUT 2 WEEKS AGO FOR A SORE THROAT. HE PRESCRIBED HER A STRONG ANTIBIOTIC AND NOW SHE HAS A YEAST INFECTION. SHE WOULD LIKE TO GET SOME DIFLUCAN PILLS CALLED INTO HER PHARMACY. BEST PHONE [phone] (CELL)  PHARMACY CHOICE IS [facility] IN [place] ON [address].  [place]

## 2013-12-21 MED ORDER — FLUCONAZOLE 150 MG PO TABS: 150.0000 mg | ORAL_TABLET | Freq: Once | ORAL | Status: DC

## 2013-12-21 NOTE — Telephone Encounter (Signed)
Sent medication to the pharmacy per protocol.

## 2014-01-07 ENCOUNTER — Telehealth: Payer: Self-pay

## 2014-01-07 NOTE — Telephone Encounter (Signed)
The patient called to report that she lost her job on 01/06/14 and no longer has health insurance.  She is being treated by Dr. Laney Pastor (PCP), for medical issues including pinched nerve in neck and she is worried about how to proceed/handle this situation with no insurance.  The patient requests return call to discuss situation.  Please call the patient at [phone]

## 2014-01-09 NOTE — Telephone Encounter (Signed)
Clld pt - advsd to contact [facility] at [phone] to speak to a representative concerning her loss of Health insurance and seeking assistance.

## 2014-01-17 ENCOUNTER — Ambulatory Visit (INDEPENDENT_AMBULATORY_CARE_PROVIDER_SITE_OTHER): Payer: BC Managed Care – PPO | Admitting: Internal Medicine

## 2014-01-17 VITALS — BP 160/100 | HR 92 | Temp 98.3°F | Resp 18 | Ht 64.5 in | Wt 173.8 lb

## 2014-01-17 DIAGNOSIS — J4520 Mild intermittent asthma, uncomplicated: Secondary | ICD-10-CM

## 2014-01-17 DIAGNOSIS — I10 Essential (primary) hypertension: Secondary | ICD-10-CM

## 2014-01-17 DIAGNOSIS — E890 Postprocedural hypothyroidism: Secondary | ICD-10-CM

## 2014-01-17 DIAGNOSIS — C73 Malignant neoplasm of thyroid gland: Secondary | ICD-10-CM

## 2014-01-17 DIAGNOSIS — F329 Major depressive disorder, single episode, unspecified: Secondary | ICD-10-CM

## 2014-01-17 DIAGNOSIS — M5010 Cervical disc disorder with radiculopathy, unspecified cervical region: Secondary | ICD-10-CM

## 2014-01-17 DIAGNOSIS — B379 Candidiasis, unspecified: Secondary | ICD-10-CM

## 2014-01-17 DIAGNOSIS — F32A Depression, unspecified: Secondary | ICD-10-CM

## 2014-01-17 DIAGNOSIS — J302 Other seasonal allergic rhinitis: Secondary | ICD-10-CM

## 2014-01-17 MED ORDER — FLUCONAZOLE 150 MG PO TABS: 150.0000 mg | ORAL_TABLET | Freq: Once | ORAL | Status: DC

## 2014-01-17 MED ORDER — CLONAZEPAM 1 MG PO TABS: 1.0000 mg | ORAL_TABLET | Freq: Three times a day (TID) | ORAL | Status: DC | PRN

## 2014-01-17 MED ORDER — CYCLOBENZAPRINE HCL 10 MG PO TABS: ORAL_TABLET | ORAL | Status: DC

## 2014-01-17 MED ORDER — AMOXICILLIN-POT CLAVULANATE 875-125 MG PO TABS: 1.0000 | ORAL_TABLET | Freq: Two times a day (BID) | ORAL | Status: DC

## 2014-01-17 MED ORDER — VENLAFAXINE HCL ER 150 MG PO CP24: ORAL_CAPSULE | ORAL | Status: DC

## 2014-01-17 MED ORDER — ALBUTEROL SULFATE HFA 108 (90 BASE) MCG/ACT IN AERS: INHALATION_SPRAY | RESPIRATORY_TRACT | Status: DC

## 2014-01-17 MED ORDER — BECLOMETHASONE DIPROPIONATE 40 MCG/ACT IN AERS: INHALATION_SPRAY | RESPIRATORY_TRACT | Status: DC

## 2014-01-17 MED ORDER — MONTELUKAST SODIUM 10 MG PO TABS: ORAL_TABLET | ORAL | Status: DC

## 2014-01-17 MED ORDER — TRAMADOL HCL 50 MG PO TABS: 50.0000 mg | ORAL_TABLET | Freq: Four times a day (QID) | ORAL | Status: DC | PRN

## 2014-01-17 NOTE — Patient Instructions (Signed)
[facility]

## 2014-01-17 NOTE — Progress Notes (Signed)
Subjective:  This chart was scribed for Stacey Russian, MD by [name], ED Scribe. The patient was seen in room 1. Patient's care was started at 9:29 AM.   Patient ID: [name], female    DOB: 1960-02-11, 53 y.o.   MRN: [id number]  Chief Complaint  Patient presents with  . Cough    chest congestion , wheezing, SOB x before Christmas  . Fever    at night 101   HPI HPI Comments: [name] is a 53 y.o. female, with a h/o HTN, asthma, neuromuscular disorder, hypothyroidism, depression, who presents to the [facility] complaining of persistent cough over the past week. She reports associated congestion, postnasal drip, chest pain only with coughing. Pt has been treating with Albuterol inhaler.  Pinched Nerve  Pt states that she was seen for a pinched nerve in her neck. She was given an injection that she states exacerbated her symptoms. Pt was supposed to follow-up at the end of December to discuss a new treatment plan but did not due to losing her insurance. Pt has applied for Medicaid but states that Medicaid is not accepted at the office.   Job Pt states that she lost her job 11 days ago following a copying error. She states "I just don't know what I'm going to do" and "I have to figure out how I'm going to pay the mortgage". Very upset  Pt's son is 6 y.o now. Husband is underemployed and no entrance available. She has applied for Medicaid.  Patient Active Problem List   Diagnosis Date Noted  . Thyroid cancer 05/13/2011    Priority: Medium  . Hypothyroidism, postsurgical 05/13/2011    Priority: Medium  . Hypertension 12/25/2010    Priority: Medium  . Cervical disc disorder with radiculopathy of cervical region 10/08/2013  . 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 Menopause 06/17/2011  . Allergic rhinitis 05/13/2011  . Reactive airway disease 05/13/2011  . Depressed 05/13/2011  . Migraine  05/13/2011    Current Outpatient Prescriptions on File Prior to Visit  Medication Sig Dispense Refill  . acyclovir (ZOVIRAX) 200 MG capsule TAKE 2 CAPSULES BY MOUTH 2 TIMES DAILY. 120 capsule 4  . calcitRIOL (ROCALTROL) 0.25 MCG capsule TAKE 2 CAPSULES (0.5 MCG TOTAL) BY MOUTH DAILY. 180 capsule 3  . clonazePAM (KLONOPIN) 1 MG tablet TAKE 1 TABLET BY MOUTH TWICE A DAY AS NEEDED 180 tablet 0  . clotrimazole-betamethasone (LOTRISONE) cream Apply topically 2 (two) times daily. 30 g 0  . cyclobenzaprine (FLEXERIL) 10 MG tablet TAKE 1 TABLET (10 MG TOTAL) BY MOUTH 3 (THREE) TIMES DAILY AS NEEDED FOR MUSCLE SPASMS. 30 tablet 2  . estradiol (ESTRACE) 1 MG tablet Take 1 tablet (1 mg total) by mouth daily. 90 tablet 4  . fluconazole (DIFLUCAN) 150 MG tablet Take 1 tablet (150 mg total) by mouth once. and 1 tablet 1 week later 2 tablet 0  . levothyroxine (SYNTHROID, LEVOTHROID) 175 MCG tablet TAKE 1 TABLET (175 MCG TOTAL) BY MOUTH DAILY. 90 tablet 1  . metroNIDAZOLE (METROGEL) 0.75 % vaginal gel Place 1 Applicatorful vaginally 2 (two) times daily. Leave on file and may request monthly if needed for the next 5-6 months 70 g 5  . montelukast (SINGULAIR) 10 MG tablet TAKE 1 TABLET (10 MG TOTAL) BY MOUTH AT BEDTIME. 90 tablet 3  . oxyCODONE-acetaminophen (PERCOCET) 10-325 MG per tablet Take 1 tablet by mouth every 6 (six) hours as needed  for pain. 30 tablet 0  . progesterone (PROMETRIUM) 200 MG capsule Take one tablet daily for 12 days of the month 40 capsule 4  . QVAR 40 MCG/ACT inhaler INHALE 1 PUFF INTO THE LUNGS 2 (TWO) TIMES DAILY. 8.7 g 0  . rizatriptan (MAXALT) 10 MG tablet TAKE 1 TABLET BY MOUTH AS NEEDED FOR MIGRAINE. MAY REPEAT IN 2 HOURS IF NEEDED 12 tablet 5  . traMADol (ULTRAM) 50 MG tablet Take 1-2 tablets (50-100 mg total) by mouth every 6 (six) hours as needed. 60 tablet 2  . triamterene-hydrochlorothiazide (DYAZIDE) 37.5-25 MG per capsule Take 1 each (1 capsule total) by mouth daily. 90  capsule 3  . venlafaxine XR (EFFEXOR-XR) 150 MG 24 hr capsule TAKE 1 CAPSULE (150 MG TOTAL) BY MOUTH DAILY WITH BREAKFAST. 30 capsule 2  . VENTOLIN HFA 108 (90 BASE) MCG/ACT inhaler INHALE 2 PUFFS INTO THE LUNGS EVERY 4 (FOUR) HOURS AS NEEDED FOR WHEEZING. 18 each 1  . verapamil (VERELAN PM) 360 MG 24 hr capsule Take 1 capsule (360 mg total) by mouth at bedtime. 90 capsule 3   No current facility-administered medications on file prior to visit.   Allergies  Allergen Reactions  . Codeine   . Lisinopril    Review of Systems  HENT: Positive for congestion and postnasal drip.   Respiratory: Positive for cough.   Cardiovascular: Positive for chest pain (only with cough).      Objective:   Physical Exam  Constitutional: She is oriented to person, place, and time. She appears well-developed and well-nourished.  Pt is in tears.   HENT:  Head: Normocephalic and atraumatic.  Right Ear: External ear normal.  Left Ear: External ear normal.  Mouth/Throat: Oropharynx is clear and moist.  Purulent discharge from the nose with tender maxillary areas.  Eyes: Conjunctivae and EOM are normal. Pupils are equal, round, and reactive to light.  Neck: Neck supple.  Cardiovascular: Normal rate.   Pulmonary/Chest: Effort normal and breath sounds normal.  Musculoskeletal: Normal range of motion.  Lymphadenopathy:    She has no cervical adenopathy.  Neurological: She is alert and oriented to person, place, and time.  Skin: Skin is warm and dry.  Psychiatric: Thought content normal.  Greatly distressed over lost of job and current financial situation but judgment is sound and content is normal.   Nursing note and vitals reviewed. BP 160/100 mmHg  Pulse 92  Temp(Src) 98.3 F (36.8 C) (Oral)  Resp 18  Ht 5' 4.5" (1.638 m)  Wt 173 lb 12.8 oz (78.835 kg)  BMI 29.38 kg/m2  SpO2 97%  LMP 06/17/2002    Assessment & Plan:  Cervical disc disorder with radiculopathy of cervical region - Plan: traMADol  (ULTRAM) 50 MG tablet--- unfortunately this is in a state of incomplete resolution with ongoing orthopedic care which now may not be affordable  Yeast infection - Plan: fluconazole (DIFLUCAN) 150 MG tablet//status post antibiotics  Essential hypertension--- uncontrolled secondary to current anxiety Continue medications  Thyroid cancer with Hypothyroidism, postsurgical  Other seasonal allergic rhinitis with Reactive airway disease, mild intermittent, will continue inhalers  Depressed--continue Effexor  Acute sinusitis/recurrent sinusitis Augmentin  Meds ordered this encounter  Medications  . amoxicillin-clavulanate (AUGMENTIN) 875-125 MG per tablet    Sig: Take 1 tablet by mouth 2 (two) times daily.    Dispense:  40 tablet    Refill:  0  . albuterol (VENTOLIN HFA) 108 (90 BASE) MCG/ACT inhaler    Sig: INHALE 2 PUFFS INTO THE LUNGS  EVERY 4 (FOUR) HOURS AS NEEDED FOR WHEEZING.    Dispense:  18 g    Refill:  3  . venlafaxine XR (EFFEXOR-XR) 150 MG 24 hr capsule    Sig: TAKE 1 CAPSULE (150 MG TOTAL) BY MOUTH DAILY WITH BREAKFAST.    Dispense:  90 capsule    Refill:  2  . traMADol (ULTRAM) 50 MG tablet    Sig: Take 1-2 tablets (50-100 mg total) by mouth every 6 (six) hours as needed.    Dispense:  240 tablet    Refill:  2  . beclomethasone (QVAR) 40 MCG/ACT inhaler    Sig: INHALE 1 PUFF INTO THE LUNGS 2 (TWO) TIMES DAILY.    Dispense:  8.7 g    Refill:  3  . montelukast (SINGULAIR) 10 MG tablet    Sig: TAKE 1 TABLET (10 MG TOTAL) BY MOUTH AT BEDTIME.    Dispense:  90 tablet    Refill:  3  . fluconazole (DIFLUCAN) 150 MG tablet    Sig: Take 1 tablet (150 mg total) by mouth once. and 1 tablet 1 week later    Dispense:  2 tablet    Refill:  0  . cyclobenzaprine (FLEXERIL) 10 MG tablet    Sig: TAKE 1 TABLET (10 MG TOTAL) BY MOUTH 3 (THREE) TIMES DAILY AS NEEDED FOR MUSCLE SPASMS.    Dispense:  30 tablet    Refill:  2  . clonazePAM (KLONOPIN) 1 MG tablet    Sig: Take 1 tablet  (1 mg total) by mouth 3 (three) times daily as needed.    Dispense:  90 tablet    Refill:  5    This request is for a new prescription for a controlled substance as required by Federal/State law..    I personally performed the services described in this documentation, which was scribed in my presence. The recorded information has been reviewed and is accurate.

## 2014-03-03 ENCOUNTER — Other Ambulatory Visit: Payer: Self-pay | Admitting: Internal Medicine

## 2014-03-04 ENCOUNTER — Other Ambulatory Visit: Payer: Self-pay

## 2014-03-04 ENCOUNTER — Telehealth: Payer: Self-pay

## 2014-03-04 DIAGNOSIS — M5010 Cervical disc disorder with radiculopathy, unspecified cervical region: Secondary | ICD-10-CM

## 2014-03-04 NOTE — Telephone Encounter (Signed)
Patient is requesting a medication refill [facility] [address] [facility] [place]

## 2014-03-04 NOTE — Telephone Encounter (Signed)
Patient requesting a call directly from Dr Laney Pastor in regards to a pinch nerve in her back. Patient stated it was not regarding a medication refill. I informed patient Dr Laney Pastor was out of the office till Tuesday and a nurse may call her back to check on her. Patient stated she didn't want to speak with a nurse only Dr Laney Pastor. CB# [phone]

## 2014-03-05 NOTE — Telephone Encounter (Signed)
Hold for Dr Laney Pastor.

## 2014-03-06 MED ORDER — OXYCODONE-ACETAMINOPHEN 10-325 MG PO TABS: 1.0000 | ORAL_TABLET | Freq: Four times a day (QID) | ORAL | Status: DC | PRN

## 2014-03-06 NOTE — Telephone Encounter (Signed)
Dr Laney Pastor, you have seen pt recently, but not for migraines. Do you want to give RFs?

## 2014-03-06 NOTE — Telephone Encounter (Signed)
Percocet Rx can not be sent to pharm, pt must p/up hard copy. Pended and sent to Dr Laney Pastor.

## 2014-03-07 ENCOUNTER — Ambulatory Visit (INDEPENDENT_AMBULATORY_CARE_PROVIDER_SITE_OTHER): Payer: BC Managed Care – PPO | Admitting: Internal Medicine

## 2014-03-07 VITALS — BP 139/95 | HR 95 | Temp 97.9°F | Resp 18 | Wt 171.0 lb

## 2014-03-07 DIAGNOSIS — M5010 Cervical disc disorder with radiculopathy, unspecified cervical region: Secondary | ICD-10-CM

## 2014-03-07 DIAGNOSIS — I10 Essential (primary) hypertension: Secondary | ICD-10-CM

## 2014-03-07 DIAGNOSIS — C73 Malignant neoplasm of thyroid gland: Secondary | ICD-10-CM

## 2014-03-07 DIAGNOSIS — F32A Depression, unspecified: Secondary | ICD-10-CM

## 2014-03-07 DIAGNOSIS — F329 Major depressive disorder, single episode, unspecified: Secondary | ICD-10-CM

## 2014-03-07 DIAGNOSIS — E890 Postprocedural hypothyroidism: Secondary | ICD-10-CM

## 2014-03-07 NOTE — Progress Notes (Signed)
Subjective:    Patient ID: [name], female    DOB: 1961/01/05, 54 y.o.   MRN: [id number]  HPI  Chief Complaint  Patient presents with  . Follow-up  . 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 In November she was fired for something she didn't do. The school recently recognized this and rehired her. Now however the principal at the school has presented her with a several pages long packet of remedial things that she must do in order to meet approval. Many of these items are about what she was accused of doing but later told that they recognize she didn't do any of this. This reinforces her story of a longtime problem with her current principal. She feels it is impossible to comply with all the things itemized in her packet. I reviewed each one of them with her and I agree. It would be overwhelming for anyone to try to accomplish this many items over a two-month period as required. Many of the items have to do with apical behavior and remediation in subject matters like math, neither which has been a problem for her. She has been to the teachers in who offered little help other than to suggest away to avoid the request for mediation.  At the same time she continues to suffer from a cervical radiculopathy that has made her shoulder and arm constantly in pain with progressive weakness. Her evaluation and treatment by orthopedics, Dr. Terri Skains, was interrupted by her loss of insurance when she was fired as she was unable to afford the prescribed interventions. Her insurance restarted yesterday and she has an appointment at his first available slot in early March. She continues to being significant pain requiring narcotic therapy at times. She is unable to use her hand for gripping or writing or keyboarding. After sitting to work for more than a few minutes her shoulder begins to hurt requiring her to get up and move around and  stretch. Her neck discomfort continues to interfere with her sleep.  She is very anxious at the thought that they are trying to eliminate her and that she will possibly not have a job. 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 She is at a very poorly performing school that required government intervention based on the record prior to her arrival there is a Pharmacist, hospital.  Her other medical problems are significant also: Patient Active Problem List   Diagnosis Date Noted  . Thyroid cancer 05/13/2011  . Hypothyroidism, postsurgical-Synthroid 175 MCG  05/13/2011  . Hypertension--Dyazide and Verelan  12/25/2010  . 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 Postsurgical hypoparathyroidism-calcitrol .5 08/16/2012  . Menopause-Prometrium /estradiol  06/17/2011  . Allergic rhinitis 05/13/2011  . Reactive airway disease--Qvar, Singulair, albuterol  05/13/2011  . Depression with anxiety -Effexor and Klonopin  05/13/2011  . Migraine HAs--Maxalt  05/13/2011   It is significant that the MRI of her neck suggested a possible lytic lesion-possibly related to her thyroid cancer- and this is part of her continued evaluation by orthopedics   Review of Systems No fever chills or night sweats No unaccounted for weight loss No chest pain or palpitations Her reactive airway disease continues to respond to medications She remains on active thyroid replacement Her migraine headaches are about the same in frequency and intensity and still respond somewhat to medications No  peripheral edema No new numbness or weakness No vision changes    Objective:   Physical Exam BP 139/95 mmHg  Pulse 95  Temp(Src) 97.9 F (36.6 C) (Oral)  Resp 18  Wt 171 lb (77.565 kg)  SpO2 98%  LMP 06/17/2002 PERRLA and EOMs conjugate No thyromegaly or cervical area nodules or lymphadenopathy The neck range of motion is limited by pain with  radiculopathy The right shoulder range of motion is limited by discomfort There is no weakness of abduction or abduction against resistance to suggest a rotator cuff injury The right hand has weakness of grip with no sensory loss Heart regular No peripheral edema Cranial nerves II through XII intact Gait normal Mood is very tearful with anxiousness and depression both//this affect may be appropriate given her circumstances and her underlying illnesses///her thought content is normal except for not being able to mount a sustained fight against this adversity//her judgment is currently impaired by all of the above       Assessment & Plan:  Cervical disc disorder with radiculopathy of cervical region--this places significant limitations 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 Not the least of this is her need for narcotics in order to control pain which might impair her judgment is a Pharmacist, hospital. This should not be a long-term need as she should respond to therapy by orthopedics.  Depression-this is certainly made worse by her current circumstances and medications will be continued//we will explore counseling as well  Essential hypertension--marginal control secondary to stress  Thyroid cancer--- very important to be certain that her cervical dilemma is not secondary to a lytic lesion  Hypothyroidism, postsurgical--- she continues with successful replacement therapy  No orders of the defined types were placed in this encounter.   She is referred to [employer] for further consideration of her employment predicament I have recently refilled her Percocet and tramadol and Flexeril-she continues to use Percocet only as a last resort

## 2014-03-08 ENCOUNTER — Telehealth: Payer: Self-pay | Admitting: *Deleted

## 2014-03-08 NOTE — Telephone Encounter (Signed)
Patient wants Dr. Laney Pastor to call as soon as possible.    Regarding Work Note wrote on 03/07/2014.  Her job is requesting more information to be added to the work note. Expected Return Date is needed, but stated she need to talk to Dr. Laney Pastor about that date.  Pt # [phone]

## 2014-03-08 NOTE — Telephone Encounter (Signed)
Pt was in to see Dr Laney Pastor on 03/07/14.

## 2014-03-13 NOTE — Telephone Encounter (Signed)
Faxed to [employer] at [phone]

## 2014-03-14 ENCOUNTER — Telehealth: Payer: Self-pay

## 2014-03-14 NOTE — Telephone Encounter (Signed)
Pt dropped off FMLA ppw for completion in 5-7 business days. States that she has to have it in by March 3rd. Please return to Disability box once complete. Jasmine or myself will scan into system and contact pt for pick up.

## 2014-03-16 ENCOUNTER — Encounter: Payer: Self-pay | Admitting: Internal Medicine

## 2014-03-17 NOTE — Telephone Encounter (Signed)
Notified patient via VM that her FMLA forms have been completed and they are ready for pick up. Thank you Dr. Laney Pastor for timely completion of this paperwork :)  Thanks, [facility]

## 2014-04-07 DIAGNOSIS — Z0271 Encounter for disability determination: Secondary | ICD-10-CM

## 2014-05-26 ENCOUNTER — Other Ambulatory Visit: Payer: Self-pay | Admitting: Internal Medicine

## 2014-05-26 ENCOUNTER — Other Ambulatory Visit: Payer: Self-pay | Admitting: Family Medicine

## 2014-05-26 ENCOUNTER — Telehealth: Payer: Self-pay | Admitting: Internal Medicine

## 2014-05-26 MED ORDER — AMOXICILLIN-POT CLAVULANATE 875-125 MG PO TABS: 1.0000 | ORAL_TABLET | Freq: Two times a day (BID) | ORAL | Status: DC

## 2014-05-26 NOTE — Telephone Encounter (Signed)
This is # of an investment firm

## 2014-05-26 NOTE — Telephone Encounter (Signed)
Pt called back. She wants you to call her back.

## 2014-05-26 NOTE — Telephone Encounter (Signed)
Recurrent sinusitis Still uninsured Meds ordered this encounter  Medications  . amoxicillin-clavulanate (AUGMENTIN) 875-125 MG per tablet    Sig: Take 1 tablet by mouth 2 (two) times daily.    Dispense:  20 tablet    Refill:  0

## 2014-05-26 NOTE — Telephone Encounter (Signed)
Attempted to call pt. lmom to cb.

## 2014-05-26 NOTE — Telephone Encounter (Signed)
Patient is asking that Dr. Laney Pastor return her phone call. She did not provide any details.  [phone]

## 2014-05-26 NOTE — Telephone Encounter (Signed)
Sorry. The number she provided was a work number. But here is her cell number [phone] (M)

## 2014-06-08 ENCOUNTER — Telehealth: Payer: Self-pay

## 2014-06-08 NOTE — Telephone Encounter (Signed)
Can we rx this?

## 2014-06-08 NOTE — Telephone Encounter (Signed)
Patient has been taking an antibiotic for a sinus infection and has started to have yeast in her mouth and in vagina area. Patient is requesting 2 prescriptions of D[facility] [place] on [address]. Patients call back number is [phone]

## 2014-06-09 NOTE — Telephone Encounter (Signed)
Patient is calling back to ask on the status of the prescription she requested.  She is upset that no one has called her back or sent the rx in yet.  CB#: [phone]

## 2014-06-10 MED ORDER — FLUCONAZOLE 150 MG PO TABS: 150.0000 mg | ORAL_TABLET | Freq: Once | ORAL | Status: DC

## 2014-06-10 NOTE — Telephone Encounter (Signed)
I sent her in Diflucan pills.  Hopefully this will help with both.  If she is still having problems please have her RTC.  Getting a vaginal yeast infection from abx is very common.   Getting thrush is not so if she is still having problems with her mouth a OV would be helpful.  Please make sure she is rinsing her mouth after her QVAR inhaler as that is a steroid which can cause thrush.

## 2014-06-11 NOTE — Telephone Encounter (Signed)
Patient informed. 

## 2014-06-29 ENCOUNTER — Encounter: Payer: Self-pay | Admitting: *Deleted

## 2014-06-29 ENCOUNTER — Telehealth: Payer: Self-pay

## 2014-06-29 DIAGNOSIS — M5010 Cervical disc disorder with radiculopathy, unspecified cervical region: Secondary | ICD-10-CM

## 2014-06-29 NOTE — Telephone Encounter (Signed)
Patient requesting refills on both of her pain medications "Oxycodone" and "Tramadol". She want to also let Dr Laney Pastor know she has been scheduled for her first surgery the second week in August by Dr Tia Alert. Patient is requesting this message to go straight to Dr Laney Pastor. Her call back number is [phone]

## 2014-06-30 MED ORDER — OXYCODONE-ACETAMINOPHEN 10-325 MG PO TABS: 1.0000 | ORAL_TABLET | Freq: Four times a day (QID) | ORAL | Status: DC | PRN

## 2014-06-30 MED ORDER — TRAMADOL HCL 50 MG PO TABS: 50.0000 mg | ORAL_TABLET | Freq: Four times a day (QID) | ORAL | Status: DC | PRN

## 2014-06-30 NOTE — Telephone Encounter (Signed)
Pt notified rx's are ready.

## 2014-07-21 ENCOUNTER — Other Ambulatory Visit: Payer: Self-pay | Admitting: Internal Medicine

## 2014-07-28 ENCOUNTER — Other Ambulatory Visit: Payer: Self-pay | Admitting: Orthopedic Surgery

## 2014-08-01 ENCOUNTER — Other Ambulatory Visit: Payer: Self-pay | Admitting: Gynecology

## 2014-08-15 ENCOUNTER — Encounter (HOSPITAL_BASED_OUTPATIENT_CLINIC_OR_DEPARTMENT_OTHER): Payer: Self-pay | Admitting: *Deleted

## 2014-08-21 ENCOUNTER — Ambulatory Visit (HOSPITAL_BASED_OUTPATIENT_CLINIC_OR_DEPARTMENT_OTHER)
Admission: RE | Admit: 2014-08-21 | Payer: BC Managed Care – PPO | Source: Ambulatory Visit | Admitting: Orthopedic Surgery

## 2014-08-21 ENCOUNTER — Encounter (HOSPITAL_BASED_OUTPATIENT_CLINIC_OR_DEPARTMENT_OTHER): Admission: RE | Payer: Self-pay | Source: Ambulatory Visit

## 2014-08-21 SURGERY — SHOULDER ARTHROSCOPY WITH SUBACROMIAL DECOMPRESSION
Anesthesia: Choice | Laterality: Right

## 2014-08-23 ENCOUNTER — Other Ambulatory Visit: Payer: Self-pay | Admitting: Gynecology

## 2014-09-03 ENCOUNTER — Ambulatory Visit (INDEPENDENT_AMBULATORY_CARE_PROVIDER_SITE_OTHER): Payer: BC Managed Care – PPO | Admitting: Family Medicine

## 2014-09-03 VITALS — BP 136/96 | HR 91 | Temp 98.2°F | Resp 16 | Ht 65.5 in | Wt 169.0 lb

## 2014-09-03 DIAGNOSIS — J0100 Acute maxillary sinusitis, unspecified: Secondary | ICD-10-CM

## 2014-09-03 DIAGNOSIS — B373 Candidiasis of vulva and vagina: Secondary | ICD-10-CM

## 2014-09-03 DIAGNOSIS — B3731 Acute candidiasis of vulva and vagina: Secondary | ICD-10-CM

## 2014-09-03 MED ORDER — AMOXICILLIN-POT CLAVULANATE 875-125 MG PO TABS: 1.0000 | ORAL_TABLET | Freq: Two times a day (BID) | ORAL | Status: DC

## 2014-09-03 MED ORDER — FLUCONAZOLE 150 MG PO TABS: 150.0000 mg | ORAL_TABLET | Freq: Once | ORAL | Status: DC

## 2014-09-03 NOTE — Patient Instructions (Signed)
Start Augmentin for current sinus infection, salt water nasal spray as discussed at least 4-5 times per day. Tylenol or Motrin as needed. Drink plenty of fluids, and see other information below. There is some irritation in the nasal mucosa, so you can decrease Flonase to every other day for now.  If you do have signs or symptoms of a yeast infection after completion of antibiotic, you can fill the Diflucan that was printed today.  Return to the clinic or go to the nearest emergency room if any of your symptoms worsen or new symptoms occur.  Sinusitis Sinusitis is redness, soreness, and inflammation of the paranasal sinuses. Paranasal sinuses are air pockets within the bones of your face (beneath the eyes, the middle of the forehead, or above the eyes). In healthy paranasal sinuses, mucus is able to drain out, and air is able to circulate through them by way of your nose. However, when your paranasal sinuses are inflamed, mucus and air can become trapped. This can allow bacteria and other germs to grow and cause infection. Sinusitis can develop quickly and last only a short time (acute) or continue over a long period (chronic). Sinusitis that lasts for more than 12 weeks is considered chronic.  CAUSES  Causes of sinusitis include:  Allergies.  Structural abnormalities, such as displacement of the cartilage that separates your nostrils (deviated septum), which can decrease the air flow through your nose and sinuses and affect sinus drainage.  Functional abnormalities, such as when the small hairs (cilia) that line your sinuses and help remove mucus do not work properly or are not present. SIGNS AND SYMPTOMS  Symptoms of acute and chronic sinusitis are the same. The primary symptoms are pain and pressure around the affected sinuses. Other symptoms include:  Upper toothache.  Earache.  Headache.  Bad breath.  Decreased sense of smell and taste.  A cough, which worsens when you are lying  flat.  Fatigue.  Fever.  Thick drainage from your nose, which often is green and may contain pus (purulent).  Swelling and warmth over the affected sinuses. DIAGNOSIS  Your health care provider will perform a physical exam. During the exam, your health care provider may:  Look in your nose for signs of abnormal growths in your nostrils (nasal polyps).  Tap over the affected sinus to check for signs of infection.  View the inside of your sinuses (endoscopy) using an imaging device that has a light attached (endoscope). If your health care provider suspects that you have chronic sinusitis, one or more of the following tests may be recommended:  Allergy tests.  Nasal culture. A sample of mucus is taken from your nose, sent to a lab, and screened for bacteria.  Nasal cytology. A sample of mucus is taken from your nose and examined by your health care provider to determine if your sinusitis is related to an allergy. TREATMENT  Most cases of acute sinusitis are related to a viral infection and will resolve on their own within 10 days. Sometimes medicines are prescribed to help relieve symptoms (pain medicine, decongestants, nasal steroid sprays, or saline sprays).  However, for sinusitis related to a bacterial infection, your health care provider will prescribe antibiotic medicines. These are medicines that will help kill the bacteria causing the infection.  Rarely, sinusitis is caused by a fungal infection. In theses cases, your health care provider will prescribe antifungal medicine. For some cases of chronic sinusitis, surgery is needed. Generally, these are cases in which sinusitis recurs more  than 3 times per year, despite other treatments. HOME CARE INSTRUCTIONS   Drink plenty of water. Water helps thin the mucus so your sinuses can drain more easily.  Use a humidifier.  Inhale steam 3 to 4 times a day (for example, sit in the bathroom with the shower running).  Apply a warm,  moist washcloth to your face 3 to 4 times a day, or as directed by your health care provider.  Use saline nasal sprays to help moisten and clean your sinuses.  Take medicines only as directed by your health care provider.  If you were prescribed either an antibiotic or antifungal medicine, finish it all even if you start to feel better. SEEK IMMEDIATE MEDICAL CARE IF:  You have increasing pain or severe headaches.  You have nausea, vomiting, or drowsiness.  You have swelling around your face.  You have vision problems.  You have a stiff neck.  You have difficulty breathing. MAKE SURE YOU:   Understand these instructions.  Will watch your condition.  Will get help right away if you are not doing well or get worse. Document Released: 01/06/2005 Document Revised: 05/23/2013 Document Reviewed: 01/21/2011 [facility] Patient Information 2015 [place], [place]. This information is not intended to replace advice given to you by your health care provider. Make sure you discuss any questions you have with your health care provider.

## 2014-09-03 NOTE — Progress Notes (Signed)
Subjective:  This chart was scribed for Stacey Ray, MD by [facility], medical scribe at [facility].The patient was seen in exam room 02 and the patient's care was started at 11:24 AM.   Patient ID: [name], female    DOB: 1960-02-03, 54 y.o.   MRN: [id number] Chief Complaint  Patient presents with  . Ear Pain    right  . Facial Pain   HPI HPI Comments: [name] is a 54 y.o. female who presents to [facility] complaining of sinus pressure with associated facial pain that radiates to her right ear for over two weeks. Green discharge from nares. Taking mucinex, and flonase for relief. She has a low grade fever for the last three nights, taking advil for relief. Denies cough, shortness of breath, wheezing, and congestion.  Review of Systems  Constitutional: Positive for fever.  HENT: Positive for ear pain, rhinorrhea and sinus pressure. Negative for congestion.   Respiratory: Negative for cough, shortness of breath and wheezing.       Objective:  BP 136/96 mmHg  Pulse 91  Temp(Src) 98.2 F (36.8 C) (Oral)  Resp 16  Ht 5' 5.5" (1.664 m)  Wt 169 lb (76.658 kg)  BMI 27.69 kg/m2  SpO2 98%  LMP 06/17/2002 Physical Exam  Constitutional: She is oriented to person, place, and time. She appears well-developed and well-nourished. No distress.  HENT:  Head: Normocephalic and atraumatic.  Right Ear: Hearing, tympanic membrane, external ear and ear canal normal.  Left Ear: Hearing, tympanic membrane, external ear and ear canal normal.  Nose: Nose normal.  Mouth/Throat: Oropharynx is clear and moist. No oropharyngeal exudate.  TM pearly grey bilaterally. Edema bilateral turbinates slight raw appearance to nasal mucosa with minimal yellow discharge noted. Bilaterally maxillary sinus tenderness.  Eyes: Conjunctivae and EOM are normal. Pupils are equal, round, and reactive to light.  Neck: Normal range of motion.  No palpable  lymphadenopathy, tender along the right anterior chain.  Cardiovascular: Normal rate, regular rhythm, normal heart sounds and intact distal pulses.  Exam reveals no gallop and no friction rub.   No murmur heard. Pulmonary/Chest: Effort normal and breath sounds normal. No respiratory distress. She has no wheezes. She has no rhonchi.  Musculoskeletal: Normal range of motion.  Neurological: She is alert and oriented to person, place, and time.  Skin: Skin is warm and dry. No rash noted.  Psychiatric: She has a normal mood and affect. Her behavior is normal.  Nursing note and vitals reviewed.     Assessment & Plan:   [name] is a 54 y.o. female Acute maxillary sinusitis, recurrence not specified - Plan: amoxicillin-clavulanate (AUGMENTIN) 875-125 MG per tablet  -Suspected secondary bacterial sinusitis. Start Augmentin, saline nasal spray, and symptomatic care with Tylenol or Motrin as needed.  -With some irritation and nasal mucosa, possibly from use of Flonase. Can decrease frequency of use of this until raw area improves.  -RTC precautions  Candidal vaginitis - Plan: fluconazole (DIFLUCAN) 150 MG tablet  -By history when she has used antibiotics. Prescription printed for Diflucan No. 1 if symptoms of yeast infection after Augmentin.   Meds ordered this encounter  Medications  . amoxicillin-clavulanate (AUGMENTIN) 875-125 MG per tablet    Sig: Take 1 tablet by mouth 2 (two) times daily.    Dispense:  20 tablet    Refill:  0  . fluconazole (DIFLUCAN) 150 MG tablet    Sig: Take 1  tablet (150 mg total) by mouth once.    Dispense:  1 tablet    Refill:  0   Patient Instructions  Start Augmentin for current sinus infection, salt water nasal spray as discussed at least 4-5 times per day. Tylenol or Motrin as needed. Drink plenty of fluids, and see other information below. There is some irritation in the nasal mucosa, so you can decrease Flonase to every other day for now.  If you  do have signs or symptoms of a yeast infection after completion of antibiotic, you can fill the Diflucan that was printed today.  Return to the clinic or go to the nearest emergency room if any of your symptoms worsen or new symptoms occur.  Sinusitis Sinusitis is redness, soreness, and inflammation of the paranasal sinuses. Paranasal sinuses are air pockets within the bones of your face (beneath the eyes, the middle of the forehead, or above the eyes). In healthy paranasal sinuses, mucus is able to drain out, and air is able to circulate through them by way of your nose. However, when your paranasal sinuses are inflamed, mucus and air can become trapped. This can allow bacteria and other germs to grow and cause infection. Sinusitis can develop quickly and last only a short time (acute) or continue over a long period (chronic). Sinusitis that lasts for more than 12 weeks is considered chronic.  CAUSES  Causes of sinusitis include:  Allergies.  Structural abnormalities, such as displacement of the cartilage that separates your nostrils (deviated septum), which can decrease the air flow through your nose and sinuses and affect sinus drainage.  Functional abnormalities, such as when the small hairs (cilia) that line your sinuses and help remove mucus do not work properly or are not present. SIGNS AND SYMPTOMS  Symptoms of acute and chronic sinusitis are the same. The primary symptoms are pain and pressure around the affected sinuses. Other symptoms include:  Upper toothache.  Earache.  Headache.  Bad breath.  Decreased sense of smell and taste.  A cough, which worsens when you are lying flat.  Fatigue.  Fever.  Thick drainage from your nose, which often is green and may contain pus (purulent).  Swelling and warmth over the affected sinuses. DIAGNOSIS  Your health care provider will perform a physical exam. During the exam, your health care provider may:  Look in your nose for  signs of abnormal growths in your nostrils (nasal polyps).  Tap over the affected sinus to check for signs of infection.  View the inside of your sinuses (endoscopy) using an imaging device that has a light attached (endoscope). If your health care provider suspects that you have chronic sinusitis, one or more of the following tests may be recommended:  Allergy tests.  Nasal culture. A sample of mucus is taken from your nose, sent to a lab, and screened for bacteria.  Nasal cytology. A sample of mucus is taken from your nose and examined by your health care provider to determine if your sinusitis is related to an allergy. TREATMENT  Most cases of acute sinusitis are related to a viral infection and will resolve on their own within 10 days. Sometimes medicines are prescribed to help relieve symptoms (pain medicine, decongestants, nasal steroid sprays, or saline sprays).  However, for sinusitis related to a bacterial infection, your health care provider will prescribe antibiotic medicines. These are medicines that will help kill the bacteria causing the infection.  Rarely, sinusitis is caused by a fungal infection. In theses cases,  your health care provider will prescribe antifungal medicine. For some cases of chronic sinusitis, surgery is needed. Generally, these are cases in which sinusitis recurs more than 3 times per year, despite other treatments. HOME CARE INSTRUCTIONS   Drink plenty of water. Water helps thin the mucus so your sinuses can drain more easily.  Use a humidifier.  Inhale steam 3 to 4 times a day (for example, sit in the bathroom with the shower running).  Apply a warm, moist washcloth to your face 3 to 4 times a day, or as directed by your health care provider.  Use saline nasal sprays to help moisten and clean your sinuses.  Take medicines only as directed by your health care provider.  If you were prescribed either an antibiotic or antifungal medicine, finish it all  even if you start to feel better. SEEK IMMEDIATE MEDICAL CARE IF:  You have increasing pain or severe headaches.  You have nausea, vomiting, or drowsiness.  You have swelling around your face.  You have vision problems.  You have a stiff neck.  You have difficulty breathing. MAKE SURE YOU:   Understand these instructions.  Will watch your condition.  Will get help right away if you are not doing well or get worse. Document Released: 01/06/2005 Document Revised: 05/23/2013 Document Reviewed: 01/21/2011 [facility] Patient Information 2015 [place], [place]. This information is not intended to replace advice given to you by your health care provider. Make sure you discuss any questions you have with your health care provider.     I personally performed the services described in this documentation, which was scribed in my presence. The recorded information has been reviewed and considered, and addended by me as needed.

## 2014-10-28 ENCOUNTER — Other Ambulatory Visit: Payer: Self-pay | Admitting: Internal Medicine

## 2014-10-28 ENCOUNTER — Other Ambulatory Visit: Payer: Self-pay | Admitting: Gynecology

## 2014-11-20 ENCOUNTER — Other Ambulatory Visit: Payer: Self-pay | Admitting: Internal Medicine

## 2014-11-27 ENCOUNTER — Other Ambulatory Visit: Payer: Self-pay | Admitting: Internal Medicine

## 2014-12-03 ENCOUNTER — Other Ambulatory Visit: Payer: Self-pay | Admitting: Internal Medicine

## 2014-12-18 ENCOUNTER — Encounter: Payer: Self-pay | Admitting: Internal Medicine

## 2015-02-01 ENCOUNTER — Other Ambulatory Visit: Payer: Self-pay | Admitting: Physician Assistant

## 2015-02-26 ENCOUNTER — Other Ambulatory Visit: Payer: Self-pay | Admitting: Internal Medicine

## 2015-02-28 ENCOUNTER — Encounter: Payer: Self-pay | Admitting: Internal Medicine

## 2015-02-28 ENCOUNTER — Ambulatory Visit (INDEPENDENT_AMBULATORY_CARE_PROVIDER_SITE_OTHER): Payer: BLUE CROSS/BLUE SHIELD | Admitting: Internal Medicine

## 2015-02-28 VITALS — BP 175/103 | HR 91 | Temp 98.1°F | Resp 18 | Ht 66.0 in | Wt 184.0 lb

## 2015-02-28 DIAGNOSIS — C73 Malignant neoplasm of thyroid gland: Secondary | ICD-10-CM

## 2015-02-28 DIAGNOSIS — E890 Postprocedural hypothyroidism: Secondary | ICD-10-CM

## 2015-02-28 DIAGNOSIS — I10 Essential (primary) hypertension: Secondary | ICD-10-CM

## 2015-02-28 DIAGNOSIS — E892 Postprocedural hypoparathyroidism: Secondary | ICD-10-CM

## 2015-02-28 DIAGNOSIS — F329 Major depressive disorder, single episode, unspecified: Secondary | ICD-10-CM

## 2015-02-28 DIAGNOSIS — J4520 Mild intermittent asthma, uncomplicated: Secondary | ICD-10-CM

## 2015-03-02 ENCOUNTER — Other Ambulatory Visit: Payer: Self-pay | Admitting: Internal Medicine

## 2015-03-02 DIAGNOSIS — M5010 Cervical disc disorder with radiculopathy, unspecified cervical region: Secondary | ICD-10-CM

## 2015-03-02 MED ORDER — TRIAMTERENE-HCTZ 37.5-25 MG PO CAPS: 1.0000 | ORAL_CAPSULE | Freq: Every day | ORAL | Status: DC

## 2015-03-02 MED ORDER — VERAPAMIL HCL ER 360 MG PO CP24: 360.0000 mg | ORAL_CAPSULE | Freq: Every day | ORAL | Status: DC

## 2015-03-02 MED ORDER — VENLAFAXINE HCL ER 150 MG PO CP24: ORAL_CAPSULE | ORAL | Status: DC

## 2015-03-02 MED ORDER — AMOXICILLIN-POT CLAVULANATE 875-125 MG PO TABS: 1.0000 | ORAL_TABLET | Freq: Two times a day (BID) | ORAL | Status: DC

## 2015-03-02 MED ORDER — LEVOTHYROXINE SODIUM 112 MCG PO TABS: 112.0000 ug | ORAL_TABLET | Freq: Every day | ORAL | Status: DC

## 2015-03-02 NOTE — Progress Notes (Signed)
Angry at waiting to be seen and needs to leave for counseling appt Now working in [place] and wants to transfer care there Very stressed as in past Patient Active Problem List   Diagnosis Date Noted  . Thyroid cancer ([name]) 05/13/2011    Priority: Medium  . Hypothyroidism, postsurgical 05/13/2011    Priority: Medium  . Hypertension 12/25/2010    Priority: Medium  . Cervical disc disorder with radiculopathy of cervical region 10/08/2013  . 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 Menopause 06/17/2011  . Allergic rhinitis 05/13/2011  . Reactive airway disease 05/13/2011  . Depressed 05/13/2011  . Migraine 05/13/2011   Outpatient Encounter Prescriptions as of 02/28/2015  Medication Sig Note  . acyclovir (ZOVIRAX) 200 MG capsule TAKE 2 CAPSULES BY MOUTH 2 TIMES DAILY.   Marland Kitchen albuterol (VENTOLIN HFA) 108 (90 BASE) MCG/ACT inhaler INHALE 2 PUFFS INTO THE LUNGS EVERY 4 (FOUR) HOURS AS NEEDED FOR WHEEZING.   . beclomethasone (QVAR) 40 MCG/ACT inhaler INHALE 1 PUFF INTO THE LUNGS 2 (TWO) TIMES DAILY.   . calcitRIOL (ROCALTROL) 0.25 MCG capsule TAKE 2 CAPSULES (0.5 MCG TOTAL) BY MOUTH DAILY.   . clonazePAM (KLONOPIN) 1 MG tablet Take 1 tablet (1 mg total) by mouth 3 (three) times daily as needed.   . clotrimazole-betamethasone (LOTRISONE) cream Apply topically 2 (two) times daily.   . cyclobenzaprine (FLEXERIL) 10 MG tablet TAKE 1 TABLET (10 MG TOTAL) BY MOUTH 3 (THREE) TIMES DAILY AS NEEDED FOR MUSCLE SPASMS.   Marland Kitchen estradiol (ESTRACE) 1 MG tablet Take 1 tablet (1 mg total) by mouth daily.   . fluconazole (DIFLUCAN) 150 MG tablet Take 1 tablet (150 mg total) by mouth once.   Marland Kitchen levothyroxine (SYNTHROID, LEVOTHROID) 175 MCG tablet TAKE 1 TABLET (175 MCG TOTAL) BY MOUTH DAILY.   . metroNIDAZOLE (METROGEL) 0.75 % vaginal gel Place 1 Applicatorful vaginally 2 (two) times daily. Leave on file and may request monthly if needed for the next 5-6 months  10/10/2012: Needs refill  . montelukast (SINGULAIR) 10 MG tablet TAKE 1 TABLET (10 MG TOTAL) BY MOUTH AT BEDTIME.   Marland Kitchen oxyCODONE-acetaminophen (PERCOCET) 10-325 MG per tablet Take 1 tablet by mouth every 6 (six) hours as needed for pain.   . progesterone (PROMETRIUM) 200 MG capsule Take one tablet daily for 12 days of the month   . rizatriptan (MAXALT) 10 MG tablet TAKE 1 TABLET BY MOUTH AS NEEDED FOR MIGRAINE. MAY REPEAT IN 2 HOURS IF NEEDED   . traMADol (ULTRAM) 50 MG tablet Take 1-2 tablets (50-100 mg total) by mouth every 6 (six) hours as needed.   . triamterene-hydrochlorothiazide (DYAZIDE) 37.5-25 MG capsule Take 1 each (1 capsule total) by mouth daily.   Marland Kitchen venlafaxine XR (EFFEXOR-XR) 150 MG 24 hr capsule TAKE 1 CAPSULE (150 MG TOTAL) BY MOUTH DAILY WITH BREAKFAST.   "NO MORE REFILLS WITHOUT OFFICE VISIT"   . verapamil (VERELAN PM) 360 MG 24 hr capsule Take 1 capsule (360 mg total) by mouth at bedtime.   . [DISCONTINUED] amoxicillin-clavulanate (AUGMENTIN) 875-125 MG per tablet Take 1 tablet by mouth 2 (two) times daily.    No facility-administered encounter medications on file as of 02/28/2015.  needs treatment sinusitis again see 8/16 Meds ordered this encounter  Medications  . verapamil (VERELAN PM) 360 MG 24 hr capsule    Sig: Take 1 capsule (360 mg total) by mouth at bedtime.    Dispense:  90 capsule    Refill:  3  . venlafaxine  XR (EFFEXOR-XR) 150 MG 24 hr capsule    Sig: TAKE 1 CAPSULE (150 MG TOTAL) BY MOUTH DAILY WITH BREAKFAST.   "NO MORE REFILLS WITHOUT OFFICE VISIT"    Dispense:  90 capsule    Refill:  0  . levothyroxine (SYNTHROID, LEVOTHROID) 112 MCG tablet    Sig: Take 1 tablet (112 mcg total) by mouth daily before breakfast.    Dispense:  90 tablet    Refill:  1  . amoxicillin-clavulanate (AUGMENTIN) 875-125 MG tablet    Sig: Take 1 tablet by mouth 2 (two) times daily.    Dispense:  20 tablet    Refill:  0  . triamterene-hydrochlorothiazide (DYAZIDE) 37.5-25 MG  capsule    Sig: Take 1 each (1 capsule total) by mouth daily.    Dispense:  90 capsule    Refill:  1   Ref psych in [place] Dr Barbie Banner Ref PCP [facility] [id number] Drs Hart/Perrott or partners

## 2015-03-07 MED ORDER — OXYCODONE-ACETAMINOPHEN 10-325 MG PO TABS: 1.0000 | ORAL_TABLET | Freq: Four times a day (QID) | ORAL | Status: DC | PRN

## 2015-03-07 MED ORDER — VENLAFAXINE HCL ER 150 MG PO CP24: ORAL_CAPSULE | ORAL | Status: DC

## 2015-03-07 NOTE — Addendum Note (Signed)
Addended by: Leandrew Koyanagi on: 03/07/2015 09:20 AM   Modules accepted: Orders

## 2015-03-07 NOTE — Telephone Encounter (Signed)
Notified pt Rx is ready on mychart.

## 2015-03-27 ENCOUNTER — Encounter: Payer: Self-pay | Admitting: Internal Medicine

## 2015-03-27 ENCOUNTER — Telehealth: Payer: Self-pay | Admitting: *Deleted

## 2015-03-27 DIAGNOSIS — E890 Postprocedural hypothyroidism: Secondary | ICD-10-CM

## 2015-03-27 DIAGNOSIS — C73 Malignant neoplasm of thyroid gland: Secondary | ICD-10-CM

## 2015-03-27 NOTE — Telephone Encounter (Signed)
Pt informed with the below note. 

## 2015-03-27 NOTE — Telephone Encounter (Signed)
No not really, best to ask friends or coworkers for referrals that would be close.  Good luck

## 2015-03-27 NOTE — Telephone Encounter (Signed)
(  JF patient ) pt asked if you know if a MD in [place] area, she lives there now. Please advise

## 2015-03-28 ENCOUNTER — Encounter: Payer: BLUE CROSS/BLUE SHIELD | Admitting: Gynecology

## 2015-04-04 ENCOUNTER — Encounter: Payer: BLUE CROSS/BLUE SHIELD | Admitting: Women's Health

## 2015-04-11 ENCOUNTER — Other Ambulatory Visit: Payer: Self-pay | Admitting: Internal Medicine

## 2015-04-13 ENCOUNTER — Ambulatory Visit: Payer: BLUE CROSS/BLUE SHIELD | Attending: Otolaryngology

## 2015-04-13 DIAGNOSIS — F5101 Primary insomnia: Secondary | ICD-10-CM | POA: Insufficient documentation

## 2015-04-13 DIAGNOSIS — G4733 Obstructive sleep apnea (adult) (pediatric): Secondary | ICD-10-CM | POA: Insufficient documentation

## 2015-04-25 ENCOUNTER — Ambulatory Visit: Payer: BLUE CROSS/BLUE SHIELD | Admitting: Internal Medicine

## 2015-05-30 ENCOUNTER — Other Ambulatory Visit: Payer: Self-pay | Admitting: Internal Medicine

## 2015-07-09 ENCOUNTER — Other Ambulatory Visit: Payer: Self-pay | Admitting: Internal Medicine

## 2015-07-09 DIAGNOSIS — M5010 Cervical disc disorder with radiculopathy, unspecified cervical region: Secondary | ICD-10-CM

## 2015-07-10 MED ORDER — OXYCODONE-ACETAMINOPHEN 10-325 MG PO TABS: 1.0000 | ORAL_TABLET | Freq: Four times a day (QID) | ORAL | Status: DC | PRN

## 2015-07-10 NOTE — Addendum Note (Signed)
Addended by: Leandrew Koyanagi on: 07/10/2015 08:25 AM   Modules accepted: Orders

## 2015-07-10 NOTE — Telephone Encounter (Signed)
Dr Laney Pastor, it looks like pt is also requesting Rx for tramadol. I will copy/paste pt's req from below which was easy to miss:      I also need a re-fill on the tramadol. Ortho has suggested trying to manage pain in right side of neck from pinched nerve without surgery as long as possible and exercise does help as well.   Please advise.

## 2015-07-26 ENCOUNTER — Ambulatory Visit: Payer: BLUE CROSS/BLUE SHIELD | Attending: Otolaryngology

## 2015-07-26 DIAGNOSIS — F5101 Primary insomnia: Secondary | ICD-10-CM | POA: Insufficient documentation

## 2015-07-26 DIAGNOSIS — G4733 Obstructive sleep apnea (adult) (pediatric): Secondary | ICD-10-CM | POA: Insufficient documentation

## 2015-08-01 ENCOUNTER — Other Ambulatory Visit: Payer: Self-pay | Admitting: Internal Medicine

## 2015-08-29 ENCOUNTER — Other Ambulatory Visit: Payer: Self-pay

## 2015-08-29 MED ORDER — VENLAFAXINE HCL ER 150 MG PO CP24
ORAL_CAPSULE | ORAL | 0 refills | Status: DC
Start: 2015-08-29 — End: 2016-05-19

## 2015-09-18 ENCOUNTER — Ambulatory Visit (INDEPENDENT_AMBULATORY_CARE_PROVIDER_SITE_OTHER): Payer: BLUE CROSS/BLUE SHIELD | Admitting: Physician Assistant

## 2015-09-18 VITALS — BP 152/98 | HR 85 | Temp 98.1°F | Resp 17 | Ht 66.0 in | Wt 188.0 lb

## 2015-09-18 DIAGNOSIS — J029 Acute pharyngitis, unspecified: Secondary | ICD-10-CM

## 2015-09-18 DIAGNOSIS — R05 Cough: Secondary | ICD-10-CM | POA: Diagnosis not present

## 2015-09-18 DIAGNOSIS — B370 Candidal stomatitis: Secondary | ICD-10-CM

## 2015-09-18 DIAGNOSIS — R059 Cough, unspecified: Secondary | ICD-10-CM

## 2015-09-18 LAB — POCT RAPID STREP A (OFFICE): RAPID STREP A SCREEN: NEGATIVE

## 2015-09-18 LAB — POCT SKIN KOH: SKIN KOH, POC: POSITIVE

## 2015-09-18 MED ORDER — DOXYCYCLINE HYCLATE 100 MG PO CAPS: 100.0000 mg | ORAL_CAPSULE | Freq: Two times a day (BID) | ORAL | 0 refills | Status: DC

## 2015-09-18 MED ORDER — BENZONATATE 100 MG PO CAPS: 100.0000 mg | ORAL_CAPSULE | Freq: Three times a day (TID) | ORAL | 0 refills | Status: DC | PRN

## 2015-09-18 MED ORDER — CLOTRIMAZOLE 10 MG MT TROC
10.0000 mg | Freq: Every day | OROMUCOSAL | 0 refills | Status: AC
Start: 2015-09-18 — End: 2015-09-28

## 2015-09-18 MED ORDER — PROMETHAZINE-DM 6.25-15 MG/5ML PO SYRP: 5.0000 mL | ORAL_SOLUTION | Freq: Four times a day (QID) | ORAL | 0 refills | Status: DC | PRN

## 2015-09-18 NOTE — Patient Instructions (Addendum)
-  Take antibiotic as prescribed -Use mycelex troches five times a day for the ten days you are on antibiotics and then up to an additional ten days after you complete antibiotic course. You can stop taking the troches after symptoms resolve. -Use tessalon perles up to three times a day for cough and cough medicine at night as needed for cough and sleep. -Do not use zyrtec-d or mucinex-d, use plain zyrtec or mucinex -Please come back if you are not any better in 10 days -Seek care sooner if you develop worsening of symptoms     IF you received an x-ray today, you will receive an invoice from [facility]. Please contact [facility] at [phone] with questions or concerns regarding your invoice.   IF you received labwork today, you will receive an invoice from [employer]. Please contact [facility] at [phone] with questions or concerns regarding your invoice.   Our billing staff will not be able to assist you with questions regarding bills from these companies.  You will be contacted with the lab results as soon as they are available. The fastest way to get your results is to activate your My Chart account. Instructions are located on the last page of this paperwork. If you have not heard from [place] regarding the results in 2 weeks, please contact this office.     We recommend that you schedule a mammogram for breast cancer screening. Typically, you do not need a referral to do this. Please contact a local imaging center to schedule your mammogram.  [facility] - [phone]  *ask for the Radiology Department [facility] ([place]) - [phone] or [phone]  [facility] - [phone] [place] [phone] [facility] - [phone]  *ask for the [facility] - [phone]  *ask for the Radiology Department [facility] -  [phone]  *ask for the [place] - [phone]

## 2015-09-18 NOTE — Progress Notes (Signed)
MAGNOLIA KOWITZ  MRN: [id number] DOB: [birth year]  Subjective:  [name] is a 55 y.o. female seen in office today for a chief complaint of illness x 3 weeks. Started with a head cold and then it moved to her chest. Has associated pain with coughing, dyspnea on exertion, pleuritic chest pain, non productive cough that progressed to a productive cough last night,  and shortness of breath. Denies any recent sick exposure that she is aware of. Has tried mucinex consistently for five days (helped at first with symptoms but now not working), zyrtec-d, qvar, albuterol inhaler, and hot air humidifier for two weeks with no relief. Has a history of thyroid cancer in 2009, tx with radiation and chemotherapy. Has a history of pneumonia. Last episode was 2014.   Review of Systems  Constitutional: Positive for fever (101.0 last night, took advil today). Negative for diaphoresis.  HENT: Positive for ear pain and sinus pressure. Negative for sneezing.   Respiratory: Positive for chest tightness and wheezing.   Cardiovascular: Negative for palpitations and leg swelling.  Gastrointestinal: Negative for abdominal pain, nausea and vomiting.    Patient Active Problem List   Diagnosis Date Noted  . Cervical disc disorder with radiculopathy of cervical region 10/08/2013  . 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 Menopause 06/17/2011  . Allergic rhinitis 05/13/2011  . Reactive airway disease 05/13/2011  . Depressed 05/13/2011  . Migraine 05/13/2011  . Thyroid cancer ([place]) 05/13/2011  . Hypothyroidism, postsurgical 05/13/2011  . Hypertension 12/25/2010    Current Outpatient Prescriptions on File Prior to Visit  Medication Sig Dispense Refill  . acyclovir (ZOVIRAX) 200 MG capsule TAKE 2 CAPSULES BY MOUTH 2 TIMES DAILY. 120 capsule 4  . albuterol (VENTOLIN HFA) 108 (90 BASE) MCG/ACT inhaler INHALE 2 PUFFS INTO THE LUNGS EVERY 4 (FOUR) HOURS  AS NEEDED FOR WHEEZING. 18 g 3  . calcitRIOL (ROCALTROL) 0.25 MCG capsule TAKE 2 CAPSULES (0.5 MCG TOTAL) BY MOUTH DAILY. 180 capsule 3  . clonazePAM (KLONOPIN) 1 MG tablet Take 1 tablet (1 mg total) by mouth 3 (three) times daily as needed. 90 tablet 5  . clotrimazole-betamethasone (LOTRISONE) cream Apply topically 2 (two) times daily. 30 g 0  . cyclobenzaprine (FLEXERIL) 10 MG tablet TAKE 1 TABLET (10 MG TOTAL) BY MOUTH 3 (THREE) TIMES DAILY AS NEEDED FOR MUSCLE SPASMS. 30 tablet 2  . estradiol (ESTRACE) 1 MG tablet Take 1 tablet (1 mg total) by mouth daily. 90 tablet 4  . fluconazole (DIFLUCAN) 150 MG tablet Take 1 tablet (150 mg total) by mouth once. 1 tablet 0  . levothyroxine (SYNTHROID, LEVOTHROID) 112 MCG tablet Take 1 tablet (112 mcg total) by mouth daily before breakfast. 90 tablet 1  . metroNIDAZOLE (METROGEL) 0.75 % vaginal gel Place 1 Applicatorful vaginally 2 (two) times daily. Leave on file and may request monthly if needed for the next 5-6 months 70 g 5  . montelukast (SINGULAIR) 10 MG tablet TAKE 1 TABLET (10 MG TOTAL) BY MOUTH AT BEDTIME. 30 tablet 0  . oxyCODONE-acetaminophen (PERCOCET) 10-325 MG tablet Take 1 tablet by mouth every 6 (six) hours as needed for pain. 30 tablet 0  . progesterone (PROMETRIUM) 200 MG capsule Take one tablet daily for 12 days of the month 40 capsule 4  . QVAR 40 MCG/ACT inhaler INHALE 1 PUFF INTO THE LUNGS 2 (TWO) TIMES DAILY. 3 Inhaler 0  . rizatriptan (MAXALT) 10 MG tablet TAKE 1 TABLET BY MOUTH AS NEEDED FOR  MIGRAINE. MAY REPEAT IN 2 HOURS IF NEEDED 12 tablet 3  . traMADol (ULTRAM) 50 MG tablet Take 1-2 tablets (50-100 mg total) by mouth every 6 (six) hours as needed. 240 tablet 2  . triamterene-hydrochlorothiazide (DYAZIDE) 37.5-25 MG capsule Take 1 each (1 capsule total) by mouth daily. 90 capsule 1  . venlafaxine XR (EFFEXOR-XR) 150 MG 24 hr capsule TAKE 1 CAPSULE (150 MG TOTAL) BY MOUTH DAILY WITH BREAKFAST 30 capsule 0  . VENTOLIN HFA 108 (90  Base) MCG/ACT inhaler INHALE 2 PUFFS INTO THE LUNGS EVERY 4 (FOUR) HOURS AS NEEDED FOR WHEEZING. 18 Inhaler 2  . verapamil (VERELAN PM) 360 MG 24 hr capsule Take 1 capsule (360 mg total) by mouth at bedtime. 90 capsule 3   No current facility-administered medications on file prior to visit.    Past Medical History:  Diagnosis Date  . Allergy   . Asthma   . Depression   . Deviated septum   . Herpes   . HTN (hypertension)   . Hypothyroid   . Insomnia   . Neuromuscular disorder ([place])   . Thyroid cancer ([place]) 2009   Past Surgical History:  Procedure Laterality Date  . CESAREAN SECTION    . CHOLECYSTECTOMY  03/26/2006  . DILATION AND CURETTAGE OF UTERUS  06/30/2000  . THYROIDECTOMY     PARATHYROIDECTOMY   Allergies  Allergen Reactions  . Codeine   . Lisinopril    Social History   Social History  . Marital status: Married    Spouse name: N/A  . Number of children: 1  . Years of education: N/A   Occupational History  .      Teach elementary school   Social History Main Topics  . Smoking status: Former Smoker    Packs/day: 2.00    Years: 20.00    Quit date: 05/13/1990  . Smokeless tobacco: Never Used  . Alcohol use No     Comment: Rarely  . Drug use: No  . Sexual activity: Yes   Other Topics Concern  . Not on file   Social History Narrative    She is a Radio producer.  Has an 30-year-old child.  Does    not smoke cigarettes or drink alcohol.  She is married.          Objective:  BP (!) 152/98 (BP Location: Right Arm, Patient Position: Sitting, Cuff Size: Large)   Pulse 85   Temp 98.1 F (36.7 C) (Oral)   Resp 17   Ht 5\' 6"  (1.676 m)   Wt 188 lb (85.3 kg)   LMP 06/17/2002   SpO2 97%   BMI 30.34 kg/m   Physical Exam  Constitutional: She is oriented to person, place, and time.  Patient is a well developed female, appearing uncomfortable lying on exam table   HENT:  Head: Normocephalic and atraumatic.  Right Ear: Tympanic membrane, external ear and ear  canal normal.  Left Ear: Tympanic membrane, external ear and ear canal normal.  Nose: Mucosal edema (more prominent in left nostril) present.  Mouth/Throat: Posterior oropharyngeal erythema present.  Creamy white lesions noted in posterior oropharyngeal region   Eyes: Conjunctivae are normal.  Neck: Normal range of motion.  Cardiovascular: Normal rate, regular rhythm and normal heart sounds.   Pulmonary/Chest: Effort normal and breath sounds normal. She has no wheezes. She has no rhonchi. She has no rales.  Lymphadenopathy:       Head (right side): No submental, no submandibular, no tonsillar, no preauricular, no  posterior auricular and no occipital adenopathy present.       Head (left side): No submental, no submandibular, no tonsillar, no preauricular, no posterior auricular and no occipital adenopathy present.    She has no cervical adenopathy.       Right: No supraclavicular adenopathy present.       Left: No supraclavicular adenopathy present.  Neurological: She is alert and oriented to person, place, and time. Gait normal.  Skin: Skin is warm and dry.  Psychiatric: Affect normal.  Vitals reviewed.   Results for orders placed or performed in visit on 09/18/15 (from the past 24 hour(s))  POCT rapid strep A     Status: None   Collection Time: 09/18/15  5:50 PM  Result Value Ref Range   Rapid Strep A Screen Negative Negative  POCT Skin KOH     Status: None   Collection Time: 09/18/15  5:50 PM  Result Value Ref Range   Skin KOH, POC Positive     Assessment and Plan :  1. Sore throat - POCT rapid strep A - POCT Skin KOH  2. Cough -Due to length of symptoms and patient's history, will cover for atypical organisms of bronchitis  - doxycycline (VIBRAMYCIN) 100 MG capsule; Take 1 capsule (100 mg total) by mouth 2 (two) times daily.  Dispense: 20 capsule; Refill: 0 - benzonatate (TESSALON) 100 MG capsule; Take 1-2 capsules (100-200 mg total) by mouth 3 (three) times daily as needed  for cough.  Dispense: 40 capsule; Refill: 0 - promethazine-dextromethorphan (PROMETHAZINE-DM) 6.25-15 MG/5ML syrup; Take 5 mLs by mouth 4 (four) times daily as needed for cough.  Dispense: 118 mL; Refill: 0  3. Oral thrush - clotrimazole (MYCELEX) 10 MG troche; Take 1 tablet (10 mg total) by mouth 5 (five) times daily. For the ten days you are on an antibiotic and then up to an additional ten days after you complete your antibiotic course.  Dispense: 140 tablet; Refill: 0 -Pt to contact me if no improvement after 20 days with this treatment for a prescription of oral diflucan     Tenna Delaine PA-C  [facility] 09/18/2015 7:38 PM

## 2015-09-20 ENCOUNTER — Other Ambulatory Visit: Payer: Self-pay

## 2015-09-20 MED ORDER — TRIAMTERENE-HCTZ 37.5-25 MG PO CAPS: 1.0000 | ORAL_CAPSULE | Freq: Every day | ORAL | 0 refills | Status: DC

## 2015-09-22 ENCOUNTER — Other Ambulatory Visit: Payer: Self-pay | Admitting: Urgent Care

## 2015-10-03 ENCOUNTER — Other Ambulatory Visit: Payer: Self-pay

## 2015-10-03 MED ORDER — TRIAMTERENE-HCTZ 37.5-25 MG PO CAPS: 1.0000 | ORAL_CAPSULE | Freq: Every day | ORAL | 0 refills | Status: DC

## 2015-10-03 MED ORDER — MONTELUKAST SODIUM 10 MG PO TABS: ORAL_TABLET | ORAL | 0 refills | Status: DC

## 2015-10-29 ENCOUNTER — Other Ambulatory Visit: Payer: Self-pay | Admitting: Internal Medicine

## 2015-10-29 ENCOUNTER — Other Ambulatory Visit: Payer: Self-pay | Admitting: Urgent Care

## 2015-10-29 DIAGNOSIS — M5010 Cervical disc disorder with radiculopathy, unspecified cervical region: Secondary | ICD-10-CM

## 2015-11-06 NOTE — Telephone Encounter (Signed)
I sent a message to pt on req for oxycodone and will wait for her replay.

## 2015-11-12 ENCOUNTER — Other Ambulatory Visit: Payer: Self-pay

## 2015-11-12 MED ORDER — TRIAMTERENE-HCTZ 37.5-25 MG PO CAPS: 1.0000 | ORAL_CAPSULE | Freq: Every day | ORAL | 0 refills | Status: DC

## 2015-11-20 ENCOUNTER — Ambulatory Visit: Payer: BLUE CROSS/BLUE SHIELD

## 2015-12-08 ENCOUNTER — Other Ambulatory Visit: Payer: Self-pay | Admitting: Urgent Care

## 2016-03-07 ENCOUNTER — Telehealth: Payer: Self-pay | Admitting: *Deleted

## 2016-03-07 ENCOUNTER — Ambulatory Visit (INDEPENDENT_AMBULATORY_CARE_PROVIDER_SITE_OTHER): Payer: BLUE CROSS/BLUE SHIELD | Admitting: Physician Assistant

## 2016-03-07 VITALS — BP 156/102 | HR 80 | Temp 98.2°F | Ht 66.0 in | Wt 192.4 lb

## 2016-03-07 DIAGNOSIS — H60503 Unspecified acute noninfective otitis externa, bilateral: Secondary | ICD-10-CM | POA: Diagnosis not present

## 2016-03-07 DIAGNOSIS — J0100 Acute maxillary sinusitis, unspecified: Secondary | ICD-10-CM | POA: Diagnosis not present

## 2016-03-07 DIAGNOSIS — H578 Other specified disorders of eye and adnexa: Secondary | ICD-10-CM | POA: Diagnosis not present

## 2016-03-07 DIAGNOSIS — R030 Elevated blood-pressure reading, without diagnosis of hypertension: Secondary | ICD-10-CM

## 2016-03-07 DIAGNOSIS — R6889 Other general symptoms and signs: Secondary | ICD-10-CM

## 2016-03-07 MED ORDER — CIPROFLOXACIN-DEXAMETHASONE 0.3-0.1 % OT SUSP: 4.0000 [drp] | Freq: Two times a day (BID) | OTIC | 0 refills | Status: DC

## 2016-03-07 MED ORDER — AMOXICILLIN-POT CLAVULANATE 875-125 MG PO TABS: 1.0000 | ORAL_TABLET | Freq: Two times a day (BID) | ORAL | 0 refills | Status: AC

## 2016-03-07 NOTE — Telephone Encounter (Signed)
Called [facility] pharmacy spoke to [place] (pharmacist) change Augmentin take for 10 days, per [employer].

## 2016-03-07 NOTE — Patient Instructions (Addendum)
To use the ear drops: -Lie down or tilt your head with your ear facing upward. Open the ear canal by gently pulling your ear back, or pulling downward on the earlobe when giving this medicine to a child. -Hold the dropper upside down over your ear and drop the correct number of drops into the ear. -Stay lying down or with your head tilted for at least 5 minutes. You may use a small piece of cotton to plug the ear and keep the medicine from draining out. -Do not touch the dropper tip or place it directly in your ear. It may become contaminated. Wipe the tip with a clean tissue but do not wash with water or soap. -Use this medicine for the full prescribed length of time. Your symptoms may improve before the infection is completely cleared. Skipping doses may also increase your risk of further infection that is resistant to antibiotics.   Go buy OTC opcon A for your eyes.  Use sinus rinse for sinuses. If you are still having issues after you finish your antibiotic call our clinic as you may need prednisone.    Sinusitis, Adult Sinusitis is soreness and inflammation of your sinuses. Sinuses are hollow spaces in the bones around your face. They are located:  Around your eyes.  In the middle of your forehead.  Behind your nose.  In your cheekbones. Your sinuses and nasal passages are lined with a stringy fluid (mucus). Mucus normally drains out of your sinuses. When your nasal tissues get inflamed or swollen, the mucus can get trapped or blocked so air cannot flow through your sinuses. This lets bacteria, viruses, and funguses grow, and that leads to infection. Follow these instructions at home: Medicines  Take, use, or apply over-the-counter and prescription medicines only as told by your doctor. These may include nasal sprays.  If you were prescribed an antibiotic medicine, take it as told by your doctor. Do not stop taking the antibiotic even if you start to feel better. Hydrate and  Humidify  Drink enough water to keep your pee (urine) clear or pale yellow.  Use a cool mist humidifier to keep the humidity level in your home above 50%.  Breathe in steam for 10-15 minutes, 3-4 times a day or as told by your doctor. You can do this in the bathroom while a hot shower is running.  Try not to spend time in cool or dry air. Rest  Rest as much as possible.  Sleep with your head raised (elevated).  Make sure to get enough sleep each night. General instructions  Put a warm, moist washcloth on your face 3-4 times a day or as told by your doctor. This will help with discomfort.  Wash your hands often with soap and water. If there is no soap and water, use hand sanitizer.  Do not smoke. Avoid being around people who are smoking (secondhand smoke).  Keep all follow-up visits as told by your doctor. This is important. Contact a doctor if:  You have a fever.  Your symptoms get worse.  Your symptoms do not get better within 10 days. Get help right away if:  You have a very bad headache.  You cannot stop throwing up (vomiting).  You have pain or swelling around your face or eyes.  You have trouble seeing.  You feel confused.  Your neck is stiff.  You have trouble breathing. This information is not intended to replace advice given to you by your health care provider.  Make sure you discuss any questions you have with your health care provider. Document Released: 06/25/2007 Document Revised: 09/02/2015 Document Reviewed: 11/01/2014 Elsevier Interactive Patient Education  2017 [employer].    IF you received an x-ray today, you will receive an invoice from [facility]. Please contact [facility] at [phone] with questions or concerns regarding your invoice.   IF you received labwork today, you will receive an invoice from [facility]. Please contact [facility] at [phone] with questions or concerns regarding your invoice.   Our billing  staff will not be able to assist you with questions regarding bills from these companies.  You will be contacted with the lab results as soon as they are available. The fastest way to get your results is to activate your My Chart account. Instructions are located on the last page of this paperwork. If you have not heard from [place] regarding the results in 2 weeks, please contact this office.

## 2016-03-07 NOTE — Progress Notes (Signed)
MRN: [id number] DOB: [birth year]  Subjective:   [name] is a 56 y.o. female presenting for chief complaint of Ear Drainage (X 3 weeks) and Sinus Problem (X 3 weeks) .  Reports 3 week history of ear pain (worse on left side), sinus pressure and congestion, and low grade fever. She then developed bilateral itchy watery red eyes x 1 week ago. Marland Kitchen Has not tried anything for relief. Was trying a nose spray but it has been irritating her so she stopped. Denies wheezing, shortness of breath, chest tightness and myalgia, chills, nausea, vomiting, abdominal pain and diarrhea. Has not had sick contact with. Has pretty severe history of seasonal allergies and a history of asthma.  No smoking.   She has not taken her bp medication this morning.    Irvin has a current medication list which includes the following prescription(s): acyclovir, benzonatate, calcitriol, clonazepam, clotrimazole-betamethasone, cyclobenzaprine, doxycycline, estradiol, fluconazole, levothyroxine, metronidazole, montelukast, oxycodone-acetaminophen, progesterone, promethazine-dextromethorphan, qvar, rizatriptan, tramadol, triamterene-hydrochlorothiazide, venlafaxine xr, ventolin hfa, verapamil, albuterol, amoxicillin-clavulanate, and ciprofloxacin-dexamethasone. Also is allergic to lisinopril.  Jessicka  has a past medical history of Allergy; Asthma; Depression; Deviated septum; Herpes; HTN (hypertension); Hypothyroid; Insomnia; Neuromuscular disorder ([place]); and Thyroid cancer ([place]) (2009). Also  has a past surgical history that includes Cesarean section; Thyroidectomy; Dilation and curettage of uterus (06/30/2000); and Cholecystectomy (03/26/2006).   Objective:   Vitals: BP (!) 156/102 (BP Location: Right Arm, Patient Position: Sitting, Cuff Size: Large)   Pulse 80   Temp 98.2 F (36.8 C) (Oral)   Ht 5\' 6"  (1.676 m)   Wt 192 lb 6.4 oz (87.3 kg)   LMP 06/17/2002   SpO2 98%   BMI 31.05 kg/m   Physical Exam    Constitutional: She is oriented to person, place, and time. She appears well-developed and well-nourished.  HENT:  Head: Normocephalic and atraumatic.  Right Ear: There is drainage and swelling (of ear canal). Tympanic membrane is not perforated.  Left Ear: There is drainage, swelling (moderate of ear canal, erythema present) and tenderness (with palpation of tragus). Tympanic membrane is not perforated.  Nose: Mucosal edema (prominent bilaterally) present. Right sinus exhibits maxillary sinus tenderness (prominent). Right sinus exhibits no frontal sinus tenderness. Left sinus exhibits maxillary sinus tenderness (prominent). Left sinus exhibits no frontal sinus tenderness.  Mouth/Throat: Uvula is midline, oropharynx is clear and moist and mucous membranes are normal.  Eyes: Right conjunctiva is injected (watery). Left conjunctiva is injected (watery).  Neck: Normal range of motion.  Pulmonary/Chest: Effort normal and breath sounds normal.  Neurological: She is alert and oriented to person, place, and time.  Skin: Skin is warm and dry.  Psychiatric: She has a normal mood and affect.  Vitals reviewed.  BP Readings from Last 3 Encounters:  03/07/16 (!) 156/102  09/18/15 (!) 152/98  02/28/15 (!) 175/103   No results found for this or any previous visit (from the past 24 hour(s)).  Assessment and Plan :  1. Acute maxillary sinusitis, recurrence not specified -Pt instructed to continue nasal saline rinse and flonase as tolerated. If still having pressure and inflammation after 10 days of antibiotic, pt instructed to call our office as she may need a course of prednisone at that time.  - amoxicillin-clavulanate (AUGMENTIN) 875-125 MG tablet; Take 1 tablet by mouth 2 (two) times daily.  Dispense: 20 tablet; Refill: 0  2. Acute otitis externa of both ears, unspecified type -Instructed to call our office if Rx is too expensive as there are other options  that are more affordable but require more  frequent dosing.  - ciprofloxacin-dexamethasone (CIPRODEX) otic suspension; Place 4 drops into both ears 2 (two) times daily.  Dispense: 7.5 mL; Refill: 0  3. Itchy eyes Likely allergic conjunctivitis, instructed to go buy OTC opcon A, if no improvement or she develops purulent drainage from eyes, return to clinic.   4. Elevated blood pressure reading Instructed to go home and take bp medication.Follow up with PCP for bp management.   Tenna Delaine, PA-C  [facility] 03/07/2016 2:04 PM

## 2016-03-12 DIAGNOSIS — S93422A Sprain of deltoid ligament of left ankle, initial encounter: Secondary | ICD-10-CM

## 2016-03-14 ENCOUNTER — Telehealth: Payer: Self-pay

## 2016-03-14 ENCOUNTER — Other Ambulatory Visit: Payer: Self-pay | Admitting: Physician Assistant

## 2016-03-14 MED ORDER — PREDNISONE 20 MG PO TABS: ORAL_TABLET | ORAL | 0 refills | Status: DC

## 2016-03-14 NOTE — Telephone Encounter (Signed)
Tried calling patient to see if I could help her but no answer. Will forward to PA-[place].

## 2016-03-14 NOTE — Progress Notes (Signed)
Pt contacted via phone. She has been taking augmentin for sinusitis but notes she still has lots of inflammation and is still pretty tender upon palpation of left maxillary sinus. I will prescribe prednisone at this time. Pt instructed to RTC if no improvement with prednisone course.   Meds ordered this encounter  Medications  . predniSONE (DELTASONE) 20 MG tablet    Sig: Days 1-5: Take 40mg  in the am with food. Days 6-10: Take 20mg  in the am with food    Dispense:  15 tablet    Refill:  0    Order Specific Question:   Supervising Provider    Answer:   Wardell Honour [[id number]]

## 2016-03-14 NOTE — Telephone Encounter (Signed)
Pt saw Tenna Delaine recently and would like a call from her at [phone]. Pt stated she would know what this is in regard to.

## 2016-03-15 ENCOUNTER — Telehealth: Payer: Self-pay | Admitting: Internal Medicine

## 2016-03-15 DIAGNOSIS — M5010 Cervical disc disorder with radiculopathy, unspecified cervical region: Secondary | ICD-10-CM

## 2016-03-17 ENCOUNTER — Encounter: Payer: Self-pay | Admitting: Family Medicine

## 2016-03-17 ENCOUNTER — Ambulatory Visit (INDEPENDENT_AMBULATORY_CARE_PROVIDER_SITE_OTHER): Payer: BLUE CROSS/BLUE SHIELD | Admitting: Family Medicine

## 2016-03-17 VITALS — BP 167/95 | HR 98 | Temp 98.7°F | Resp 18 | Ht 66.0 in | Wt 191.6 lb

## 2016-03-17 DIAGNOSIS — R5383 Other fatigue: Secondary | ICD-10-CM | POA: Diagnosis not present

## 2016-03-17 DIAGNOSIS — M5010 Cervical disc disorder with radiculopathy, unspecified cervical region: Secondary | ICD-10-CM | POA: Diagnosis not present

## 2016-03-17 DIAGNOSIS — I10 Essential (primary) hypertension: Secondary | ICD-10-CM

## 2016-03-17 DIAGNOSIS — E890 Postprocedural hypothyroidism: Secondary | ICD-10-CM | POA: Diagnosis not present

## 2016-03-17 DIAGNOSIS — E892 Postprocedural hypoparathyroidism: Secondary | ICD-10-CM | POA: Diagnosis not present

## 2016-03-17 MED ORDER — RIZATRIPTAN BENZOATE 10 MG PO TABS: 10.0000 mg | ORAL_TABLET | ORAL | 11 refills | Status: DC | PRN

## 2016-03-17 MED ORDER — VERAPAMIL HCL ER 360 MG PO CP24: 360.0000 mg | ORAL_CAPSULE | Freq: Every day | ORAL | 3 refills | Status: DC

## 2016-03-17 MED ORDER — OXYCODONE-ACETAMINOPHEN 10-325 MG PO TABS: 1.0000 | ORAL_TABLET | Freq: Four times a day (QID) | ORAL | 0 refills | Status: DC | PRN

## 2016-03-17 MED ORDER — ACYCLOVIR 200 MG PO CAPS: ORAL_CAPSULE | ORAL | 3 refills | Status: DC

## 2016-03-17 MED ORDER — TRIAMTERENE-HCTZ 37.5-25 MG PO CAPS: 1.0000 | ORAL_CAPSULE | Freq: Every day | ORAL | 1 refills | Status: DC

## 2016-03-17 MED ORDER — BECLOMETHASONE DIPROPIONATE 40 MCG/ACT IN AERS: 1.0000 | INHALATION_SPRAY | Freq: Two times a day (BID) | RESPIRATORY_TRACT | 2 refills | Status: DC | PRN

## 2016-03-17 MED ORDER — MONTELUKAST SODIUM 10 MG PO TABS: ORAL_TABLET | ORAL | 3 refills | Status: DC

## 2016-03-17 MED ORDER — LOSARTAN POTASSIUM 25 MG PO TABS: 25.0000 mg | ORAL_TABLET | Freq: Every day | ORAL | 1 refills | Status: DC

## 2016-03-17 MED ORDER — CALCITRIOL 0.25 MCG PO CAPS: ORAL_CAPSULE | ORAL | 1 refills | Status: DC

## 2016-03-17 NOTE — Progress Notes (Signed)
Subjective:    Patient ID: [name], female    DOB: Aug 22, 1960, 56 y.o.   MRN: [id number] Chief Complaint  Patient presents with  . Establish Care    HPI  Ms. [name] is a 56 yo woman who is here to follow-up on her chronic medical problems.  She has had difficult time finding a new provider since my prior colleague Dr [name] retired and has tried to establish w/ several other physicians (in other clinics) but has had difficulty finding a provider with who she connects.   Takes a daily mvi.  HTN: low salt diet. Uncontrolled. Forgot to take her verapamil today. But does have a med box. Does have a BP cuff at home and usu runs in the 140s. Lisinopril caused severe cramping.  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  She is on zoloft by [name]. Effoxor caused severe nausea and illness if she even missed one dose.  Obesity:  Starting by walking 30 minutes a day.  She has lost 45 lbs by walking every day.  Migraines: Takes maxalt prn about once a month, triggered by stress.  Usu frontal HAs with photophobia/phonophobia/ and nausea.  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 Seasonal allergies: Uses ventolin prn for flairs.  Pro-air doesn't work well for her. Stays on claritin with singulair Panic attacks: rare, <1/mo. She takes a klonopin when she has a panic attack.  She still has plenty at home and does not need a refills. Does not like to take it as it affects her short term memory. Short-term memory loss which she noted after complete thyroidectomy. Her whole family has noticed the change. Has not changed since it developed around 2010. She has not seen neurology and does not know much about her family hx. She is  currently in a MSW program which has made some complications.   She [facility]. Cassie [facility] at [name] and they did lots of testing and tried to level her thyroid and she did not notice any difference.  On the 112 of levothyroxine mcg. They did try her on armor 90mg  but she was not able to find it. She was on some Nature's supp as well.  Not sure if she was tried on brand name. She can't remember.  Has been on estradiol 1mg  from Dr. Toney Morgan but feels like it is pointless.  She sees Dr. Jacelyn [facility] at [name] - her next appt is 3/13 - she wants a refill of the oxycodone. States she gets 2-3 refills a year.  She has a pinched nerve for a right c-spine down to hand. The tramadol does not work as well.  When she is having the cortisone injection.  Very rare would take 2 in a day - usually just 1 every couple of days  She does not get flu shots.  She reports it gets her ill.  Past Medical History:  Diagnosis Date  . Allergy   . Asthma   . Depression   . Deviated septum   . Herpes   . HTN (hypertension)   . Hypothyroid   . Insomnia   . Neuromuscular disorder ([place])   . Thyroid cancer ([place]) 2009   Past Surgical History:  Procedure  Laterality Date  . CESAREAN SECTION    . CHOLECYSTECTOMY  03/26/2006  . DILATION AND CURETTAGE OF UTERUS  06/30/2000  . THYROIDECTOMY     PARATHYROIDECTOMY   Current Outpatient Prescriptions on File Prior to Visit  Medication Sig Dispense Refill  . acyclovir (ZOVIRAX) 200 MG capsule TAKE 2 CAPSULES BY MOUTH 2 TIMES DAILY. 120 capsule 4  . calcitRIOL (ROCALTROL) 0.25 MCG capsule TAKE 2 CAPSULES (0.5 MCG TOTAL) BY MOUTH DAILY. 180 capsule 3  . clonazePAM (KLONOPIN) 1 MG tablet Take 1 tablet (1 mg total) by mouth 3 (three) times daily as needed. 90 tablet 5  . cyclobenzaprine (FLEXERIL) 10 MG tablet TAKE 1 TABLET (10 MG TOTAL) BY MOUTH 3 (THREE) TIMES DAILY AS NEEDED FOR MUSCLE SPASMS. 30 tablet 2  . estradiol (ESTRACE) 1 MG tablet Take 1 tablet (1 mg  total) by mouth daily. 90 tablet 4  . levothyroxine (SYNTHROID, LEVOTHROID) 112 MCG tablet Take 1 tablet (112 mcg total) by mouth daily before breakfast. 90 tablet 1  . montelukast (SINGULAIR) 10 MG tablet TAKE 1 TABLET (10 MG TOTAL) BY MOUTH AT BEDTIME. 30 tablet 0  . oxyCODONE-acetaminophen (PERCOCET) 10-325 MG tablet Take 1 tablet by mouth every 6 (six) hours as needed for pain. 30 tablet 0  . predniSONE (DELTASONE) 20 MG tablet Days 1-5: Take 40mg  in the am with food. Days 6-10: Take 20mg  in the am with food 15 tablet 0  . progesterone (PROMETRIUM) 200 MG capsule Take one tablet daily for 12 days of the month 40 capsule 4  . promethazine-dextromethorphan (PROMETHAZINE-DM) 6.25-15 MG/5ML syrup Take 5 mLs by mouth 4 (four) times daily as needed for cough. 118 mL 0  . QVAR 40 MCG/ACT inhaler INHALE 1 PUFF INTO THE LUNGS 2 (TWO) TIMES DAILY. 3 Inhaler 0  . rizatriptan (MAXALT) 10 MG tablet TAKE 1 TABLET BY MOUTH AS NEEDED FOR MIGRAINE. MAY REPEAT IN 2 HOURS IF NEEDED 12 tablet 3  . traMADol (ULTRAM) 50 MG tablet Take 1-2 tablets (50-100 mg total) by mouth every 6 (six) hours as needed. 240 tablet 2  . triamterene-hydrochlorothiazide (DYAZIDE) 37.5-25 MG capsule Take 1 each (1 capsule total) by mouth daily. 15 capsule 0  . venlafaxine XR (EFFEXOR-XR) 150 MG 24 hr capsule TAKE 1 CAPSULE (150 MG TOTAL) BY MOUTH DAILY WITH BREAKFAST 30 capsule 0  . VENTOLIN HFA 108 (90 Base) MCG/ACT inhaler INHALE 2 PUFFS INTO THE LUNGS EVERY 4 (FOUR) HOURS AS NEEDED FOR WHEEZING. 18 Inhaler 2  . verapamil (VERELAN PM) 360 MG 24 hr capsule Take 1 capsule (360 mg total) by mouth at bedtime. 90 capsule 3   No current facility-administered medications on file prior to visit.    Allergies  Allergen Reactions  . Lisinopril    Family History  Problem Relation Age of Onset  . Stacey disease Father     CABG in his 61s  . Hyperlipidemia Father   . Stacey disease Brother     CABG  . Stacey disease Sister     Stents in  68s  . Hyperlipidemia Sister   . Stacey disease Sister     CABG in 58s  . Hyperlipidemia Sister   . Hyperlipidemia Mother   . Breast cancer Maternal Aunt   . Hyperlipidemia Maternal Uncle   . Hyperlipidemia Maternal Grandmother   . Breast cancer Maternal Grandmother   . Hyperlipidemia Paternal Grandmother   . Breast cancer Paternal Grandmother    Social History   Social History  . Marital  status: Married    Spouse name: N/A  . Number of children: 1  . Years of education: N/A   Occupational History  .      Teach elementary school   Social History Main Topics  . Smoking status: Former Smoker    Packs/day: 2.00    Years: 20.00    Quit date: 05/13/1990  . Smokeless tobacco: Never Used  . Alcohol use No     Comment: Rarely  . Drug use: No  . Sexual activity: Yes   Other Topics Concern  . None   Social History Narrative    She is a Radio producer.  Has an 42-year-old child.  Does    not smoke cigarettes or drink alcohol.  She is married.         Depression screen [facility] 2/9 03/17/2016 03/07/2016 09/18/2015 02/28/2015 09/03/2014  Decreased Interest 0 0 0 0 0  Down, Depressed, Hopeless 0 0 0 0 0  PHQ - 2 Score 0 0 0 0 0     Review of Systems See hpi    Objective:   Physical Exam  Constitutional: She is oriented to person, place, and time. She appears well-developed and well-nourished. No distress.  HENT:  Head: Normocephalic and atraumatic.  Right Ear: External ear normal.  Left Ear: External ear normal.  Eyes: Conjunctivae are normal. No scleral icterus.  Neck: Normal range of motion. Neck supple. No thyromegaly present.  Cardiovascular: Normal rate, regular rhythm, normal Stacey sounds and intact distal pulses.   Pulmonary/Chest: Effort normal and breath sounds normal. No respiratory distress.  Musculoskeletal: She exhibits no edema.  Lymphadenopathy:    She has no cervical adenopathy.  Neurological: She is alert and oriented to person, place, and time.  Skin: Skin  is warm and dry. She is not diaphoretic. No erythema.  Psychiatric: She has a normal mood and affect. Her behavior is normal.      BP (!) 167/95   Pulse 98   Temp 98.7 F (37.1 C) (Oral)   Resp 18   Ht 5\' 6"  (1.676 m)   Wt 191 lb 9.6 oz (86.9 kg)   LMP 06/17/2002   SpO2 96%   BMI 30.93 kg/m      Assessment & Plan:   1. 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 Cervical disc disorder with radiculopathy of cervical region   3. Essential hypertension   4. Postoperative hypothyroidism   5. Fatigue, unspecified type     Orders Placed This Encounter  Procedures  . Comprehensive metabolic panel    Standing Status:   Future    Standing Expiration Date:   03/17/2017    Order Specific Question:   Has the patient fasted?    Answer:   Yes  . Lipid panel    Standing Status:   Future    Standing Expiration Date:   03/17/2017    Order Specific Question:   Has the patient fasted?    Answer:   Yes  . CBC    Standing Status:   Future    Standing Expiration Date:   03/17/2017  . VITAMIN D 25 Hydroxy (Vit-D Deficiency, Fractures)    Standing Status:   Future    Standing Expiration Date:   03/17/2017  . Phosphorus    Standing Status:   Future    Standing Expiration Date:   03/17/2017  . Thyroid Panel With TSH    Standing Status:   Future    Standing Expiration Date:   03/17/2017  Meds ordered this encounter  Medications  . acyclovir (ZOVIRAX) 200 MG capsule    Sig: TAKE 2 CAPSULES BY MOUTH 2 TIMES DAILY.    Dispense:  360 capsule    Refill:  3  . beclomethasone (QVAR) 40 MCG/ACT inhaler    Sig: Inhale 1 puff into the lungs 2 (two) times daily as needed.    Dispense:  1 Inhaler    Refill:  2  . calcitRIOL (ROCALTROL) 0.25 MCG capsule    Sig: TAKE 2 CAPSULES (0.5 MCG TOTAL) BY MOUTH DAILY.    Dispense:  180 capsule    Refill:  1  . montelukast (SINGULAIR) 10 MG tablet    Sig: TAKE 1 TABLET (10 MG TOTAL) BY MOUTH AT BEDTIME.    Dispense:  90 tablet    Refill:  3  .  oxyCODONE-acetaminophen (PERCOCET) 10-325 MG tablet    Sig: Take 1 tablet by mouth every 6 (six) hours as needed for pain.    Dispense:  30 tablet    Refill:  0  . rizatriptan (MAXALT) 10 MG tablet    Sig: Take 1 tablet (10 mg total) by mouth as needed for migraine. May repeat in 2 hours if needed    Dispense:  12 tablet    Refill:  11  . DISCONTD: verapamil (VERELAN PM) 360 MG 24 hr capsule    Sig: Take 1 capsule (360 mg total) by mouth at bedtime.    Dispense:  90 capsule    Refill:  3  . triamterene-hydrochlorothiazide (DYAZIDE) 37.5-25 MG capsule    Sig: Take 1 each (1 capsule total) by mouth daily.    Dispense:  90 capsule    Refill:  1  . losartan (COZAAR) 25 MG tablet    Sig: Take 1 tablet (25 mg total) by mouth daily.    Dispense:  30 tablet    Refill:  1   Over 40 min spent in face-to-face evaluation of and consultation with patient and coordination of care.  Over 50% of this time was spent counseling this patient.  Delman Cheadle, M.D.  Primary Care at [facility] [address] [place], [place] [address] [phone] phone [phone] fax  04/18/16 1:55 AM

## 2016-03-17 NOTE — Patient Instructions (Addendum)
We recommend that you schedule a mammogram for breast cancer screening. Typically, you do not need a referral to do this. Please contact a local imaging center to schedule your mammogram.  [facility] - [phone]  *ask for the Radiology Department [facility] ([place]) - [phone] or [phone]  [facility] - [phone] [place] [phone] [facility] - [phone]  *ask for the [facility] - [phone]  *ask for the Radiology Department [facility] - [phone]  *ask for the [facility] - [phone]      IF you received an x-ray today, you will receive an invoice from [facility]. Please contact [facility] at [phone] with questions or concerns regarding your invoice.   IF you received labwork today, you will receive an invoice from [facility]. Please contact [facility] at [phone] with questions or concerns regarding your invoice.   Our billing staff will not be able to assist you with questions regarding bills from these companies.  You will be contacted with the lab results as soon as they are available. The fastest way to get your results is to activate your My Chart account. Instructions are located on the last page of this paperwork. If you have not heard from [place] regarding the results in 2 weeks, please contact this office.     Calcium Intake Recommendations Introduction Calcium is a mineral that affects many functions in the body, including:  Blood clotting.  Blood vessel function.  Nerve impulse conduction.  Hormone secretion.  Muscle contraction.  Bone and teeth functions. Most of your body's calcium supply is stored in your bones and teeth. When your calcium stores are low, you may be at risk for low bone mass, bone loss, and bone fractures. Consuming enough calcium helps  to grow healthy bones and teeth and to prevent breakdown over time. It is very important that you get enough calcium if you are:  A child undergoing rapid growth.  An adolescent girl.  A pre- or post-menopausal woman.  A woman whose menstrual cycle has stopped due to anorexia nervosa or regular intense exercise.  An individual with lactose intolerance or a milk allergy.  A vegetarian. What is my plan? Try to consume the recommended amount of calcium daily based on your age. Depending on your overall health, your health care provider may recommend increased calcium intake.General daily calcium intake recommendations by age are:  Birth to 6 months: 200 mg.  Infants 7 to 12 months: 260 mg.  Children 1 to 3 years: 700 mg.  Children 4 to 8 years: 1,000 mg.  Children 9 to 13 years: 1,300 mg.  Teens 14 to 18 years: 1,300 mg.  Adults 19 to 50 years: 1,000 mg.  Adult women 51 to 70 years: 1,200 mg.  Adult men 51 to 70 years: 1,000 mg.  Adults 71 years and older: 1,200 mg.  Pregnant and breastfeeding teens: 1,300 mg.  Pregnant and breastfeeding adults: 1,000 mg. What do I need to know about calcium intake?  In order for the body to absorb calcium, it needs vitamin D. You can get vitamin D through:  Direct exposure of the skin to sunlight.  Foods, such as egg yolks, liver, saltwater fish, and fortified milk.  Supplements.  Consuming too much calcium may cause:  Constipation.  Decreased absorption of iron and zinc.  Kidney stones.  Calcium supplements may interact with certain medicines.  Check with your health care provider before starting any calcium supplements.  Try to get most of your calcium from food. What foods can I eat? Grains  Fortified oatmeal. Fortified ready-to-eat cereals. Fortified frozen waffles. Vegetables  Turnip greens. Broccoli. Fruits  Fortified orange juice. Meats and Other Protein Sources  Canned sardines with bones. Canned salmon with  bones. Soy beans. Tofu. Baked beans. Almonds. [place] nuts. Sunflower seeds. Dairy  Milk. Yogurt. Cheese. Cottage cheese. Beverages  Fortified soy milk. Fortified rice milk. Sweets/Desserts  Pudding. Ice Cream. Milkshakes. Blackstrap molasses. The items listed above may not be a complete list of recommended foods or beverages. Contact your dietitian for more options.  What foods can affect my calcium intake? It may be more difficult for your body to use calcium or calcium may leave your body more quickly if you consume large amounts of:  Sodium.  Protein.  Caffeine.  Alcohol. This information is not intended to replace advice given to you by your health care provider. Make sure you discuss any questions you have with your health care provider. Document Released: 08/21/2003 Document Revised: 07/27/2015 Document Reviewed: 06/14/2013  2017 Elsevier

## 2016-03-20 ENCOUNTER — Telehealth: Payer: Self-pay | Admitting: Family Medicine

## 2016-03-20 MED ORDER — VERAPAMIL HCL ER 180 MG PO TBCR: 360.0000 mg | EXTENDED_RELEASE_TABLET | Freq: Every day | ORAL | 3 refills | Status: DC

## 2016-03-20 NOTE — Telephone Encounter (Signed)
Generic is no longer available in 360mg  verapamil Brand is expensive. Need to adjust dose or pick another med.

## 2016-03-20 NOTE — Telephone Encounter (Signed)
[facility] called stating that the [place] om will be going off market soon so this is unavailable to order and we need to change this for the patient   Best number [phone]

## 2016-03-20 NOTE — Telephone Encounter (Signed)
Changed to 2 tabs of 180mg 

## 2016-05-19 ENCOUNTER — Telehealth: Payer: Self-pay | Admitting: Family Medicine

## 2016-05-19 ENCOUNTER — Ambulatory Visit (INDEPENDENT_AMBULATORY_CARE_PROVIDER_SITE_OTHER): Payer: BLUE CROSS/BLUE SHIELD | Admitting: Family Medicine

## 2016-05-19 VITALS — BP 155/86 | HR 84 | Temp 97.6°F | Resp 16 | Ht 66.0 in | Wt 190.0 lb

## 2016-05-19 DIAGNOSIS — I10 Essential (primary) hypertension: Secondary | ICD-10-CM

## 2016-05-19 DIAGNOSIS — R3915 Urgency of urination: Secondary | ICD-10-CM

## 2016-05-19 DIAGNOSIS — E892 Postprocedural hypoparathyroidism: Secondary | ICD-10-CM

## 2016-05-19 DIAGNOSIS — Z5181 Encounter for therapeutic drug level monitoring: Secondary | ICD-10-CM

## 2016-05-19 DIAGNOSIS — N200 Calculus of kidney: Secondary | ICD-10-CM

## 2016-05-19 DIAGNOSIS — R5383 Other fatigue: Secondary | ICD-10-CM

## 2016-05-19 DIAGNOSIS — E890 Postprocedural hypothyroidism: Secondary | ICD-10-CM

## 2016-05-19 LAB — POCT URINALYSIS DIP (MANUAL ENTRY)
Bilirubin, UA: NEGATIVE
Glucose, UA: NEGATIVE mg/dL
Ketones, POC UA: NEGATIVE mg/dL
Nitrite, UA: NEGATIVE
PH UA: 7 (ref 5.0–8.0)
PROTEIN UA: NEGATIVE mg/dL
Spec Grav, UA: 1.01 (ref 1.010–1.025)
UROBILINOGEN UA: 0.2 U/dL

## 2016-05-19 LAB — POCT CBC
Granulocyte percent: 62.3 %G (ref 37–80)
HCT, POC: 44.8 % (ref 37.7–47.9)
HEMOGLOBIN: 15.8 g/dL (ref 12.2–16.2)
LYMPH, POC: 1.7 (ref 0.6–3.4)
MCH, POC: 29.8 pg (ref 27–31.2)
MCHC: 35.4 g/dL (ref 31.8–35.4)
MCV: 84 fL (ref 80–97)
MID (cbc): 0.5 (ref 0–0.9)
MPV: 8.5 fL (ref 0–99.8)
PLATELET COUNT, POC: 190 10*3/uL (ref 142–424)
POC GRANULOCYTE: 3.6 (ref 2–6.9)
POC LYMPH PERCENT: 29 %L (ref 10–50)
POC MID %: 8.7 %M (ref 0–12)
RBC: 5.33 M/uL (ref 4.04–5.48)
RDW, POC: 13.5 %
WBC: 5.8 10*3/uL (ref 4.6–10.2)

## 2016-05-19 LAB — POC MICROSCOPIC URINALYSIS (UMFC): Mucus: ABSENT

## 2016-05-19 MED ORDER — TAMSULOSIN HCL 0.4 MG PO CAPS: 0.4000 mg | ORAL_CAPSULE | Freq: Every day | ORAL | 3 refills | Status: DC

## 2016-05-19 MED ORDER — CIPROFLOXACIN HCL 500 MG PO TABS: 500.0000 mg | ORAL_TABLET | Freq: Two times a day (BID) | ORAL | 0 refills | Status: DC

## 2016-05-19 MED ORDER — LOSARTAN POTASSIUM 100 MG PO TABS
100.0000 mg | ORAL_TABLET | Freq: Every day | ORAL | 1 refills | Status: DC
Start: 2016-05-19 — End: 2016-11-19

## 2016-05-19 NOTE — Telephone Encounter (Signed)
Dr Brigitte Pulse would like to get this patients lab results from [facility], she stated there was a release on file for it already but I did not see one. Double check behind me please.

## 2016-05-19 NOTE — Progress Notes (Signed)
Subjective:    Patient ID: [name], female    DOB: [birth year], 56 y.o.   MRN: [id number] Chief Complaint  Patient presents with  . Back Pain    x 2 days  . Hematuria    x 2 days    HPI  Has had 1 week of lower back pain and hematuria started yesterday. Her left low back hurts more than right worsened by certain positions but no other exacerbating factors. She has been taking some advil and has been trying hard not to use her percocet. She has been taking the ibuprofen 400mg  1-2x/d for the past several days. She has been pushing fluids - water. Her urine turned pink yesterday.  Her back pain has not changed in location or quality.  She had kidney stones last in Oct 2017.  She does feel like she is running a low grade fever with chills in the past days but no n/v/abd pain.  Bowels have been normal for her. Having decreased urine with no frequency or hesitancy. No dysuria.  Her current insurance has a $10K deductible so it is all out of pocket until then.  Chronic Medical Conditions HTN: low salt diet. Uncontrolled. Does have a med box. Does have a BP cuff at home and usu runs in the 140s. Lisinopril caused severe cramping.   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 She saw Dr. [facility] at [facility] at [place] and they did lots of testing and tried to level her thyroid and she did not notice any difference.  On the 112 of levothyroxine mcg. They did try her on armor 90mg  but she was not able to find it. She was on some Nature's supp as well.  Not sure if she was tried on brand name Synthroid. She can't remember all of what she tried but feels like it was everything. Has gone to support groups for people who have survived thyroid cancer but it was more depressing as everyone was on disability and not doing real well.  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 Her whole family has noticed the change. Has not changed since it developed around 2010. She has not seen neurology and does not know much about her family hx. She is currently in a MSW program which has made some complications.  Depression: followed at [facility] in [place].  She is on zoloft by [facility]. Effoxor caused severe nausea and illness if she even missed one dose.  Panic attacks: rare, <1/mo. She takes a klonopin when she has a panic attack. Does not like to take it as it affects her short term memory. Obesity:  Starting by walking 30 minutes a day.  She has lost 45 lbs by walking every day.  Migraines: Takes maxalt prn about once a month, triggered by stress.  Usu frontal HAs with photophobia/phonophobia/ and nausea.  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 Seasonal allergies: Uses ventolin prn for flairs.  Pro-air doesn't work well for her. Stays on claritin with singulair Post-menopausal: Has been on estradiol 1mg  from Dr. Toney Rakes but feels like it is pointless. 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 Followed by Dr. Jacelyn Grip at [facility] [place] for occasional cortisone injections. Uses rare prn oxycodone which is only refilled 2-3x a  year. Very rare for her to take 2 in a day - usually just 1 every couple of days. The tramadol does not work as well.   Past Medical History:  Diagnosis Date  . Allergy   . Asthma   . Depression   . Deviated septum   . Herpes   . HTN (hypertension)   . Hypothyroid   . Insomnia   . Neuromuscular disorder ([place])   . Thyroid cancer ([place]) 2009   Past Surgical History:  Procedure Laterality Date  . CESAREAN SECTION    . CHOLECYSTECTOMY  03/26/2006  . DILATION AND CURETTAGE OF UTERUS  06/30/2000  . THYROIDECTOMY     PARATHYROIDECTOMY   Current Outpatient Prescriptions on File Prior to Visit  Medication  Sig Dispense Refill  . acyclovir (ZOVIRAX) 200 MG capsule TAKE 2 CAPSULES BY MOUTH 2 TIMES DAILY. 360 capsule 3  . beclomethasone (QVAR) 40 MCG/ACT inhaler Inhale 1 puff into the lungs 2 (two) times daily as needed. 1 Inhaler 2  . calcitRIOL (ROCALTROL) 0.25 MCG capsule TAKE 2 CAPSULES (0.5 MCG TOTAL) BY MOUTH DAILY. 180 capsule 1  . clonazePAM (KLONOPIN) 1 MG tablet Take 1 tablet (1 mg total) by mouth 3 (three) times daily as needed. 90 tablet 5  . cyclobenzaprine (FLEXERIL) 10 MG tablet TAKE 1 TABLET (10 MG TOTAL) BY MOUTH 3 (THREE) TIMES DAILY AS NEEDED FOR MUSCLE SPASMS. 30 tablet 2  . estradiol (ESTRACE) 1 MG tablet Take 1 tablet (1 mg total) by mouth daily. 90 tablet 4  . levothyroxine (SYNTHROID, LEVOTHROID) 112 MCG tablet Take 1 tablet (112 mcg total) by mouth daily before breakfast. 90 tablet 1  . montelukast (SINGULAIR) 10 MG tablet TAKE 1 TABLET (10 MG TOTAL) BY MOUTH AT BEDTIME. 90 tablet 3  . oxyCODONE-acetaminophen (PERCOCET) 10-325 MG tablet Take 1 tablet by mouth every 6 (six) hours as needed for pain. 30 tablet 0  . progesterone (PROMETRIUM) 200 MG capsule Take one tablet daily for 12 days of the month 40 capsule 4  . rizatriptan (MAXALT) 10 MG tablet Take 1 tablet (10 mg total) by mouth as needed for migraine. May repeat in 2 hours if needed 12 tablet 11  . traMADol (ULTRAM) 50 MG tablet Take 1-2 tablets (50-100 mg total) by mouth every 6 (six) hours as needed. 240 tablet 2  . triamterene-hydrochlorothiazide (DYAZIDE) 37.5-25 MG capsule Take 1 each (1 capsule total) by mouth daily. 90 capsule 1  . VENTOLIN HFA 108 (90 Base) MCG/ACT inhaler INHALE 2 PUFFS INTO THE LUNGS EVERY 4 (FOUR) HOURS AS NEEDED FOR WHEEZING. 18 Inhaler 2  . verapamil (CALAN-SR) 180 MG CR tablet Take 2 tablets (360 mg total) by mouth at bedtime. 180 tablet 3   No current facility-administered medications on file prior to visit.    Allergies  Allergen Reactions  . Lisinopril    Family History  Problem  Relation Age of Onset  . Heart disease Father     CABG in his 63s  . Hyperlipidemia Father   . Heart disease Brother     CABG  . Heart disease Sister     Stents in 47s  . Hyperlipidemia Sister   . Heart disease Sister     CABG in 36s  . Hyperlipidemia Sister   . Hyperlipidemia Mother   . Breast cancer Maternal Aunt   . Hyperlipidemia Maternal Uncle   . Hyperlipidemia Maternal Grandmother   . Breast cancer Maternal Grandmother   . Hyperlipidemia Paternal Grandmother   .  Breast cancer Paternal Grandmother    Social History   Social History  . Marital status: Married    Spouse name: N/A  . Number of children: 1  . Years of education: N/A   Occupational History  .      Teach elementary school   Social History Main Topics  . Smoking status: Former Smoker    Packs/day: 2.00    Years: 20.00    Quit date: 05/13/1990  . Smokeless tobacco: Never Used  . Alcohol use No     Comment: Rarely  . Drug use: No  . Sexual activity: Yes   Other Topics Concern  . Not on file   Social History Narrative    She is a Radio producer.  Has an 31-year-old child.  Does    not smoke cigarettes or drink alcohol.  She is married.         Depression screen [employer] 2/9 05/19/2016 03/17/2016 03/07/2016 09/18/2015 02/28/2015  Decreased Interest 0 0 0 0 0  Down, Depressed, Hopeless 0 0 0 0 0  PHQ - 2 Score 0 0 0 0 0     Review of Systems See hpi    Objective:   Physical Exam  Constitutional: She is oriented to person, place, and time. She appears well-developed and well-nourished. No distress.  HENT:  Head: Normocephalic and atraumatic.  Neck: Normal range of motion. Neck supple. No thyromegaly present.  Cardiovascular: Normal rate, regular rhythm, normal heart sounds and intact distal pulses.   Pulmonary/Chest: Effort normal and breath sounds normal. No respiratory distress.  Abdominal: Soft. Bowel sounds are normal. She exhibits no distension and no mass. There is no tenderness. There is no  rebound, no guarding and no CVA tenderness.  Musculoskeletal: She exhibits no edema.  Lymphadenopathy:    She has no cervical adenopathy.  Neurological: She is alert and oriented to person, place, and time.  Skin: Skin is warm and dry. She is not diaphoretic. No erythema.  Psychiatric: She has a normal mood and affect. Her behavior is normal.      BP (!) 155/86   Pulse 84   Temp 97.6 F (36.4 C) (Oral)   Resp 16   Ht 5\' 6"  (1.676 m)   Wt 190 lb (86.2 kg)   LMP 06/17/2002   SpO2 96%   BMI 30.67 kg/m  Results for orders placed or performed in visit on 05/19/16  POCT urinalysis dipstick  Result Value Ref Range   Color, UA yellow yellow   Clarity, UA clear clear   Glucose, UA negative negative mg/dL   Bilirubin, UA negative negative   Ketones, POC UA negative negative mg/dL   Spec Grav, UA 1.010 1.010 - 1.025   Blood, UA moderate (A) negative   pH, UA 7.0 5.0 - 8.0   Protein Ur, POC negative negative mg/dL   Urobilinogen, UA 0.2 0.2 or 1.0 E.U./dL   Nitrite, UA Negative Negative   Leukocytes, UA Small (1+) (A) Negative  POCT Microscopic Urinalysis ([facility])  Result Value Ref Range   WBC,UR,HPF,POC Moderate (A) None WBC/hpf   RBC,UR,HPF,POC Moderate (A) None RBC/hpf   Bacteria Many (A) None, Too numerous to count   Mucus Absent Absent   Epithelial Cells, UR Per Microscopy Moderate (A) None, Too numerous to count cells/hpf       Assessment & Plan:   1. Urinary urgency - suspect pt may have kidney stone as she has had prior but pain not overwhelming. Cover for UTI with cipro and  push fluids, start flomax  2. Hypoparathyroidism after procedure ([place])   3. Essential hypertension - uncontrolled. Increase losartan from 25 to 50 and check bp at home. If not at goal, increase to 100mg   4. Fatigue, unspecified type   5. Postoperative hypothyroidism - get prior labs from [facility]. Will be due in several years, check annually. Pt feels sxs poorly controlled though not responsive to  other changes. Consider trial of brand name only synthroid??  6. Medication monitoring encounter   Pt with high deductible insurance so cancelled all send out labs   Orders Placed This Encounter  Procedures  . Urine culture  . Comprehensive metabolic panel    Standing Status:   Future    Standing Expiration Date:   05/19/2017  . Thyroid Panel With TSH    Standing Status:   Future    Standing Expiration Date:   05/19/2017  . POCT urinalysis dipstick  . POCT Microscopic Urinalysis ([facility])  . POCT CBC    Meds ordered this encounter  Medications  . sertraline (ZOLOFT) 50 MG tablet    Sig: Take 50 mg by mouth daily.  . ciprofloxacin (CIPRO) 500 MG tablet    Sig: Take 1 tablet (500 mg total) by mouth 2 (two) times daily.    Dispense:  10 tablet    Refill:  0  . tamsulosin (FLOMAX) 0.4 MG CAPS capsule    Sig: Take 1 capsule (0.4 mg total) by mouth daily.    Dispense:  30 capsule    Refill:  3  . losartan (COZAAR) 100 MG tablet    Sig: Take 1 tablet (100 mg total) by mouth daily.    Dispense:  90 tablet    Refill:  1    Delman Cheadle, M.D.  Primary Care at [facility] [address] [place], [place] [address] [phone] phone [phone] fax  05/21/16 11:05 AM

## 2016-05-19 NOTE — Patient Instructions (Signed)
     IF you received an x-ray today, you will receive an invoice from [facility]. Please contact [facility] at [phone] with questions or concerns regarding your invoice.   IF you received labwork today, you will receive an invoice from [facility]. Please contact [facility] at [phone] with questions or concerns regarding your invoice.   Our billing staff will not be able to assist you with questions regarding bills from these companies.  You will be contacted with the lab results as soon as they are available. The fastest way to get your results is to activate your My Chart account. Instructions are located on the last page of this paperwork. If you have not heard from us regarding the results in 2 weeks, please contact this office.     

## 2016-05-20 LAB — URINE CULTURE

## 2016-05-23 LAB — CBC

## 2016-05-23 LAB — THYROID PANEL WITH TSH

## 2016-05-23 LAB — COMPREHENSIVE METABOLIC PANEL

## 2016-05-23 LAB — LIPID PANEL

## 2016-05-23 LAB — PHOSPHORUS

## 2016-05-23 LAB — VITAMIN D 25 HYDROXY (VIT D DEFICIENCY, FRACTURES)

## 2016-05-28 NOTE — Telephone Encounter (Signed)
I got a verbal from pt. I faxed verbal to [facility] and got a confirmation.

## 2016-06-04 ENCOUNTER — Encounter: Payer: Self-pay | Admitting: Gynecology

## 2016-08-20 ENCOUNTER — Encounter: Payer: Self-pay | Admitting: Family Medicine

## 2016-08-20 ENCOUNTER — Other Ambulatory Visit: Payer: Self-pay | Admitting: Family Medicine

## 2016-08-20 DIAGNOSIS — M5010 Cervical disc disorder with radiculopathy, unspecified cervical region: Secondary | ICD-10-CM

## 2016-08-24 NOTE — Telephone Encounter (Signed)
03/16/16 note: "She sees Dr. Jacelyn [facility]rip [facility] - her next appt is 3/13 - she wants a refill of the oxycodone. States she gets 2-3 refills a year.  She has a pinched nerve for a right c-spine down to hand. The tramadol does not work as well.  When she is having the cortisone injection.  Very rare would take 2 in a day - usually just 1 every couple of days"  Pt has a 10K high deductible insurance and so cannot afford to come in for visits.   [facility]SD shows only narcotic fill in the past 6 mos was the #30 tabs I wrote in 2/26 filled on 3/16. Also filled #60 adderall 20mg  from [facility] on the same day.

## 2016-08-25 MED ORDER — OXYCODONE-ACETAMINOPHEN 10-325 MG PO TABS: 1.0000 | ORAL_TABLET | Freq: Four times a day (QID) | ORAL | 0 refills | Status: DC | PRN

## 2016-08-25 NOTE — Telephone Encounter (Signed)
mychart message sent to pt about making an apt for more refills °

## 2016-08-25 NOTE — Telephone Encounter (Signed)
Please call patient to schedule an appointment. Patient is frustrated by unsuccessful attempts in trying to get through when she has called.

## 2016-10-18 ENCOUNTER — Other Ambulatory Visit: Payer: Self-pay | Admitting: Family Medicine

## 2016-11-03 ENCOUNTER — Ambulatory Visit: Payer: BLUE CROSS/BLUE SHIELD | Admitting: Family Medicine

## 2016-11-19 ENCOUNTER — Encounter: Payer: Self-pay | Admitting: Family Medicine

## 2016-11-19 ENCOUNTER — Ambulatory Visit (INDEPENDENT_AMBULATORY_CARE_PROVIDER_SITE_OTHER): Payer: BLUE CROSS/BLUE SHIELD | Admitting: Family Medicine

## 2016-11-19 VITALS — BP 146/88 | HR 78 | Temp 97.8°F | Resp 16 | Ht 65.0 in | Wt 188.0 lb

## 2016-11-19 DIAGNOSIS — R109 Unspecified abdominal pain: Secondary | ICD-10-CM | POA: Diagnosis not present

## 2016-11-19 DIAGNOSIS — R310 Gross hematuria: Secondary | ICD-10-CM

## 2016-11-19 DIAGNOSIS — I10 Essential (primary) hypertension: Secondary | ICD-10-CM | POA: Diagnosis not present

## 2016-11-19 DIAGNOSIS — N200 Calculus of kidney: Secondary | ICD-10-CM

## 2016-11-19 DIAGNOSIS — E892 Postprocedural hypoparathyroidism: Secondary | ICD-10-CM

## 2016-11-19 DIAGNOSIS — R350 Frequency of micturition: Secondary | ICD-10-CM | POA: Diagnosis not present

## 2016-11-19 DIAGNOSIS — M5010 Cervical disc disorder with radiculopathy, unspecified cervical region: Secondary | ICD-10-CM

## 2016-11-19 DIAGNOSIS — Z5181 Encounter for therapeutic drug level monitoring: Secondary | ICD-10-CM

## 2016-11-19 LAB — POC MICROSCOPIC URINALYSIS (UMFC): Mucus: ABSENT

## 2016-11-19 LAB — POCT URINALYSIS DIP (MANUAL ENTRY)
Blood, UA: NEGATIVE
Glucose, UA: NEGATIVE mg/dL
LEUKOCYTES UA: NEGATIVE
Nitrite, UA: NEGATIVE
PH UA: 6 (ref 5.0–8.0)
PROTEIN UA: NEGATIVE mg/dL
Urobilinogen, UA: 0.2 E.U./dL

## 2016-11-19 MED ORDER — TRIAMTERENE-HCTZ 37.5-25 MG PO CAPS: 1.0000 | ORAL_CAPSULE | Freq: Every day | ORAL | 1 refills | Status: DC

## 2016-11-19 MED ORDER — OXYCODONE-ACETAMINOPHEN 10-325 MG PO TABS: 1.0000 | ORAL_TABLET | Freq: Four times a day (QID) | ORAL | 0 refills | Status: DC | PRN

## 2016-11-19 MED ORDER — CALCITRIOL 0.25 MCG PO CAPS: ORAL_CAPSULE | ORAL | 1 refills | Status: DC

## 2016-11-19 MED ORDER — ALBUTEROL SULFATE HFA 108 (90 BASE) MCG/ACT IN AERS: INHALATION_SPRAY | RESPIRATORY_TRACT | 2 refills | Status: DC

## 2016-11-19 MED ORDER — BECLOMETHASONE DIPROPIONATE 40 MCG/ACT IN AERS
1.0000 | INHALATION_SPRAY | Freq: Two times a day (BID) | RESPIRATORY_TRACT | 2 refills | Status: DC | PRN
Start: 2016-11-19 — End: 2016-12-05

## 2016-11-19 MED ORDER — CANDESARTAN CILEXETIL 32 MG PO TABS: 32.0000 mg | ORAL_TABLET | Freq: Every day | ORAL | 1 refills | Status: DC

## 2016-11-19 NOTE — Patient Instructions (Addendum)
IF you received an x-ray today, you will receive an invoice from [facility]. Please contact [facility] at [phone] with questions or concerns regarding your invoice.   IF you received labwork today, you will receive an invoice from [facility]. Please contact [facility] at [phone] with questions or concerns regarding your invoice.   Our billing staff will not be able to assist you with questions regarding bills from these companies.  You will be contacted with the lab results as soon as they are available. The fastest way to get your results is to activate your My Chart account. Instructions are located on the last page of this paperwork. If you have not heard from [place] regarding the results in 2 weeks, please contact this office.      Hematuria, Adult Hematuria is blood in your urine. It can be caused by a bladder infection, kidney infection, prostate infection, kidney stone, or cancer of your urinary tract. Infections can usually be treated with medicine, and a kidney stone usually will pass through your urine. If neither of these is the cause of your hematuria, further workup to find out the reason may be needed. It is very important that you tell your health care provider about any blood you see in your urine, even if the blood stops without treatment or happens without causing pain. Blood in your urine that happens and then stops and then happens again can be a symptom of a very serious condition. Also, pain is not a symptom in the initial stages of many urinary cancers. Follow these instructions at home:  Drink lots of fluid, 3-4 quarts a day. If you have been diagnosed with an infection, cranberry juice is especially recommended, in addition to large amounts of water.  Avoid caffeine, tea, and carbonated beverages because they tend to irritate the bladder.  Avoid alcohol because it may irritate the prostate.  Take all medicines as directed by your health  care provider.  If you were prescribed an antibiotic medicine, finish it all even if you start to feel better.  If you have been diagnosed with a kidney stone, follow your health care provider's instructions regarding straining your urine to catch the stone.  Empty your bladder often. Avoid holding urine for long periods of time.  After a bowel movement, women should cleanse front to back. Use each tissue only once.  Empty your bladder before and after sexual intercourse if you are a female. Contact a health care provider if:  You develop back pain.  You have a fever.  You have a feeling of sickness in your stomach (nausea) or vomiting.  Your symptoms are not better in 3 days. Return sooner if you are getting worse. Get help right away if:  You develop severe vomiting and are unable to keep the medicine down.  You develop severe back or abdominal pain despite taking your medicines.  You begin passing a large amount of blood or clots in your urine.  You feel extremely weak or faint, or you pass out. This information is not intended to replace advice given to you by your health care provider. Make sure you discuss any questions you have with your health care provider. Document Released: 01/06/2005 Document Revised: 06/14/2015 Document Reviewed: 09/06/2012 Elsevier Interactive Patient Education  2017 [employer].  Managing Your Hypertension Hypertension is commonly called high blood pressure. This is when the force of your blood pressing against the walls of your arteries is too strong. Arteries are blood  vessels that carry blood from your heart throughout your body. Hypertension forces the heart to work harder to pump blood, and may cause the arteries to become narrow or stiff. Having untreated or uncontrolled hypertension can cause heart attack, stroke, kidney disease, and other problems. What are blood pressure readings? A blood pressure reading consists of a higher number over  a lower number. Ideally, your blood pressure should be below 120/80. The first ("top") number is called the systolic pressure. It is a measure of the pressure in your arteries as your heart beats. The second ("bottom") number is called the diastolic pressure. It is a measure of the pressure in your arteries as the heart relaxes. What does my blood pressure reading mean? Blood pressure is classified into four stages. Based on your blood pressure reading, your health care provider may use the following stages to determine what type of treatment you need, if any. Systolic pressure and diastolic pressure are measured in a unit called mm Hg. Normal  Systolic pressure: below 937.  Diastolic pressure: below 80. Elevated  Systolic pressure: 169-678.  Diastolic pressure: below 80. Hypertension stage 1  Systolic pressure: 938-101.  Diastolic pressure: 75-10. Hypertension stage 2  Systolic pressure: 258 or above.  Diastolic pressure: 90 or above. What health risks are associated with hypertension? Managing your hypertension is an important responsibility. Uncontrolled hypertension can lead to:  A heart attack.  A stroke.  A weakened blood vessel (aneurysm).  Heart failure.  Kidney damage.  Eye damage.  Metabolic syndrome.  Memory and concentration problems.  What changes can I make to manage my hypertension? Hypertension can be managed by making lifestyle changes and possibly by taking medicines. Your health care provider will help you make a plan to bring your blood pressure within a normal range. Eating and drinking  Eat a diet that is high in fiber and potassium, and low in salt (sodium), added sugar, and fat. An example eating plan is called the DASH (Dietary Approaches to Stop Hypertension) diet. To eat this way: ? Eat plenty of fresh fruits and vegetables. Try to fill half of your plate at each meal with fruits and vegetables. ? Eat whole grains, such as whole wheat pasta,  brown rice, or whole grain bread. Fill about one quarter of your plate with whole grains. ? Eat low-fat diary products. ? Avoid fatty cuts of meat, processed or cured meats, and poultry with skin. Fill about one quarter of your plate with lean proteins such as fish, chicken without skin, beans, eggs, and tofu. ? Avoid premade and processed foods. These tend to be higher in sodium, added sugar, and fat.  Reduce your daily sodium intake. Most people with hypertension should eat less than 1,500 mg of sodium a day.  Limit alcohol intake to no more than 1 drink a day for nonpregnant women and 2 drinks a day for men. One drink equals 12 oz of beer, 5 oz of wine, or 1 oz of hard liquor. Lifestyle  Work with your health care provider to maintain a healthy body weight, or to lose weight. Ask what an ideal weight is for you.  Get at least 30 minutes of exercise that causes your heart to beat faster (aerobic exercise) most days of the week. Activities may include walking, swimming, or biking.  Include exercise to strengthen your muscles (resistance exercise), such as weight lifting, as part of your weekly exercise routine. Try to do these types of exercises for 30 minutes at least 3  days a week.  Do not use any products that contain nicotine or tobacco, such as cigarettes and e-cigarettes. If you need help quitting, ask your health care provider.  Control any long-term (chronic) conditions you have, such as high cholesterol or diabetes. Monitoring  Monitor your blood pressure at home as told by your health care provider. Your personal target blood pressure may vary depending on your medical conditions, your age, and other factors.  Have your blood pressure checked regularly, as often as told by your health care provider. Working with your health care provider  Review all the medicines you take with your health care provider because there may be side effects or interactions.  Talk with your health  care provider about your diet, exercise habits, and other lifestyle factors that may be contributing to hypertension.  Visit your health care provider regularly. Your health care provider can help you create and adjust your plan for managing hypertension. Will I need medicine to control my blood pressure? Your health care provider may prescribe medicine if lifestyle changes are not enough to get your blood pressure under control, and if:  Your systolic blood pressure is 130 or higher.  Your diastolic blood pressure is 80 or higher.  Take medicines only as told by your health care provider. Follow the directions carefully. Blood pressure medicines must be taken as prescribed. The medicine does not work as well when you skip doses. Skipping doses also puts you at risk for problems. Contact a health care provider if:  You think you are having a reaction to medicines you have taken.  You have repeated (recurrent) headaches.  You feel dizzy.  You have swelling in your ankles.  You have trouble with your vision. Get help right away if:  You develop a severe headache or confusion.  You have unusual weakness or numbness, or you feel faint.  You have severe pain in your chest or abdomen.  You vomit repeatedly.  You have trouble breathing. Summary  Hypertension is when the force of blood pumping through your arteries is too strong. If this condition is not controlled, it may put you at risk for serious complications.  Your personal target blood pressure may vary depending on your medical conditions, your age, and other factors. For most people, a normal blood pressure is less than 120/80.  Hypertension is managed by lifestyle changes, medicines, or both. Lifestyle changes include weight loss, eating a healthy, low-sodium diet, exercising more, and limiting alcohol. This information is not intended to replace advice given to you by your health care provider. Make sure you discuss any  questions you have with your health care provider. Document Released: 10/01/2011 Document Revised: 12/05/2015 Document Reviewed: 12/05/2015 Elsevier Interactive Patient Education  [employer].

## 2016-11-19 NOTE — Progress Notes (Addendum)
Subjective:  This chart was scribed for [name], MD by Tamsen Roers, at [place] at [facility].  This patient was seen in room 1 and the patient's care was started at 8:19 AM.   Chief Complaint  Patient presents with  . Urinary Frequency     Patient ID: [name], female    DOB: May 05, 1960, 56 y.o.   MRN: [id number]  HPI HPI Comments: [name] is a 56 y.o. female who presents to Primary Care at [facility] with a history of kidney stones complaining of intermittent blood in her urine (without clots), frequency and mainly left sided abdominal/flank pain (sometimes right).  Patient states that her symptoms were the worst last week.  She does not have any pain in the office today. Patient does have a low grade fever when she has flares and feels as if her symptoms have "never really gone away permanently".   She does recall having some blood in her underwear last week but has not had any this week.  She has been having normal bowel movements, denies any constipation or diarrhea. Her last menstrual cycle was 13 years ago and states she has not had any spotting since. She has never strained her urine or caught her kidney stone.   Patient had a scan November 2017 which revealed a 7 by 6 meter stone.   Patient would like a refill of her medications today.  Her at home blood pressures have been ranging around the 140's, ranges in the 130's if she takes her medications during the evenings.  Patient states that heart rate runs around the 90's when she's at home. Patient saw Dr.Boseman (four days ago) at [facility] where she had blood work completed. Patient denies any cramping with Losartan use.   HSV: no recent breakouts Allergies: Patient states that her allergies are managed well.  She is compliant with her Qvar and uses Ventolin during times when she has a cold.  Migraines: about once per month Patient takes Zoloft and has not changed her dosage on this.  She tries to  use good coping skills during times of increased stress.   She would like a refill of her Percocet- uses this when she is "unable to manage".  She has a gynecologist (Dr. Ardis Hughs- [facility])- Had an abnormal pap smear about 35 years ago, has not had any abnormalities since. Patient had a pelvic exam completed about three years ago.    Past Medical History:  Diagnosis Date  . Allergy   . Asthma   . Depression   . Deviated septum   . Herpes   . HTN (hypertension)   . Hypothyroid   . Insomnia   . Neuromuscular disorder ([place])   . Thyroid cancer ([place]) 2009    Current Outpatient Prescriptions on File Prior to Visit  Medication Sig Dispense Refill  . acyclovir (ZOVIRAX) 200 MG capsule TAKE 2 CAPSULES BY MOUTH 2 TIMES DAILY. 360 capsule 3  . beclomethasone (QVAR) 40 MCG/ACT inhaler Inhale 1 puff into the lungs 2 (two) times daily as needed. 1 Inhaler 2  . calcitRIOL (ROCALTROL) 0.25 MCG capsule TAKE 2 CAPSULES (0.5 MCG TOTAL) BY MOUTH DAILY. 180 capsule 1  . clonazePAM (KLONOPIN) 1 MG tablet Take 1 tablet (1 mg total) by mouth 3 (three) times daily as needed. 90 tablet 5  . cyclobenzaprine (FLEXERIL) 10 MG tablet TAKE 1 TABLET (10 MG TOTAL) BY MOUTH 3 (THREE) TIMES DAILY AS NEEDED FOR MUSCLE SPASMS. 30 tablet  2  . estradiol (ESTRACE) 1 MG tablet Take 1 tablet (1 mg total) by mouth daily. 90 tablet 4  . levothyroxine (SYNTHROID, LEVOTHROID) 112 MCG tablet Take 1 tablet (112 mcg total) by mouth daily before breakfast. 90 tablet 1  . losartan (COZAAR) 100 MG tablet Take 1 tablet (100 mg total) by mouth daily. 90 tablet 1  . montelukast (SINGULAIR) 10 MG tablet TAKE 1 TABLET (10 MG TOTAL) BY MOUTH AT BEDTIME. 90 tablet 3  . oxyCODONE-acetaminophen (PERCOCET) 10-325 MG tablet Take 1 tablet by mouth every 6 (six) hours as needed for pain. 30 tablet 0  . progesterone (PROMETRIUM) 200 MG capsule Take one tablet daily for 12 days of the month 40 capsule 4  . rizatriptan (MAXALT) 10 MG tablet Take 1  tablet (10 mg total) by mouth as needed for migraine. May repeat in 2 hours if needed 12 tablet 11  . sertraline (ZOLOFT) 50 MG tablet Take 50 mg by mouth daily.    . tamsulosin (FLOMAX) 0.4 MG CAPS capsule Take 1 capsule (0.4 mg total) by mouth daily. 30 capsule 3  . traMADol (ULTRAM) 50 MG tablet Take 1-2 tablets (50-100 mg total) by mouth every 6 (six) hours as needed. 240 tablet 2  . triamterene-hydrochlorothiazide (DYAZIDE) 37.5-25 MG capsule TAKE 1 EACH (1 CAPSULE TOTAL) BY MOUTH DAILY. 30 capsule 0  . VENTOLIN HFA 108 (90 Base) MCG/ACT inhaler INHALE 2 PUFFS INTO THE LUNGS EVERY 4 (FOUR) HOURS AS NEEDED FOR WHEEZING. 18 Inhaler 2  . verapamil (CALAN-SR) 180 MG CR tablet Take 2 tablets (360 mg total) by mouth at bedtime. 180 tablet 3   No current facility-administered medications on file prior to visit.     Allergies  Allergen Reactions  . Lisinopril    Past Surgical History:  Procedure Laterality Date  . CESAREAN SECTION    . CHOLECYSTECTOMY  03/26/2006  . DILATION AND CURETTAGE OF UTERUS  06/30/2000  . THYROIDECTOMY     PARATHYROIDECTOMY   Family History  Problem Relation Age of Onset  . Heart disease Father        CABG in his 42s  . Hyperlipidemia Father   . Heart disease Brother        CABG  . Heart disease Sister        Stents in 8s  . Hyperlipidemia Sister   . Heart disease Sister        CABG in 62s  . Hyperlipidemia Sister   . Hyperlipidemia Mother   . Breast cancer Maternal Aunt   . Hyperlipidemia Maternal Uncle   . Hyperlipidemia Maternal Grandmother   . Breast cancer Maternal Grandmother   . Hyperlipidemia Paternal Grandmother   . Breast cancer Paternal Grandmother    Social History   Social History  . Marital status: Married    Spouse name: N/A  . Number of children: 1  . Years of education: N/A   Occupational History  .      Teach elementary school   Social History Main Topics  . Smoking status: Former Smoker    Packs/day: 2.00    Years:  20.00    Quit date: 05/13/1990  . Smokeless tobacco: Never Used  . Alcohol use No     Comment: Rarely  . Drug use: No  . Sexual activity: Yes   Other Topics Concern  . None   Social History Narrative    She is a Radio producer.  Has an 59-year-old child.  Does    not smoke  cigarettes or drink alcohol.  She is married.         Depression screen [facility] 2/9 11/19/2016 05/19/2016 03/17/2016 03/07/2016 09/18/2015  Decreased Interest 0 0 0 0 0  Down, Depressed, Hopeless 0 0 0 0 0  PHQ - 2 Score 0 0 0 0 0     Review of Systems  Constitutional: Negative for chills and fever.  Eyes: Negative for pain, redness and itching.  Respiratory: Negative for cough, choking and shortness of breath.   Gastrointestinal: Negative for constipation, diarrhea, nausea and vomiting.  Genitourinary: Positive for flank pain, frequency and hematuria.  Musculoskeletal: Negative for neck pain and neck stiffness.  Neurological: Negative for syncope and speech difficulty.       Objective:   Physical Exam  Constitutional: She is oriented to person, place, and time. She appears well-developed and well-nourished. No distress.  HENT:  Head: Normocephalic and atraumatic.  Eyes: Pupils are equal, round, and reactive to light.  Pulmonary/Chest: Effort normal. No respiratory distress.  Abdominal: Soft. Normal appearance and bowel sounds are normal. She exhibits no distension and no mass. There is no hepatosplenomegaly. There is no tenderness. There is no rebound and no guarding.  Neurological: She is alert and oriented to person, place, and time.  Skin: Skin is warm and dry.  Psychiatric: She has a normal mood and affect.    Vitals:   11/19/16 0811  BP: (!) 146/88  Pulse: 78  Resp: 16  Temp: 97.8 F (36.6 C)  TempSrc: Oral  SpO2: 97%  Weight: 188 lb (85.3 kg)  Height: 5\' 5"  (1.651 m)         Assessment & Plan:  Okay to refill Klonopin and Oxycodone as needed. If blood pressure remains uncontrolled, could  DC Maxide and add HCTZ into Arb and start BB.   1. Urinary frequency   2. Gross hematuria   3. Left flank pain   4. Essential hypertension   5. Postsurgical hypoparathyroidism (HCC)   6. Cervical disc disorder with radiculopathy of cervical region   7. Medication monitoring encounter     Orders Placed This Encounter  Procedures  . CT Abdomen Pelvis Wo Contrast    Standing Status:   Future    Standing Expiration Date:   02/19/2018    Order Specific Question:   Is patient pregnant?    Answer:   No    Order Specific Question:   Preferred imaging location?    Answer:   [facility]    Order Specific Question:   Radiology Contrast Protocol - do NOT remove file path    Answer:   [email or web address]  . Comprehensive metabolic panel    Order Specific Question:   Has the patient fasted?    Answer:   No  . Phosphorus  . POCT urinalysis dipstick  . POCT Microscopic Urinalysis ([facility])    Meds ordered this encounter  Medications  . triamterene-hydrochlorothiazide (DYAZIDE) 37.5-25 MG capsule    Sig: Take 1 each (1 capsule total) by mouth daily.    Dispense:  90 capsule    Refill:  1  . albuterol (VENTOLIN HFA) 108 (90 Base) MCG/ACT inhaler    Sig: INHALE 2 PUFFS INTO THE LUNGS EVERY 4 (FOUR) HOURS AS NEEDED FOR WHEEZING.    Dispense:  18 Inhaler    Refill:  2    Please hold on file for when pt requests  . oxyCODONE-acetaminophen (PERCOCET) 10-325 MG tablet    Sig: Take 1 tablet by mouth every  6 (six) hours as needed for pain.    Dispense:  30 tablet    Refill:  0  . beclomethasone (QVAR) 40 MCG/ACT inhaler    Sig: Inhale 1 puff into the lungs 2 (two) times daily as needed.    Dispense:  1 Inhaler    Refill:  2  . calcitRIOL (ROCALTROL) 0.25 MCG capsule    Sig: TAKE 2 CAPSULES (0.5 MCG TOTAL) BY MOUTH DAILY.    Dispense:  180 capsule    Refill:  1  . candesartan (ATACAND) 32 MG tablet    Sig: Take 1 tablet (32 mg total) by mouth daily.     Dispense:  90 tablet    Refill:  1    I personally performed the services described in this documentation, which was scribed in my presence. The recorded information has been reviewed and considered, and addended by me as needed.   Delman Cheadle, M.D.  Primary Care at [facility] [address] [place], [place] [address] [phone] phone [phone] fax  11/21/16 12:27 AM  Ct Abdomen Pelvis Wo Contrast  Result Date: 01/09/2017 CLINICAL DATA:  History of kidney stones. Urinary frequency, gross hematuria and left flank pain. EXAM: CT ABDOMEN AND PELVIS WITHOUT CONTRAST TECHNIQUE: Multidetector CT imaging of the abdomen and pelvis was performed following the standard protocol without IV contrast. COMPARISON:  CT abdomen dated 04/06/2006. FINDINGS: Lower chest: No acute abnormality. Hepatobiliary: Left liver lobe cyst. No suspicious mass or lesion within the liver. Status post cholecystectomy. No bile duct dilatation. Pancreas: Unremarkable. No pancreatic ductal dilatation or surrounding inflammatory changes. Spleen: Normal in size without focal abnormality. Adrenals/Urinary Tract: Adrenal glands appear normal. 7 mm nonobstructing left renal stone, central renal pelvis. Several additional left-sided stones within the lower pole renal pelvis, largest measuring 7 mm (although possibly 2 smaller abutting stones). No hydronephrosis. No right-sided renal stone. No ureteral or bladder calculi identified. Bladder appears normal, partially decompressed. Stomach/Bowel: Bowel is normal in caliber. No bowel wall thickening or evidence of bowel wall inflammation. Appendix is normal. Stomach is unremarkable, decompressed. Vascular/Lymphatic: Aortic atherosclerosis. No enlarged lymph nodes seen in the abdomen or pelvis. Reproductive: Probable fibroids within the uterus. Adnexal regions are unremarkable. Other: No free fluid or abscess collection. No free intraperitoneal air. Musculoskeletal: Mild  degenerative change within the slightly scoliotic thoracolumbar spine. No acute or suspicious osseous finding. IMPRESSION: 1. No acute findings. 2. Left nephrolithiasis, largest renal stone measuring 7 mm. No associated hydronephrosis. No ureteral or bladder calculi. 3. Suspect uterine fibroid, possibly multiple. 4. Aortic atherosclerosis. Electronically Signed   By: Franki Cabot M.D.   On: 01/09/2017 11:33    01/09/2017 ADDENDUM: If pt is still having the episodes of intermittent left flank pain, gross hematuria, and urinary frequency, needs urology eval to see if could be due to mult 2mm kidneys stones in left renal pelvis.  REFERRAL TO UROLOGY PLACED.

## 2016-11-20 LAB — COMPREHENSIVE METABOLIC PANEL
ALBUMIN: 4.8 g/dL (ref 3.5–5.5)
ALT: 16 IU/L (ref 0–32)
AST: 20 IU/L (ref 0–40)
Albumin/Globulin Ratio: 2.1 (ref 1.2–2.2)
Alkaline Phosphatase: 58 IU/L (ref 39–117)
BUN / CREAT RATIO: 17 (ref 9–23)
BUN: 14 mg/dL (ref 6–24)
Bilirubin Total: <0.2 mg/dL (ref 0.0–1.2)
CALCIUM: 9.9 mg/dL (ref 8.7–10.2)
CO2: 26 mmol/L (ref 20–29)
CREATININE: 0.83 mg/dL (ref 0.57–1.00)
Chloride: 101 mmol/L (ref 96–106)
GFR, EST AFRICAN AMERICAN: 92 mL/min/{1.73_m2} (ref >59)
GFR, EST NON AFRICAN AMERICAN: 80 mL/min/{1.73_m2} (ref >59)
GLOBULIN, TOTAL: 2.3 g/dL (ref 1.5–4.5)
Glucose: 87 mg/dL (ref 65–99)
Potassium: 4 mmol/L (ref 3.5–5.2)
SODIUM: 142 mmol/L (ref 134–144)
TOTAL PROTEIN: 7.1 g/dL (ref 6.0–8.5)

## 2016-11-20 LAB — PHOSPHORUS: Phosphorus: 4.7 mg/dL — ABNORMAL HIGH (ref 2.5–4.5)

## 2016-11-25 ENCOUNTER — Encounter: Payer: Self-pay | Admitting: Family Medicine

## 2016-12-05 ENCOUNTER — Other Ambulatory Visit: Payer: Self-pay | Admitting: Family Medicine

## 2016-12-05 MED ORDER — BECLOMETHASONE DIPROP HFA 40 MCG/ACT IN AERB: 1.0000 | INHALATION_SPRAY | Freq: Two times a day (BID) | RESPIRATORY_TRACT | 5 refills | Status: DC | PRN

## 2016-12-16 ENCOUNTER — Encounter: Payer: Self-pay | Admitting: Family Medicine

## 2017-01-09 ENCOUNTER — Ambulatory Visit
Admission: RE | Admit: 2017-01-09 | Discharge: 2017-01-09 | Disposition: A | Discharge status: Home / Self Care | Payer: BLUE CROSS/BLUE SHIELD | Source: Ambulatory Visit | Attending: Family Medicine | Admitting: Family Medicine

## 2017-01-09 DIAGNOSIS — R310 Gross hematuria: Secondary | ICD-10-CM

## 2017-01-09 DIAGNOSIS — R350 Frequency of micturition: Secondary | ICD-10-CM

## 2017-01-09 DIAGNOSIS — R109 Unspecified abdominal pain: Secondary | ICD-10-CM

## 2017-01-09 MED ORDER — OLMESARTAN MEDOXOMIL 40 MG PO TABS: 40.0000 mg | ORAL_TABLET | Freq: Every day | ORAL | 1 refills | Status: DC

## 2017-01-09 NOTE — Addendum Note (Signed)
Addended by: [name] on: 01/09/2017 06:11 PM   Modules accepted: Orders

## 2017-01-30 ENCOUNTER — Ambulatory Visit: Payer: 59 | Admitting: Physician Assistant

## 2017-01-30 ENCOUNTER — Other Ambulatory Visit: Payer: Self-pay

## 2017-01-30 ENCOUNTER — Encounter: Payer: Self-pay | Admitting: Physician Assistant

## 2017-01-30 VITALS — BP 130/82 | HR 81 | Temp 99.0°F | Resp 16 | Ht 66.14 in | Wt 193.0 lb

## 2017-01-30 DIAGNOSIS — R5383 Other fatigue: Secondary | ICD-10-CM | POA: Diagnosis not present

## 2017-01-30 DIAGNOSIS — J0100 Acute maxillary sinusitis, unspecified: Secondary | ICD-10-CM

## 2017-01-30 DIAGNOSIS — H60502 Unspecified acute noninfective otitis externa, left ear: Secondary | ICD-10-CM

## 2017-01-30 LAB — POCT INFLUENZA A/B
Influenza A, POC: NEGATIVE
Influenza B, POC: NEGATIVE

## 2017-01-30 MED ORDER — HYDROCORTISONE-ACETIC ACID 1-2 % OT SOLN: 3.0000 [drp] | Freq: Two times a day (BID) | OTIC | 0 refills | Status: AC

## 2017-01-30 MED ORDER — AMOXICILLIN-POT CLAVULANATE 875-125 MG PO TABS: 1.0000 | ORAL_TABLET | Freq: Two times a day (BID) | ORAL | 0 refills | Status: AC

## 2017-01-30 NOTE — Progress Notes (Signed)
MRN: [id number] DOB: 06/01/1960  Subjective:   [name] is a 57 y.o. female presenting for chief complaint of Nasal Congestion (pt states she has had nasal congestion and pressure x 2 week with green mucus) and Fatigue .  Reports 2 week history of worsening nasal congestion, sinus pressure, pain on left sinus, and left ear itching and ear fullness.  Has overall just not felt well since this started.  Has tried nasal saline rinses and flonase with no full relief, occasionally will try mucinex. Denies fever, sore throat, wheezing, shortness of breath, chest pain and myalgia, chills, nausea, vomiting, abdominal pain and diarrhea. Has not had sick contact with anyone. Has history of seasonal allergies, no history of asthma. Patient has not had flu shot this season. Denies smoking. Denies any other aggravating or relieving factors, no other questions or concerns.  [name] has a current medication list which includes the following prescription(s): acyclovir, albuterol, beclomethasone, calcitriol, clonazepam, estradiol, levothyroxine, montelukast, olmesartan, oxycodone-acetaminophen, progesterone, rizatriptan, sertraline, triamterene-hydrochlorothiazide, and verapamil. Also is allergic to lisinopril.  [name]  has a past medical history of Allergy, Asthma, Depression, Deviated septum, Herpes, HTN (hypertension), Hypothyroid, Insomnia, Neuromuscular disorder ([place]), and Thyroid cancer ([place]) (2009). Also  has a past surgical history that includes Cesarean section; Thyroidectomy; Dilation and curettage of uterus (06/30/2000); and Cholecystectomy (03/26/2006).   Objective:   Vitals: BP 130/82   Pulse 81   Temp 99 F (37.2 C) (Oral)   Resp 16   Ht 5' 6.14" (1.68 m)   Wt 193 lb (87.5 kg)   LMP 06/17/2002   SpO2 98%   BMI 31.02 kg/m   Physical Exam  Constitutional: She is oriented to person, place, and time. She appears well-developed and well-nourished.  HENT:  Head: Normocephalic and  atraumatic.  Right Ear: Tympanic membrane, external ear and ear canal normal.  Left Ear: Tympanic membrane normal. There is drainage (mild amount of flaky yellow debris in ear canal, no fungal spores noted) and swelling (mild erythema and swelling of ear canal). No tenderness. Tympanic membrane is not perforated, not erythematous and not bulging.  Nose: Mucosal edema (moderate in right nostril, severe in left) and rhinorrhea present. Right sinus exhibits maxillary sinus tenderness. Right sinus exhibits no frontal sinus tenderness. Left sinus exhibits maxillary sinus tenderness. Left sinus exhibits no frontal sinus tenderness.  Mouth/Throat: Uvula is midline, oropharynx is clear and moist and mucous membranes are normal. No tonsillar exudate.  Eyes: Conjunctivae are normal.  Neck: Normal range of motion.  Cardiovascular: Normal rate, regular rhythm and normal heart sounds.  Pulmonary/Chest: Effort normal and breath sounds normal. She has no wheezes. She has no rhonchi. She has no rales.  Lymphadenopathy:       Head (right side): No submental, no submandibular, no tonsillar, no preauricular, no posterior auricular and no occipital adenopathy present.       Head (left side): No submental, no submandibular, no tonsillar, no preauricular, no posterior auricular and no occipital adenopathy present.    She has no cervical adenopathy.       Right: No supraclavicular adenopathy present.       Left: No supraclavicular adenopathy present.  Neurological: She is alert and oriented to person, place, and time.  Skin: Skin is warm and dry.  Psychiatric: She has a normal mood and affect.  Vitals reviewed.   Results for orders placed or performed in visit on 01/30/17 (from the past 24 hour(s))  POCT Influenza A/B     Status: None  Collection Time: 01/30/17  9:52 AM  Result Value Ref Range   Influenza A, POC Negative Negative   Influenza B, POC Negative Negative    Assessment and Plan :  1. Fatigue,  unspecified type - POCT Influenza A/B 2. Acute non-recurrent maxillary sinusitis Due to history, duration, and worsening of symptoms, will treat empirically at this time for underlying bacterial infection.  Patient encouraged to return to clinic if symptoms worsen, do not improve, or as needed. - amoxicillin-clavulanate (AUGMENTIN) 875-125 MG tablet; Take 1 tablet by mouth 2 (two) times daily for 7 days.  Dispense: 14 tablet; Refill: 0  3. Acute otitis externa of left ear, unspecified type Physical exam findings of the left ear consistent with mild otitis externa.  Will treat with acetic acid hydrocortisone solution at this time.  Patient encouraged to follow-up if symptoms persist despite treatment if symptoms worsen. - acetic acid-hydrocortisone (VOSOL-HC) OTIC solution; Place 3 drops into the left ear 2 (two) times daily for 7 days.  Dispense: 10 mL; Refill: 0  Stacey Delaine, PA-C  Primary Care at [place] 01/30/2017 10:19 AM

## 2017-01-30 NOTE — Patient Instructions (Addendum)
We will treat your sinus infection with an antibiotic at this time.   We will treat your ear itching with a topical ear drop.   To use the ear drops: -Lie down or tilt your head with your ear facing upward. Open the ear canal by gently pulling your ear back, or pulling downward on the earlobe when giving this medicine to a child. -Hold the dropper upside down over your ear and drop the correct number of drops into the ear. -Stay lying down or with your head tilted for at least 5 minutes. You may use a small piece of cotton to plug the ear and keep the medicine from draining out. -Do not touch the dropper tip or place it directly in your ear. It may become contaminated. Wipe the tip with a clean tissue but do not wash with water or soap. -Use this medicine for the full prescribed length of time. Your symptoms may improve before the infection is completely cleared. Skipping doses may also increase your risk of further infection that is resistant to antibiotics.  Return to clinic if symptoms worsen, do not improve in 7-10 days, or as needed  Sinusitis, Adult Sinusitis is soreness and inflammation of your sinuses. Sinuses are hollow spaces in the bones around your face. They are located:  Around your eyes.  In the middle of your forehead.  Behind your nose.  In your cheekbones.  Your sinuses and nasal passages are lined with a stringy fluid (mucus). Mucus normally drains out of your sinuses. When your nasal tissues get inflamed or swollen, the mucus can get trapped or blocked so air cannot flow through your sinuses. This lets bacteria, viruses, and funguses grow, and that leads to infection. Follow these instructions at home: Medicines  Take, use, or apply over-the-counter and prescription medicines only as told by your doctor. These may include nasal sprays.  If you were prescribed an antibiotic medicine, take it as told by your doctor. Do not stop taking the antibiotic even if you start  to feel better. Hydrate and Humidify  Drink enough water to keep your pee (urine) clear or pale yellow.  Use a cool mist humidifier to keep the humidity level in your home above 50%.  Breathe in steam for 10-15 minutes, 3-4 times a day or as told by your doctor. You can do this in the bathroom while a hot shower is running.  Try not to spend time in cool or dry air. Rest  Rest as much as possible.  Sleep with your head raised (elevated).  Make sure to get enough sleep each night. General instructions  Put a warm, moist washcloth on your face 3-4 times a day or as told by your doctor. This will help with discomfort.  Wash your hands often with soap and water. If there is no soap and water, use hand sanitizer.  Do not smoke. Avoid being around people who are smoking (secondhand smoke).  Keep all follow-up visits as told by your doctor. This is important. Contact a doctor if:  You have a fever.  Your symptoms get worse.  Your symptoms do not get better within 10 days. Get help right away if:  You have a very bad headache.  You cannot stop throwing up (vomiting).  You have pain or swelling around your face or eyes.  You have trouble seeing.  You feel confused.  Your neck is stiff.  You have trouble breathing. This information is not intended to replace advice given  to you by your health care provider. Make sure you discuss any questions you have with your health care provider. Document Released: 06/25/2007 Document Revised: 09/02/2015 Document Reviewed: 11/01/2014 Elsevier Interactive Patient Education  2018 [employer].    IF you received an x-ray today, you will receive an invoice from [facility]. Please contact [facility] at [phone] with questions or concerns regarding your invoice.   IF you received labwork today, you will receive an invoice from [facility]. Please contact [facility] at [phone] with questions or concerns regarding  your invoice.   Our billing staff will not be able to assist you with questions regarding bills from these companies.  You will be contacted with the lab results as soon as they are available. The fastest way to get your results is to activate your My Chart account. Instructions are located on the last page of this paperwork. If you have not heard from [place] regarding the results in 2 weeks, please contact this office.

## 2017-02-11 ENCOUNTER — Other Ambulatory Visit: Payer: Self-pay | Admitting: *Deleted

## 2017-02-11 ENCOUNTER — Telehealth: Payer: Self-pay | Admitting: Family Medicine

## 2017-02-11 MED ORDER — ALBUTEROL SULFATE HFA 108 (90 BASE) MCG/ACT IN AERS: INHALATION_SPRAY | RESPIRATORY_TRACT | 2 refills | Status: DC

## 2017-02-11 NOTE — Telephone Encounter (Signed)
Copied from [place]. Topic: Quick Communication - Rx Refill/Question >> Feb 11, 2017  8:48 AM Arletha Grippe wrote: Medication: albuterol (VENTOLIN HFA) 108 (90 Base) MCG/ACT inhaler   Has the patient contacted their pharmacy? No. Does not have current rx, needs new rx    (Agent: If no, request that the patient contact the pharmacy for the refill.)   Preferred Pharmacy (with phone number or street name): [facility] [address]    Agent: Please be advised that RX refills may take up to 3 business days. We ask that you follow-up with your pharmacy.

## 2017-02-12 ENCOUNTER — Encounter: Payer: Self-pay | Admitting: Physician Assistant

## 2017-02-12 ENCOUNTER — Other Ambulatory Visit: Payer: Self-pay

## 2017-02-12 ENCOUNTER — Ambulatory Visit: Payer: 59 | Admitting: Physician Assistant

## 2017-02-12 VITALS — BP 142/92 | HR 89 | Temp 98.2°F | Resp 18 | Ht 65.83 in | Wt 191.6 lb

## 2017-02-12 DIAGNOSIS — J9801 Acute bronchospasm: Secondary | ICD-10-CM | POA: Diagnosis not present

## 2017-02-12 DIAGNOSIS — R062 Wheezing: Secondary | ICD-10-CM

## 2017-02-12 DIAGNOSIS — R059 Cough, unspecified: Secondary | ICD-10-CM

## 2017-02-12 DIAGNOSIS — R05 Cough: Secondary | ICD-10-CM | POA: Diagnosis not present

## 2017-02-12 DIAGNOSIS — H60502 Unspecified acute noninfective otitis externa, left ear: Secondary | ICD-10-CM | POA: Diagnosis not present

## 2017-02-12 DIAGNOSIS — R0981 Nasal congestion: Secondary | ICD-10-CM

## 2017-02-12 LAB — POCT CBC
GRANULOCYTE PERCENT: 68.4 % (ref 37–80)
HCT, POC: 44.6 % (ref 37.7–47.9)
HEMOGLOBIN: 14.4 g/dL (ref 12.2–16.2)
Lymph, poc: 1.4 (ref 0.6–3.4)
MCH: 28.2 pg (ref 27–31.2)
MCHC: 32.3 g/dL (ref 31.8–35.4)
MCV: 87.2 fL (ref 80–97)
MID (cbc): 0.4 (ref 0–0.9)
MPV: 8.5 fL (ref 0–99.8)
POC GRANULOCYTE: 3.8 (ref 2–6.9)
POC LYMPH PERCENT: 24.6 %L (ref 10–50)
POC MID %: 7 % (ref 0–12)
Platelet Count, POC: 200 10*3/uL (ref 142–424)
RBC: 5.12 M/uL (ref 4.04–5.48)
RDW, POC: 14.1 %
WBC: 5.6 10*3/uL (ref 4.6–10.2)

## 2017-02-12 MED ORDER — CIPROFLOXACIN-HYDROCORTISONE 0.2-1 % OT SUSP: 3.0000 [drp] | Freq: Two times a day (BID) | OTIC | 0 refills | Status: AC

## 2017-02-12 MED ORDER — PREDNISONE 20 MG PO TABS: 40.0000 mg | ORAL_TABLET | Freq: Every day | ORAL | 0 refills | Status: DC

## 2017-02-12 MED ORDER — BENZONATATE 100 MG PO CAPS: 100.0000 mg | ORAL_CAPSULE | Freq: Three times a day (TID) | ORAL | 0 refills | Status: DC | PRN

## 2017-02-12 MED ORDER — ALBUTEROL SULFATE (2.5 MG/3ML) 0.083% IN NEBU
2.5000 mg | INHALATION_SOLUTION | Freq: Once | RESPIRATORY_TRACT | Status: AC
  Administered 2017-02-12: 2.5 mg via RESPIRATORY_TRACT

## 2017-02-12 MED ORDER — IPRATROPIUM BROMIDE 0.02 % IN SOLN
0.5000 mg | Freq: Once | RESPIRATORY_TRACT | Status: AC
Start: 2017-02-12 — End: 2017-02-12
  Administered 2017-02-12: 0.5 mg via RESPIRATORY_TRACT

## 2017-02-12 MED ORDER — HYDROCODONE-HOMATROPINE 5-1.5 MG/5ML PO SYRP
5.0000 mL | ORAL_SOLUTION | Freq: Three times a day (TID) | ORAL | 0 refills | Status: DC | PRN
Start: 2017-02-12 — End: 2017-07-11

## 2017-02-12 NOTE — Patient Instructions (Addendum)
Use albuterol inhaler every 4-6 hours as needed. We are going to treat your underlying inflammation with oral prednisone. Prednisone is a steroid and can cause side effects such as headache, irritability, nausea, vomiting, increased heart rate, increased blood pressure, increased blood sugar, appetite changes, and insomnia. Please take tablets in the morning with a full meal to help decrease the chances of these side effects.   To use the ear drops: -Lie down or tilt your head with your ear facing upward. Open the ear canal by gently pulling your ear back, or pulling downward on the earlobe when giving this medicine to a child. -Hold the dropper upside down over your ear and drop the correct number of drops into the ear. -Stay lying down or with your head tilted for at least 5 minutes. You may use a small piece of cotton to plug the ear and keep the medicine from draining out. -Do not touch the dropper tip or place it directly in your ear. It may become contaminated. Wipe the tip with a clean tissue but do not wash with water or soap. -Use this medicine for the full prescribed length of time. Your symptoms may improve before the infection is completely cleared. Skipping doses may also increase your risk of further infection that is resistant to antibiotics.   If no improvement in cough in 3 days, contact me.  If no improvement in ear in 7 days, contact me.   Thank you for letting me participate in your health and well being.  Bronchospasm, Adult Bronchospasm is when airways in the lungs get smaller. When this happens, it can be hard to breathe. You may cough. You may also make a whistling sound when you breathe (wheeze). Follow these instructions at home: Medicines  Take over-the-counter and prescription medicines only as told by your doctor.  If you need to use an inhaler or nebulizer to take your medicine, ask your doctor how to use it.  If you were given a spacer, always use it with your  inhaler. Lifestyle  Change your heating and air conditioning filter. Do this at least once a month.  Try not to use fireplaces and wood stoves.  Do not  smoke. Do not  allow smoking in your home.  Try not to use things that have a strong smell, like perfume.  Get rid of pests (such as roaches and mice) and their poop.  Remove any mold from your home.  Keep your house clean. Get rid of dust.  Use cleaning products that have no smell.  Replace carpet with wood, tile, or vinyl flooring.  Use allergy-proof pillows, mattress covers, and box spring covers.  Wash bed sheets and blankets every week. Use hot water. Dry them in a dryer.  Use blankets that are made of polyester or cotton.  Wash your hands often.  Keep pets out of your bedroom.  When you exercise, try not to breathe in cold air. General instructions  Have a plan for getting medical care. Know these things: ? When to call your doctor. ? When to call local emergency services (911 in the [place] ? Where to go in an emergency.  Stay up to date on your shots (immunizations).  When you have an episode: ? Stay calm. ? Relax. ? Breathe slowly. Contact a doctor if:  Your muscles ache.  Your chest hurts.  The color of the mucus you cough up (sputum) changes from clear or white to yellow, green, gray, or bloody.  The  mucus you cough up gets thicker.  You have a fever. Get help right away if:  The whistling sound gets worse, even after you take your medicines.  Your coughing gets worse.  You find it even harder to breathe.  Your chest hurts very much. Summary  Bronchospasm is when airways in the lungs get smaller.  When this happens, it can be hard to breathe. You may cough. You may also make a whistling sound when you breathe.  Stay away from things that cause you to have episodes. These include smoke or dust. This information is not intended to replace advice given to you by your health care  provider. Make sure you discuss any questions you have with your health care provider. Document Released: 11/03/2008 Document Revised: 01/10/2016 Document Reviewed: 01/10/2016 Elsevier Interactive Patient Education  2017 [employer]    Otitis Externa Otitis externa is an infection of the outer ear canal. The outer ear canal is the area between the outside of the ear and the eardrum. Otitis externa is sometimes called "swimmer's ear." Follow these instructions at home:  If you were given antibiotic ear drops, use them as told by your doctor. Do not stop using them even if your condition gets better.  Take over-the-counter and prescription medicines only as told by your doctor.  Keep all follow-up visits as told by your doctor. This is important. How is this prevented?  Keep your ear dry. Use the corner of a towel to dry your ear after you swim or bathe.  Try not to scratch or put things in your ear. Doing these things makes it easier for germs to grow in your ear.  Avoid swimming in lakes, dirty water, or pools that may not have the right amount of a chemical called chlorine.  Consider making ear drops and putting 3 or 4 drops in each ear after you swim. Ask your doctor about how you can make ear drops. Contact a doctor if:  You have a fever.  After 3 days your ear is still red, swollen, or painful.  After 3 days you still have pus coming from your ear.  Your redness, swelling, or pain gets worse.  You have a really bad headache.  You have redness, swelling, pain, or tenderness behind your ear. This information is not intended to replace advice given to you by your health care provider. Make sure you discuss any questions you have with your health care provider. Document Released: 06/25/2007 Document Revised: 02/01/2015 Document Reviewed: 10/16/2014 Elsevier Interactive Patient Education  2018 [employer].  IF you received an x-ray today, you will receive an invoice from  [facility]. Please contact [facility] at [phone] with questions or concerns regarding your invoice.   IF you received labwork today, you will receive an invoice from [facility]. Please contact [facility] at [phone] with questions or concerns regarding your invoice.   Our billing staff will not be able to assist you with questions regarding bills from these companies.  You will be contacted with the lab results as soon as they are available. The fastest way to get your results is to activate your My Chart account. Instructions are located on the last page of this paperwork. If you have not heard from [place] regarding the results in 2 weeks, please contact this office.

## 2017-02-12 NOTE — Progress Notes (Signed)
MRN: [id number] DOB: 1960/07/21  Subjective:   [name] is a 57 y.o. female presenting for chief complaint of Cough (X 6 days) and Ear Fullness (left ear) .  Reports 8 day history of dry hacking cough.Has had some wheezing and SOB. The cough is keeping her up at night. Has had some off and on subjective fever and continued left ear pain and itching. Has tried delsym cough syrup and humidifier with some relief.  Of note, she was just treated for sinus infection.  Notes the sinus pain improved but she is still having nasal congestion.  Was also treated with acetic acid-hydrocortisone eardrops, which she notes did not help.  Denies sore throat and chest pain, chills, fatigue, nausea, vomiting, abdominal pain, diarrhea and generalized body aches. Has not had sick contact with anyone. Has history of seasonal allergies, had history of asthma as a child. Patient has not had flu shot this season. Denies smoking. Denies any other aggravating or relieving factors, no other questions or concerns.  Media has a current medication list which includes the following prescription(s): acyclovir, albuterol, beclomethasone, calcitriol, clonazepam, estradiol, levothyroxine, montelukast, olmesartan, oxycodone-acetaminophen, progesterone, rizatriptan, sertraline, triamterene-hydrochlorothiazide, verapamil, and ciprofloxacin-hydrocortisone. Also is allergic to lisinopril.  Jeneane  has a past medical history of Allergy, Asthma, Depression, Deviated septum, Herpes, HTN (hypertension), Hypothyroid, Insomnia, Neuromuscular disorder ([facility]), and Thyroid cancer ([place]) (2009). Also  has a past surgical history that includes Cesarean section; Thyroidectomy; Dilation and curettage of uterus (06/30/2000); and Cholecystectomy (03/26/2006).   Objective:   Vitals: BP (!) 142/92   Pulse 89   Temp 98.2 F (36.8 C) (Oral)   Resp 18   Ht 5' 5.83" (1.672 m)   Wt 191 lb 9.6 oz (86.9 kg)   LMP 06/17/2002   SpO2 97%   BMI  31.09 kg/m   Physical Exam  Constitutional: She is oriented to person, place, and time. She appears well-developed and well-nourished.  HENT:  Head: Normocephalic and atraumatic.  Right Ear: Tympanic membrane, external ear and ear canal normal.  Left Ear: There is drainage ( moderate amount of flaky yellow debris in ear canal, no fungal spores noted ), swelling (of ear canal, mild) and tenderness (with palpation of tragus). No mastoid tenderness. Tympanic membrane is injected. Tympanic membrane is not perforated, not retracted and not bulging.  Nose: Mucosal edema (servere on left, moderate on right) and rhinorrhea present. Right sinus exhibits no maxillary sinus tenderness and no frontal sinus tenderness. Left sinus exhibits no maxillary sinus tenderness and no frontal sinus tenderness.  Eyes: Conjunctivae are normal.  Neck: Normal range of motion.  Cardiovascular: Normal rate, regular rhythm and normal heart sounds.  Pulmonary/Chest: Effort normal. She has wheezes (diffuse intermittent wheezes noted throughout posterior lung field). She has no rhonchi. She has no rales.  Lymphadenopathy:       Head (right side): No submental, no submandibular, no tonsillar, no preauricular, no posterior auricular and no occipital adenopathy present.       Head (left side): No submental, no submandibular, no tonsillar, no preauricular, no posterior auricular and no occipital adenopathy present.    She has no cervical adenopathy.       Right: No supraclavicular adenopathy present.       Left: No supraclavicular adenopathy present.  Neurological: She is alert and oriented to person, place, and time.  Skin: Skin is warm and dry.  Psychiatric: She has a normal mood and affect.  Vitals reviewed.   Results for orders placed  or performed in visit on 02/12/17 (from the past 24 hour(s))  POCT CBC     Status: None   Collection Time: 02/12/17  1:20 PM  Result Value Ref Range   WBC 5.6 4.6 - 10.2 K/uL   Lymph,  poc 1.4 0.6 - 3.4   POC LYMPH PERCENT 24.6 10 - 50 %L   MID (cbc) 0.4 0 - 0.9   POC MID % 7.0 0 - 12 %M   POC Granulocyte 3.8 2 - 6.9   Granulocyte percent 68.4 37 - 80 %G   RBC 5.12 4.04 - 5.48 M/uL   Hemoglobin 14.4 12.2 - 16.2 g/dL   HCT, POC 44.6 37.7 - 47.9 %   MCV 87.2 80 - 97 fL   MCH, POC 28.2 27 - 31.2 pg   MCHC 32.3 31.8 - 35.4 g/dL   RDW, POC 14.1 %   Platelet Count, POC 200 142 - 424 K/uL   MPV 8.5 0 - 99.8 fL   BP Readings from Last 3 Encounters:  02/12/17 (!) 142/92  01/30/17 130/82  11/19/16 (!) 146/88   Wheezing significantly improved with breathing treatment. Mild faint wheezes noted after duoneb.  Patient reports that she feels more open.  Assessment and Plan :  1. Cough - HYDROcodone-homatropine (HYCODAN) 5-1.5 MG/5ML syrup; Take 5 mLs by mouth every 8 (eight) hours as needed for cough.  Dispense: 120 mL; Refill: 0 - benzonatate (TESSALON) 100 MG capsule; Take 1-2 capsules (100-200 mg total) by mouth 3 (three) times daily as needed for cough.  Dispense: 40 capsule; Refill: 0 2. Wheezing - POCT CBC - ipratropium (ATROVENT) nebulizer solution 0.5 mg - albuterol (PROVENTIL) (2.5 MG/3ML) 0.083% nebulizer solution 2.5 mg 3. Bronchospasm Pt is overall well appearing.  She is afebrile.  Mild wheezes still noted on exam.  Will treat with oral prednisone at this time for underlying bronchospasm.  WBC WNL.  Advised to continue albuterol inhaler every 4-6 hours as needed for wheezing.  Do not suspect underlying bacteria at this time.  Likely viral etiology.  If no improvement in 3-5 days patient instructed to contact our office, will consider Z-Pak at that time.  If symptoms worsen, seek care sooner. - predniSONE (DELTASONE) 20 MG tablet; Take 2 tablets (40 mg total) by mouth daily with breakfast.  Dispense: 10 tablet; Refill: 0  4. Acute otitis externa of left ear, unspecified type On 01/31/16, she was treated with acetic acid hydrocortisone eardrops for acute otitis  externa.  Physical exam findings show that the otitis externa has persisted and appears to have worsened.  Will treat with Cipro HC drops at this time.  If no improvement in 1 week, patient advised to contact our office and will refer to ENT for further evaluation.  Return sooner if symptoms worsen. - ciprofloxacin-hydrocortisone (CIPRO HC) OTIC suspension; Place 3 drops into the left ear 2 (two) times daily for 7 days.  Dispense: 10 mL; Refill: 0 5. Nasal congestion Encouraged to continue allergy medication.  Prednisone will likely help with nasal congestion as well.  Tenna Delaine, PA-C  [facility] at [facility] 02/12/2017 1:32 PM

## 2017-02-13 ENCOUNTER — Telehealth: Payer: Self-pay | Admitting: Family Medicine

## 2017-02-13 ENCOUNTER — Encounter: Payer: Self-pay | Admitting: Physician Assistant

## 2017-02-13 NOTE — Telephone Encounter (Signed)
Pt says that she was seen by Timmothy Euler and was advised that they would try the Z-pak if steroid doesn't work, pt says that she would like to have Rx sent in to pharmacy.   Pharmacy: [id number] - [place], [place] [address]

## 2017-02-14 ENCOUNTER — Ambulatory Visit: Payer: 59 | Admitting: Family Medicine

## 2017-02-14 ENCOUNTER — Telehealth: Payer: Self-pay | Admitting: Family Medicine

## 2017-02-14 MED ORDER — AZITHROMYCIN 250 MG PO TABS: ORAL_TABLET | ORAL | 0 refills | Status: DC

## 2017-02-14 NOTE — Telephone Encounter (Signed)
Sent in

## 2017-02-14 NOTE — Telephone Encounter (Signed)
Received call from the answering service RN. Pt called in because she is still feeling horrible and having to use her albuterol inhaler every 3 hrs. She was seen by Timmothy Euler 2d prior and started on a burst of prednisone for viral URI w/ asthma exacerbation. Advised if not improving we would cal in zpack and pt wants this.  Advised that if she is needing her inhaler so frequently while ON prednisone - she needs to be seen today - urgently. We still having openings. Have pt call the office to sched. I could see her at 3:40. RN will inform pt. Asked clerical to also call pt to offer her appt.

## 2017-02-14 NOTE — Telephone Encounter (Signed)
Informed by clerical that pt very upset. She doesn't have the $$ to come in for another OV and she was promised the zpack by [place].  Will send in zpack but asked clerical to relay that I don't think that this antibiotic is going to help her asthma exacerbation, she is at risk of developing respiratory distress if she is dependent upon her inhaler so often, and I think she will still need to be seen again to reassess and so find more effective treatment.

## 2017-02-14 NOTE — Addendum Note (Signed)
Addended by: [name] on: 02/14/2017 10:57 AM   Modules accepted: Orders

## 2017-03-14 ENCOUNTER — Other Ambulatory Visit: Payer: Self-pay | Admitting: Family Medicine

## 2017-03-14 ENCOUNTER — Encounter: Payer: Self-pay | Admitting: Family Medicine

## 2017-03-14 DIAGNOSIS — M5010 Cervical disc disorder with radiculopathy, unspecified cervical region: Secondary | ICD-10-CM

## 2017-03-17 ENCOUNTER — Other Ambulatory Visit: Payer: Self-pay | Admitting: Family Medicine

## 2017-03-17 DIAGNOSIS — M5010 Cervical disc disorder with radiculopathy, unspecified cervical region: Secondary | ICD-10-CM

## 2017-03-17 MED ORDER — OXYCODONE-ACETAMINOPHEN 10-325 MG PO TABS: 1.0000 | ORAL_TABLET | Freq: Four times a day (QID) | ORAL | 0 refills | Status: DC | PRN

## 2017-05-09 ENCOUNTER — Other Ambulatory Visit: Payer: Self-pay | Admitting: Family Medicine

## 2017-05-11 NOTE — Telephone Encounter (Signed)
Needs appt for further refills.

## 2017-06-12 ENCOUNTER — Other Ambulatory Visit: Payer: Self-pay | Admitting: Family Medicine

## 2017-06-27 ENCOUNTER — Ambulatory Visit: Payer: 59 | Admitting: Family Medicine

## 2017-07-11 ENCOUNTER — Ambulatory Visit: Payer: Self-pay | Admitting: Family Medicine

## 2017-07-11 ENCOUNTER — Other Ambulatory Visit: Payer: Self-pay

## 2017-07-11 ENCOUNTER — Encounter: Payer: Self-pay | Admitting: Family Medicine

## 2017-07-11 VITALS — BP 158/80 | HR 80 | Temp 98.6°F | Resp 16 | Ht 65.35 in | Wt 192.0 lb

## 2017-07-11 DIAGNOSIS — H04552 Acquired stenosis of left nasolacrimal duct: Secondary | ICD-10-CM

## 2017-07-11 DIAGNOSIS — M5010 Cervical disc disorder with radiculopathy, unspecified cervical region: Secondary | ICD-10-CM

## 2017-07-11 DIAGNOSIS — H60543 Acute eczematoid otitis externa, bilateral: Secondary | ICD-10-CM

## 2017-07-11 DIAGNOSIS — I10 Essential (primary) hypertension: Secondary | ICD-10-CM

## 2017-07-11 MED ORDER — FLUOCINOLONE ACETONIDE 0.01 % OT OIL: 5.0000 [drp] | TOPICAL_OIL | Freq: Two times a day (BID) | OTIC | 0 refills | Status: DC

## 2017-07-11 MED ORDER — AZELASTINE HCL 0.05 % OP SOLN: 1.0000 [drp] | Freq: Two times a day (BID) | OPHTHALMIC | 1 refills | Status: DC

## 2017-07-11 MED ORDER — OXYCODONE-ACETAMINOPHEN 10-325 MG PO TABS: 1.0000 | ORAL_TABLET | Freq: Four times a day (QID) | ORAL | 0 refills | Status: DC | PRN

## 2017-07-11 MED ORDER — CLONAZEPAM 1 MG PO TABS: 1.0000 mg | ORAL_TABLET | Freq: Three times a day (TID) | ORAL | 5 refills | Status: DC | PRN

## 2017-07-11 MED ORDER — RIZATRIPTAN BENZOATE 10 MG PO TABS: 10.0000 mg | ORAL_TABLET | ORAL | 11 refills | Status: DC | PRN

## 2017-07-11 MED ORDER — VERAPAMIL HCL ER 180 MG PO TBCR: 360.0000 mg | EXTENDED_RELEASE_TABLET | Freq: Every day | ORAL | 3 refills | Status: AC

## 2017-07-11 MED ORDER — BETAMETHASONE DIPROPIONATE 0.05 % EX CREA: TOPICAL_CREAM | Freq: Two times a day (BID) | CUTANEOUS | 0 refills | Status: DC

## 2017-07-11 NOTE — Patient Instructions (Addendum)
Apply saline eye drops as frequently as you can and saline drops every night before bed.    IF you received an x-ray today, you will receive an invoice from [facility]. Please contact [facility] at [phone] with questions or concerns regarding your invoice.   IF you received labwork today, you will receive an invoice from [facility]. Please contact [facility] at [phone] with questions or concerns regarding your invoice.   Our billing staff will not be able to assist you with questions regarding bills from these companies.  You will be contacted with the lab results as soon as they are available. The fastest way to get your results is to activate your My Chart account. Instructions are located on the last page of this paperwork. If you have not heard from [place] regarding the results in 2 weeks, please contact this office.     Atopic Dermatitis Atopic dermatitis is a skin disorder that causes inflammation of the skin. This is the most common type of eczema. Eczema is a group of skin conditions that cause the skin to be itchy, red, and swollen. This condition is generally worse during the cooler winter months and often improves during the warm summer months. Symptoms can vary from person to person. Atopic dermatitis usually starts showing signs in infancy and can last through adulthood. This condition cannot be passed from one person to another (non-contagious), but it is more common in families. Atopic dermatitis may not always be present. When it is present, it is called a flare-up. What are the causes? The exact cause of this condition is not known. Flare-ups of the condition may be triggered by:  Contact with something that you are sensitive or allergic to.  Stress.  Certain foods.  Extremely hot or cold weather.  Harsh chemicals and soaps.  Dry air.  Chlorine.  What increases the risk? This condition is more likely to develop in people who have a personal  history or family history of eczema, allergies, asthma, or hay fever. What are the signs or symptoms? Symptoms of this condition include:  Dry, scaly skin.  Red, itchy rash.  Itchiness, which can be severe. This may occur before the skin rash. This can make sleeping difficult.  Skin thickening and cracking that can occur over time.  How is this diagnosed? This condition is diagnosed based on your symptoms, a medical history, and a physical exam. How is this treated? There is no cure for this condition, but symptoms can usually be controlled. Treatment focuses on:  Controlling the itchiness and scratching. You may be given medicines, such as antihistamines or steroid creams.  Limiting exposure to things that you are sensitive or allergic to (allergens).  Recognizing situations that cause stress and developing a plan to manage stress.  If your atopic dermatitis does not get better with medicines, or if it is all over your body (widespread), a treatment using a specific type of light (phototherapy) may be used. Follow these instructions at home: Skin care  Keep your skin well-moisturized. Doing this seals in moisture and helps to prevent dryness. ? Use unscented lotions that have petroleum in them. ? Avoid lotions that contain alcohol or water. They can dry the skin.  Keep baths or showers short (less than 5 minutes) in warm water. Do not use hot water. ? Use mild, unscented cleansers for bathing. Avoid soap and bubble bath. ? Apply a moisturizer to your skin right after a bath or shower.  Do not apply anything to  your skin without checking with your health care provider. General instructions  Dress in clothes made of cotton or cotton blends. Dress lightly because heat increases itchiness.  When washing your clothes, rinse your clothes twice so all of the soap is removed.  Avoid any triggers that can cause a flare-up.  Try to manage your stress.  Keep your fingernails cut  short.  Avoid scratching. Scratching makes the rash and itchiness worse. It may also result in a skin infection (impetigo) due to a break in the skin caused by scratching.  Take or apply over-the-counter and prescription medicines only as told by your health care provider.  Keep all follow-up visits as told by your health care provider. This is important.  Do not be around people who have cold sores or fever blisters. If you get the infection, it may cause your atopic dermatitis to worsen. Contact a health care provider if:  Your itchiness interferes with sleep.  Your rash gets worse or it is not better within one week of starting treatment.  You have a fever.  You have a rash flare-up after having contact with someone who has cold sores or fever blisters. Get help right away if:  You develop pus or soft yellow scabs in the rash area. Summary  This condition causes a red rash and itchy, dry, scaly skin.  Treatment focuses on controlling the itchiness and scratching, limiting exposure to things that you are sensitive or allergic to (allergens), recognizing situations that cause stress, and developing a plan to manage stress.  Keep your skin well-moisturized.  Keep baths or showers shorter than 5 minutes and use warm water. Do not use hot water. This information is not intended to replace advice given to you by your health care provider. Make sure you discuss any questions you have with your health care provider. Document Released: 01/04/2000 Document Revised: 02/08/2016 Document Reviewed: 02/08/2016 Elsevier Interactive Patient Education  2018 [employer].   Dacryocystitis Dacryocystitis is an infection of the sac that collects tears (lacrimal sac). The lacrimal sac is located between the inner corner of the eye and the nose. The glands of the eyelids make tears that keep the surface of the eye wet and protected. Tears drain from two small tubes (ducts) in the eyelids. These  ducts carry tears to the lacrimal sac. Another tube (nasolacrimal duct) carries tears from the lacrimal sac down into the nose. Dacryocystitis can be sudden (acute) or long-lasting (chronic). It usually affects only one eye. What are the causes? The most common cause of this condition is a blocked nasolacrimal duct. When this duct is blocked, tears cannot drain into the nose, and tears become backed up in the lacrimal sac. Bacteria that normally live in the eye, on the skin, or in the nose start to grow inside the sac and cause infection. The nasolacrimal duct may become blocked because of:  A nose or sinus infection that spreads into the duct.  A duct that is abnormally shaped (malformed).  A growth or swelling in the nose.  An injury or surgery that narrows or scars the duct.  Dacryocystitis also may start as an eye infection that spreads to the lacrimal sac. Sometimes the cause of dacryocystitis is not known. What increases the risk? This condition is more likely to develop in people who:  Are older than 40.  Are female. Women tend to have a narrower nasolacrimal duct than men.  Have had nasal trauma, such as a broken nose or nasal  surgery.  Have nasal polyps.  What are the signs or symptoms? Symptoms of acute dacryocystitis start suddenly and may include:  Excessive tearing.  A matted, watery eye.  Swelling and redness over the lacrimal sac.  Discharge of mucus or pus into the eye. This may cause blurred vision.  Eye pain.  Fever.  Symptoms of chronic dacryocystitis usually include:  Excessive tearing.  Discharge of mucus or pus into the eye.  Blurred vision.  Redness, pain, and swelling are less common with chronic dacryocystitis. How is this diagnosed? This condition is diagnosed based on your medical history and a physical exam. During the exam, your health care provider may press between your eye and the side of your nose to see if discharge flows back into  your eye. You may also have tests, such as:  Removal of a sample of your eye or nose discharge to check for infection.  A dye disappearance test. During this test, your health care provider will put a yellow dye in your eye to see if the dye disappears from your eye. A swab may be placed in your nose to see if the dye drains to your nose.  Nasal endoscopy. For this test, a thin, lighted scope (endoscope) is placed in your nose to determine what is causing the duct blockage.  How is this treated? Acute dacryocystitis is treated with antibiotic medicines. These are usually given by mouth (orally), but they can also be given as eye drops or ointments. If the infection has spread to tissues around the eye (orbital cellulitis), antibiotics may be given through an IV tube. Chronic dacryocystitis usually needs to be treated with surgery. Surgical options include:  Probing the duct to open it.  Widening the duct.  Removing a nasal blockage.  Follow these instructions at home:  If directed by your health care provider, apply a clean warm compress to the inside corner of your eye. To do this: ? Wash your hands first. ? Hold the compress over the inside corner of your eye for a few minutes. ? Repeat this every few hours during the day.  Take or apply your antibiotic medicine, drops, or ointment as told by your health care provider. Do not stop taking or applying the antibiotic even if you start to feel better.  Take over-the-counter and prescription medicines only as told by your health care provider.  Keep all follow-up visits as told by your health care provider. This is important. Contact a health care provider if:  You have a fever.  Your symptoms come back, do not improve, or get worse. Get help right away if:  You have redness, swelling, and pain that spread to the tissues around your eye.  You have a sudden decrease in your vision. This information is not intended to replace advice  given to you by your health care provider. Make sure you discuss any questions you have with your health care provider. Document Released: 01/04/2000 Document Revised: 05/18/2015 Document Reviewed: 11/27/2014 Elsevier Interactive Patient Education  [employer].

## 2017-07-11 NOTE — Progress Notes (Signed)
Subjective:    Patient ID: [name], female    DOB: 04/13/1960, 57 y.o.   MRN: [id number] Chief Complaint  Patient presents with  . Ear Pain    pt states she has been having some drainage and itching in both ear for a while pt states   . Eye Problem    eye itching left eye for a few weeks with redness     HPI  Was seen by Timmothy Euler several times with ciprodex - works but comes back asa soon as stop, no dandruff, no seborrheic, no h/o eczema.  After stopping the drops it comes back for several days, the only time it has comes back is when she is on steroids and the ear and eyes clear for a couple eyes Left inner itchy loewr lid is red and puffy - eye will drain and itchy  Ears drain at night but not during day Doesn't have much access to healthcare at the moment -   In January failed vosol-hct otic   Past Medical History:  Diagnosis Date  . Allergy   . Asthma   . Depression   . Deviated septum   . Herpes   . HTN (hypertension)   . Hypothyroid   . Insomnia   . Neuromuscular disorder ([place])   . Thyroid cancer ([place]) 2009   Past Surgical History:  Procedure Laterality Date  . CESAREAN SECTION    . CHOLECYSTECTOMY  03/26/2006  . DILATION AND CURETTAGE OF UTERUS  06/30/2000  . THYROIDECTOMY     PARATHYROIDECTOMY   Current Outpatient Medications on File Prior to Visit  Medication Sig Dispense Refill  . acyclovir (ZOVIRAX) 200 MG capsule TAKE 2 CAPSULES BY MOUTH 2 TIMES DAILY. 360 capsule 3  . albuterol (VENTOLIN HFA) 108 (90 Base) MCG/ACT inhaler INHALE 2 PUFFS INTO THE LUNGS EVERY 4 (FOUR) HOURS AS NEEDED FOR WHEEZING. 18 Inhaler 2  . calcitRIOL (ROCALTROL) 0.25 MCG capsule TAKE 2 CAPSULES (0.5 MCG TOTAL) BY MOUTH DAILY. 180 capsule 1  . clonazePAM (KLONOPIN) 1 MG tablet Take 1 tablet (1 mg total) by mouth 3 (three) times daily as needed. 90 tablet 5  . levothyroxine (SYNTHROID, LEVOTHROID) 112 MCG tablet Take 1 tablet (112 mcg total) by mouth daily before breakfast. 90  tablet 1  . montelukast (SINGULAIR) 10 MG tablet TAKE 1 TABLET (10 MG TOTAL) BY MOUTH AT BEDTIME. 90 tablet 3  . olmesartan (BENICAR) 40 MG tablet Take 1 tablet (40 mg total) by mouth daily. 90 tablet 1  . oxyCODONE-acetaminophen (PERCOCET) 10-325 MG tablet Take 1 tablet by mouth every 6 (six) hours as needed for pain. 30 tablet 0  . rizatriptan (MAXALT) 10 MG tablet Take 1 tablet (10 mg total) by mouth as needed for migraine. May repeat in 2 hours if needed 12 tablet 11  . sertraline (ZOLOFT) 50 MG tablet Take 50 mg by mouth daily.    Marland Kitchen triamterene-hydrochlorothiazide (DYAZIDE) 37.5-25 MG capsule TAKE 1 CAPSULE BY MOUTH ONCE DAILY 30 capsule 0  . verapamil (CALAN-SR) 180 MG CR tablet Take 2 tablets (360 mg total) by mouth at bedtime. 180 tablet 3   No current facility-administered medications on file prior to visit.    Allergies  Allergen Reactions  . Lisinopril    Family History  Problem Relation Age of Onset  . Heart disease Father        CABG in his 54s  . Hyperlipidemia Father   . Heart disease Brother  CABG  . Heart disease Sister        Stents in 42s  . Hyperlipidemia Sister   . Heart disease Sister        CABG in 19s  . Hyperlipidemia Sister   . Hyperlipidemia Mother   . Breast cancer Maternal Aunt   . Hyperlipidemia Maternal Uncle   . Hyperlipidemia Maternal Grandmother   . Breast cancer Maternal Grandmother   . Hyperlipidemia Paternal Grandmother   . Breast cancer Paternal Grandmother    Social History   Socioeconomic History  . Marital status: Married    Spouse name: Micheal  . Number of children: 1  . Years of education: Not on file  . Highest education level: Not on file  Occupational History    Comment: Teach elementary school  Social Needs  . Financial resource strain: Not on file  . Food insecurity:    Worry: Not on file    Inability: Not on file  . Transportation needs:    Medical: Not on file    Non-medical: Not on file  Tobacco Use  .  Smoking status: Former Smoker    Packs/day: 2.00    Years: 20.00    Pack years: 40.00    Last attempt to quit: 05/13/1990    Years since quitting: 27.1  . Smokeless tobacco: Never Used  Substance and Sexual Activity  . Alcohol use: No    Comment: Rarely  . Drug use: No  . Sexual activity: Yes  Lifestyle  . Physical activity:    Days per week: Not on file    Minutes per session: Not on file  . Stress: Not on file  Relationships  . Social connections:    Talks on phone: Not on file    Gets together: Not on file    Attends religious service: Not on file    Active member of club or organization: Not on file    Attends meetings of clubs or organizations: Not on file    Relationship status: Not on file  Other Topics Concern  . Not on file  Social History Narrative    She is a Radio producer.  Has an 65-year-old child.  Does    not smoke cigarettes or drink alcohol.  She is married.         Depression screen [facility] 2/9 07/11/2017 02/12/2017 01/30/2017 11/19/2016 05/19/2016  Decreased Interest 0 0 0 0 0  Down, Depressed, Hopeless 0 0 0 0 0  PHQ - 2 Score 0 0 0 0 0      Review of Systems See hpi    Objective:   Physical Exam  Constitutional: She is oriented to person, place, and time. She appears well-developed and well-nourished. No distress.  HENT:  Head: Normocephalic and atraumatic.  Right Ear: Tympanic membrane and external ear normal. There is drainage.  Left Ear: Tympanic membrane and external ear normal. There is drainage.  Nose: Nose normal. No mucosal edema or rhinorrhea.  Mouth/Throat: Uvula is midline, oropharynx is clear and moist and mucous membranes are normal. No oropharyngeal exudate.  Bilateral canals with waxy flaking  Eyes: Pupils are equal, round, and reactive to light. EOM are normal. Right eye exhibits no chemosis, no discharge and no exudate. Left eye exhibits hordeolum. Left eye exhibits no chemosis, no discharge and no exudate. Right conjunctiva is not  injected. Right conjunctiva has no hemorrhage. Left conjunctiva is injected. Left conjunctiva has no hemorrhage. No scleral icterus.  Medial aspect of Left inner lower lid  with some mild nonspecific poorly defined erythema and edema  Neck: Normal range of motion. Neck supple.  Cardiovascular: Normal rate, regular rhythm, normal heart sounds and intact distal pulses.  Pulmonary/Chest: Effort normal and breath sounds normal.  Lymphadenopathy:    She has no cervical adenopathy.  Neurological: She is alert and oriented to person, place, and time.  Skin: Skin is warm and dry. She is not diaphoretic. No erythema.  Psychiatric: She has a normal mood and affect. Her behavior is normal.      BP (!) 158/80   Pulse 80   Temp 98.6 F (37 C) (Oral)   Resp 16   Ht 5' 5.35" (1.66 m)   Wt 192 lb (87.1 kg)   LMP 06/17/2002   SpO2 96%   BMI 31.61 kg/m   Assessment & Plan:  If fluocinolone works, ok to call for refill. 1. Essential hypertension   2. Obstruction of left lacrimal duct   3. Eczema of both external ears   4. Cervical disc disorder with radiculopathy of cervical region - cuases flairs of pain at times so has received oxycodone 10 for many years which she keeps at home in case of flaires and uses sparingly - not on regularly/scheduled, use has not increased over the years Cannot currently afford UDS    Orders Placed This Encounter  Procedures  . Care order/instruction:    Scheduling Instructions:     Recheck BP    Meds ordered this encounter  Medications  . Fluocinolone Acetonide 0.01 % OIL    Sig: Place 5 drops in ear(s) 2 (two) times daily.    Dispense:  20 mL    Refill:  0  . betamethasone dipropionate (DIPROLENE) 0.05 % cream    Sig: Apply topically 2 (two) times daily.    Dispense:  45 g    Refill:  0  . azelastine (OPTIVAR) 0.05 % ophthalmic solution    Sig: Place 1 drop into both eyes 2 (two) times daily.    Dispense:  6 mL    Refill:  1  . clonazePAM (KLONOPIN) 1  MG tablet    Sig: Take 1 tablet (1 mg total) by mouth 3 (three) times daily as needed.    Dispense:  90 tablet    Refill:  5    This request is for a new prescription for a controlled substance as required by Federal/State law..  . oxyCODONE-acetaminophen (PERCOCET) 10-325 MG tablet    Sig: Take 1 tablet by mouth every 6 (six) hours as needed for pain.    Dispense:  30 tablet    Refill:  0    Chronic pain syndrome  . rizatriptan (MAXALT) 10 MG tablet    Sig: Take 1 tablet (10 mg total) by mouth as needed for migraine. May repeat in 2 hours if needed    Dispense:  12 tablet    Refill:  11  . verapamil (CALAN-SR) 180 MG CR tablet    Sig: Take 2 tablets (360 mg total) by mouth at bedtime.    Dispense:  180 tablet    Refill:  3   Today I have utilized the  Controlled Substance Registry's online query to confirm compliance regarding the patient's controlled medications. My review reveals appropriate prescription fills and that I am the sole provider of these medications. Rechecks will occur regularly and the patient is aware of our use of the system.  Delman Cheadle, MD, MPH [facility] at [place]  [address] [place], [place]  [address] [phone] Office phone  [phone] Office fax   10/20/17 5:19 AM

## 2017-07-19 ENCOUNTER — Other Ambulatory Visit: Payer: Self-pay | Admitting: Family Medicine

## 2017-10-04 ENCOUNTER — Other Ambulatory Visit: Payer: Self-pay | Admitting: Family Medicine

## 2017-10-05 ENCOUNTER — Telehealth: Payer: Self-pay | Admitting: Family Medicine

## 2017-10-05 NOTE — Telephone Encounter (Signed)
Copied from [place] [id number]. Topic: General - Other >> Oct 05, 2017 12:09 PM Valla Leaver wrote: Reason for CRM: [facility] calling for last office notes with Brigitte Pulse where blocked tear ducts were discussed. They need to be able to figure which doctor to place her with and how soon. Fax:[phone]

## 2017-10-05 NOTE — Telephone Encounter (Signed)
Azelastine HCL 0.05% drops  refill Last Refill:07/11/17 # 6 ml Last OV: 07/11/17 PCP: E. Brigitte Pulse Pharmacy:[facility] [phone]

## 2017-10-13 NOTE — Telephone Encounter (Signed)
Notes faxed successful

## 2017-10-14 NOTE — Telephone Encounter (Signed)
[facility] states they do not have the notes and pt is there now.  Can you refax? Thanks Fax  [phone]

## 2017-10-14 NOTE — Telephone Encounter (Signed)
[facility] Dy ctates they found the fax.  Nothing further needed.

## 2017-10-19 ENCOUNTER — Other Ambulatory Visit: Payer: Self-pay | Admitting: Family Medicine

## 2017-10-19 NOTE — Telephone Encounter (Signed)
Okay to refill? 

## 2017-11-07 ENCOUNTER — Other Ambulatory Visit: Payer: Self-pay | Admitting: Family Medicine

## 2017-11-07 DIAGNOSIS — M5010 Cervical disc disorder with radiculopathy, unspecified cervical region: Secondary | ICD-10-CM

## 2017-11-08 NOTE — Telephone Encounter (Signed)
Refill req sent to Dr. Brigitte Pulse Percocet refill req.

## 2017-11-09 MED ORDER — OXYCODONE-ACETAMINOPHEN 10-325 MG PO TABS: 1.0000 | ORAL_TABLET | Freq: Four times a day (QID) | ORAL | 0 refills | Status: DC | PRN

## 2017-11-09 NOTE — Addendum Note (Signed)
Addended by: [name] on: 11/09/2017 11:03 PM   Modules accepted: Orders

## 2017-12-12 ENCOUNTER — Other Ambulatory Visit: Payer: Self-pay | Admitting: Family Medicine

## 2017-12-14 NOTE — Telephone Encounter (Signed)
Requested Prescriptions  Signed Prescriptions Disp Refills   albuterol (PROVENTIL HFA;VENTOLIN HFA) 108 (90 Base) MCG/ACT inhaler 18 Inhaler 2    Sig: TAKE 2 PUFFS BY MOUTH EVERY 4 HOURS AS NEEDED FOR WHEEZE     Pulmonology:  Beta Agonists Failed - 12/12/2017  1:22 PM      Failed - One inhaler should last at least one month. If the patient is requesting refills earlier, contact the patient to check for uncontrolled symptoms.      Passed - Valid encounter within last 12 months    Recent Outpatient Visits          5 months ago Essential hypertension   [facility], Laurey Arrow, MD   10 months ago Cough   [facility] at [place], [place] D, PA-C   10 months ago Fatigue, unspecified type   [facility] at [facility], [place] D, PA-C   1 year ago Urinary frequency   [facility], Laurey Arrow, MD   1 year ago Urinary urgency   [facility], Laurey Arrow, MD      Future Appointments            In 4 weeks [name], MD [facility] at [place], [facility]          montelukast (SINGULAIR) 10 MG tablet 90 tablet 3    Sig: TAKE 1 TABLET (10 MG TOTAL) BY MOUTH AT BEDTIME.     Pulmonology:  Leukotriene Inhibitors Passed - 12/12/2017  1:22 PM      Passed - Valid encounter within last 12 months    Recent Outpatient Visits          5 months ago Essential hypertension   [facility], Laurey Arrow, MD   10 months ago Cough   [facility] at [place], [place] D, PA-C   10 months ago Fatigue, unspecified type   Primary Care at [facility], [place] D, PA-C   1 year ago Urinary frequency   [facility], Laurey Arrow, MD   1 year ago Urinary urgency   [facility], Laurey Arrow, MD      Future Appointments            In 4 weeks [name], MD Primary [facility] at [place], [place]  . rizatriptan (MAXALT) 10 MG tablet [Pharmacy Med Name: RIZATRIPTAN 10 MG TABLET] 9 tablet 15    Sig:  TAKE 1 TABLET (10 MG TOTAL) BY MOUTH AS NEEDED FOR MIGRAINE. MAY REPEAT IN 2 HOURS IF NEEDED     Neurology:  Migraine Therapy - Triptan Failed - 12/12/2017  1:22 PM      Failed - Last BP in normal range    BP Readings from Last 1 Encounters:  07/11/17 (!) 158/80         Passed - Valid encounter within last 12 months    Recent Outpatient Visits          5 months ago Essential hypertension   [facility], Laurey Arrow, MD   10 months ago Cough   [facility] at [place], [place] D, PA-C   10 months ago Fatigue, unspecified type   [facility] at [place], [place] D, PA-C   1 year ago Urinary frequency   [facility], Laurey Arrow, MD  1 year ago Urinary urgency   Primary Care at [place], MD      Future Appointments            In 4 weeks [name], MD Primary [facility] at [place], [facility]

## 2017-12-14 NOTE — Telephone Encounter (Signed)
LOV 07/11/17 NOV  01/12/18 Requested Prescriptions  Pending Prescriptions Disp Refills  . rizatriptan (MAXALT) 10 MG tablet [Pharmacy Med Name: RIZATRIPTAN 10 MG TABLET] 9 tablet 15    Sig: TAKE 1 TABLET (10 MG TOTAL) BY MOUTH AS NEEDED FOR MIGRAINE. MAY REPEAT IN 2 HOURS IF NEEDED     Neurology:  Migraine Therapy - Triptan Failed - 12/12/2017  1:22 PM      Failed - Last BP in normal range    BP Readings from Last 1 Encounters:  07/11/17 (!) 158/80         Passed - Valid encounter within last 12 months    Recent Outpatient Visits          5 months ago Essential hypertension   [facility], Laurey Arrow, MD   10 months ago Cough   [facility] at [place], [place] D, PA-C   10 months ago Fatigue, unspecified type   Primary Care at [facility], [place] D, PA-C   1 year ago Urinary frequency   [facility], Laurey Arrow, MD   1 year ago Urinary urgency   [facility], Laurey Arrow, MD      Future Appointments            In 4 weeks [name], MD [facility] at [place], [facility]         . albuterol (PROVENTIL HFA;VENTOLIN HFA) 108 (90 Base) MCG/ACT inhaler [Pharmacy Med Name: ALBUTEROL HFA (VENTOLIN) INH] 18 Inhaler 2    Sig: TAKE 2 PUFFS BY MOUTH EVERY 4 HOURS AS NEEDED FOR WHEEZE     Pulmonology:  Beta Agonists Failed - 12/12/2017  1:22 PM      Failed - One inhaler should last at least one month. If the patient is requesting refills earlier, contact the patient to check for uncontrolled symptoms.      Passed - Valid encounter within last 12 months    Recent Outpatient Visits          5 months ago Essential hypertension   [facility], Laurey Arrow, MD   10 months ago Cough   [facility] at [place], [place] D, PA-C   10 months ago Fatigue, unspecified type   Primary Care at [facility], [place] D, PA-C   1 year ago Urinary frequency   [facility], Laurey Arrow, MD   1 year ago Urinary urgency   [facility] at [facility], Laurey Arrow, MD      Future Appointments            In 4 weeks [name], MD Primary Care at [place], [place]         . montelukast ([place]) 10 MG tablet [Pharmacy Med Name: MONTELUKAST SOD 10 MG TABLET] 90 tablet 3    Sig: TAKE 1 TABLET (10 MG TOTAL) BY MOUTH AT BEDTIME.     Pulmonology:  Leukotriene Inhibitors Passed - 12/12/2017  1:22 PM      Passed - Valid encounter within last 12 months    Recent Outpatient Visits          5 months ago Essential hypertension   [facility] at [place], MD   10 months ago Cough   [facility] at [place], [place] D, PA-C   10 months ago Fatigue, unspecified type   Primary Care at [facility], [place] D, PA-C   1  year ago Urinary frequency   [facility], Laurey Arrow, MD   1 year ago Urinary urgency   [facility] at [facility], Laurey Arrow, MD      Future Appointments            In 4 weeks [name], MD Primary Care at [place], [facility]

## 2017-12-21 ENCOUNTER — Other Ambulatory Visit: Payer: Self-pay

## 2017-12-21 MED ORDER — RIZATRIPTAN BENZOATE 10 MG PO TABS: 10.0000 mg | ORAL_TABLET | ORAL | 11 refills | Status: DC | PRN

## 2017-12-23 IMAGING — CT CT ABD-PELV W/O CM
1 of 2 series · 15 of 32 positions shown, 19 images · non-contrast
Comparison: CT abdomen dated 04/06/2006.

CLINICAL DATA: History of kidney stones. Urinary frequency, gross
hematuria and left flank pain.

EXAM:
CT ABDOMEN AND PELVIS WITHOUT CONTRAST
TECHNIQUE: Multidetector CT imaging of the abdomen and pelvis was performed
following the standard protocol without IV contrast.

[Series 2: renal standard/full · axial · 0.88mm/px · z∈[+243,+648]mm · 15 of 89 slices shown, 19 images]
[im 4/89  soft-tissue]
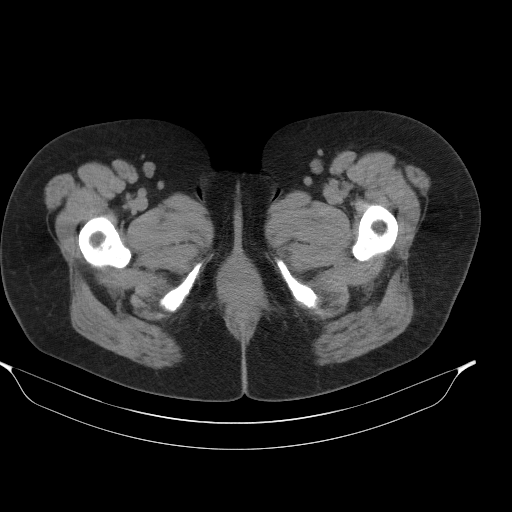
[im 4/89  bone]
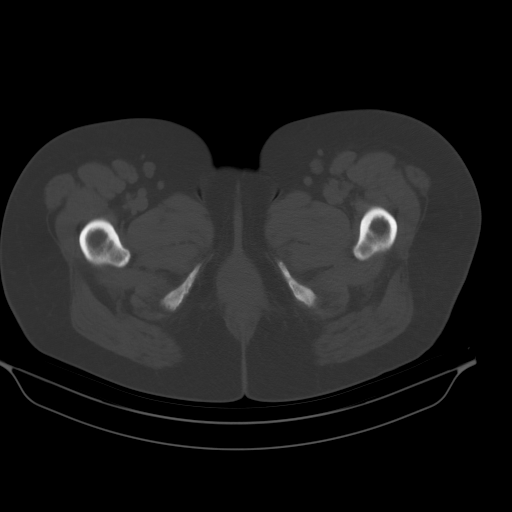
[im 12/89  soft-tissue]
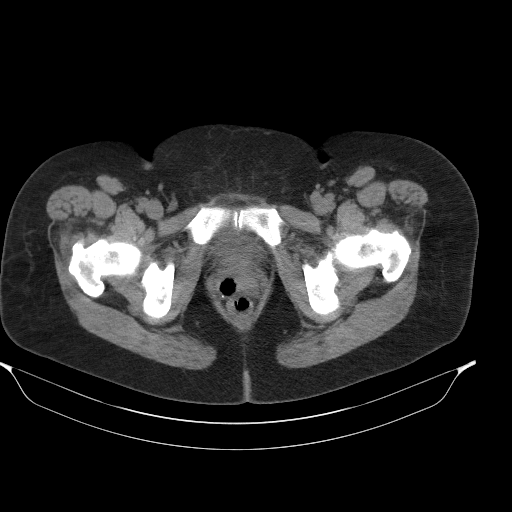
[im 19/89  soft-tissue]
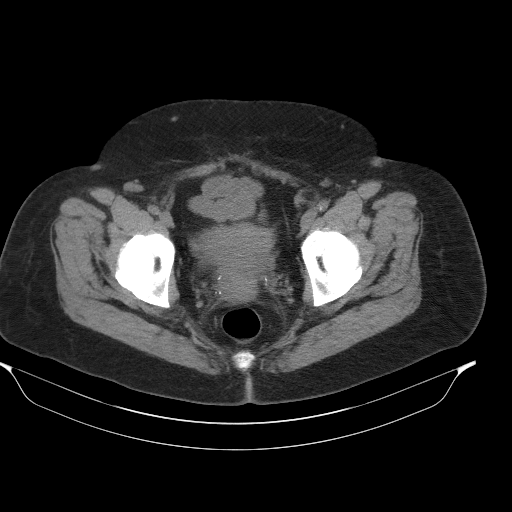
[im 26/89  soft-tissue]
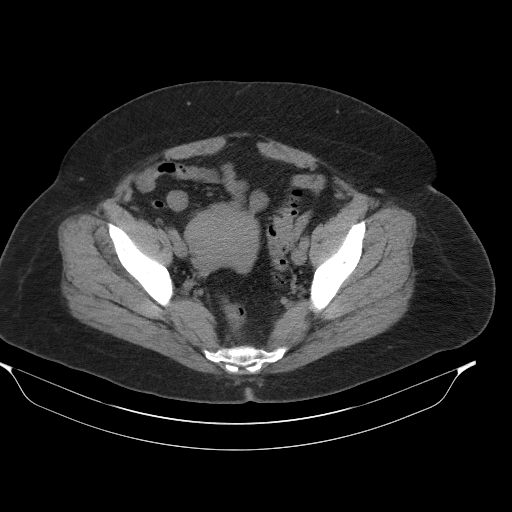
[im 30/89  soft-tissue]
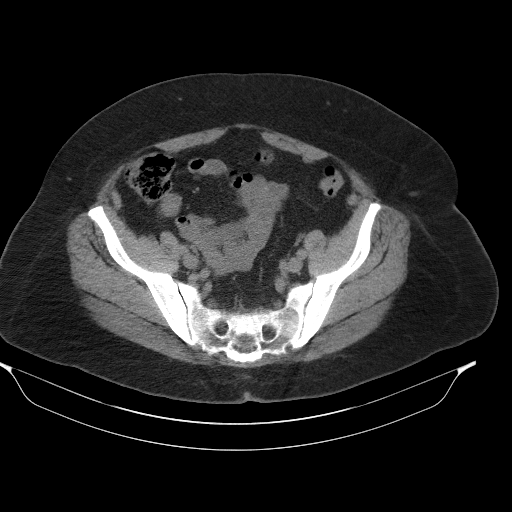
[im 37/89  soft-tissue]
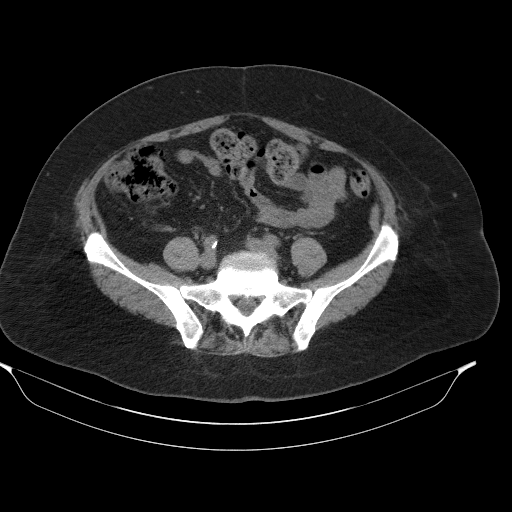
[im 45/89  soft-tissue]
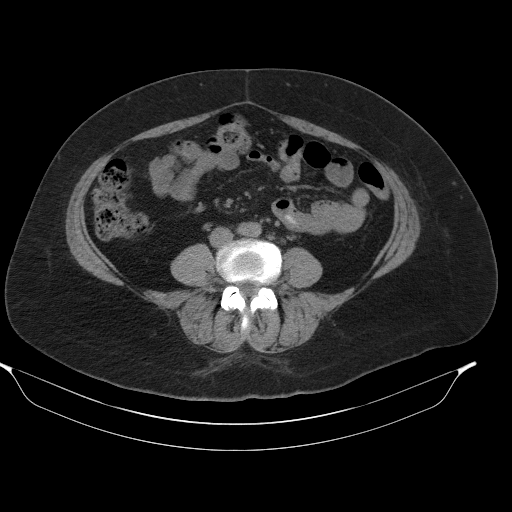
[im 52/89  soft-tissue]
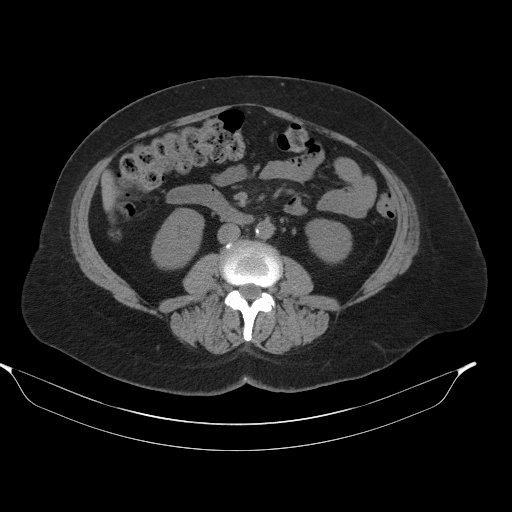
[im 59/89  soft-tissue]
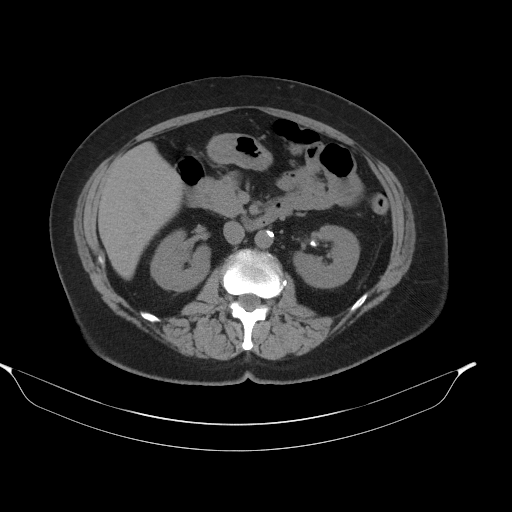
[im 59/89  bone]
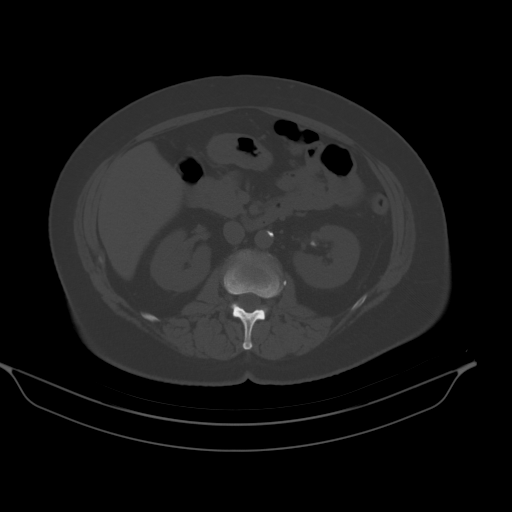
[im 63/89  soft-tissue]
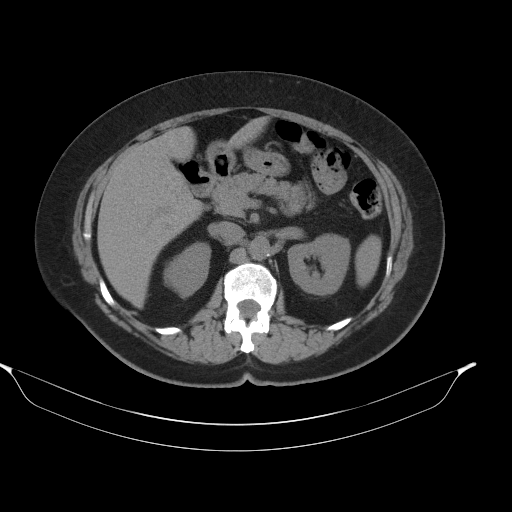
[im 70/89  soft-tissue]
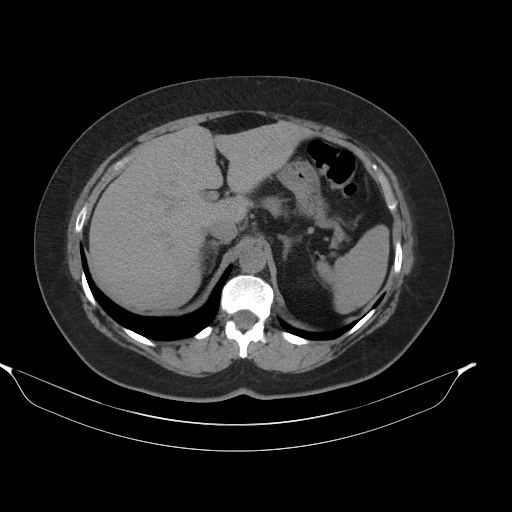
[im 74/89  lung]
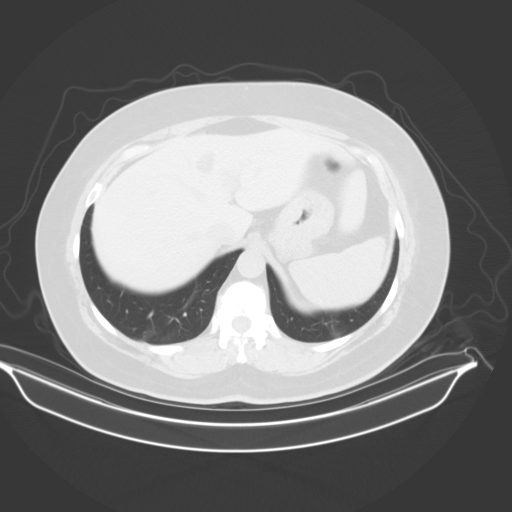
[im 78/89  soft-tissue]
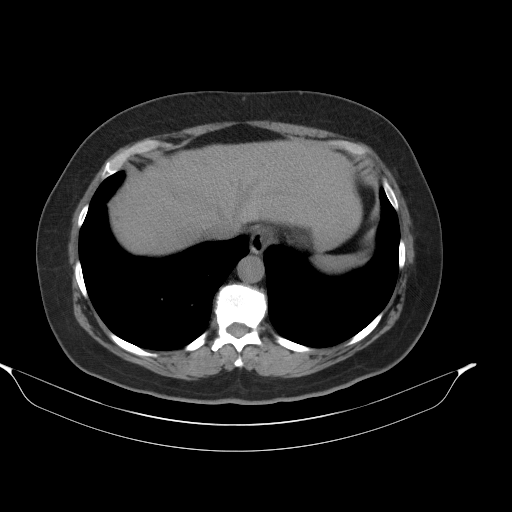
[im 78/89  lung]
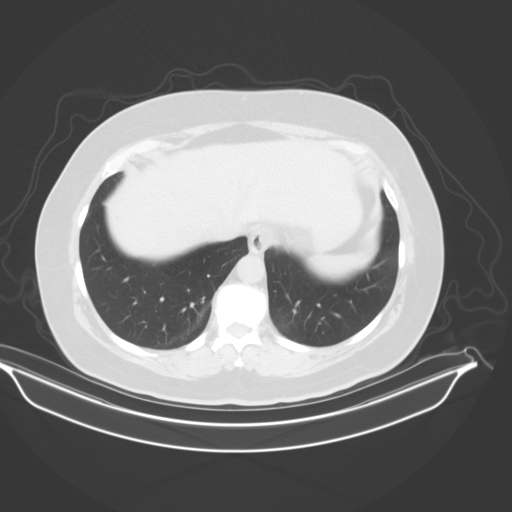
[im 81/89  lung]
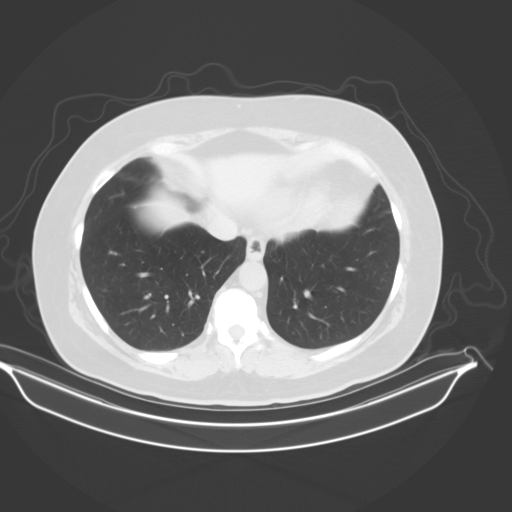
[im 85/89  soft-tissue]
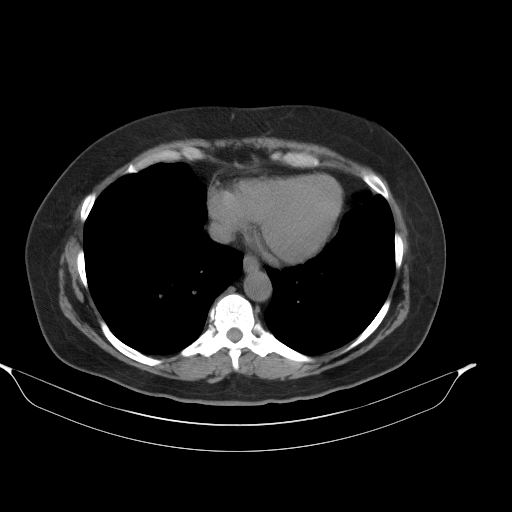
[im 85/89  lung]
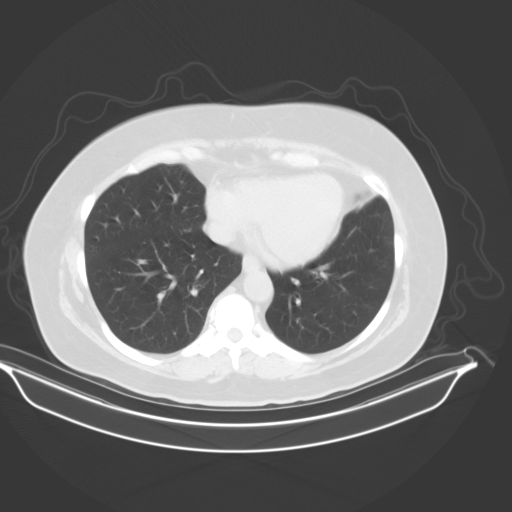

[15 of 32 positions shown; findings below may reference images not displayed]

FINDINGS: Lower chest: No acute abnormality.

Hepatobiliary: Left liver lobe cyst. No suspicious mass or lesion
within the liver. Status post cholecystectomy. No bile duct
dilatation.

Pancreas: Unremarkable. No pancreatic ductal dilatation or
surrounding inflammatory changes.

Spleen: Normal in size without focal abnormality.

Adrenals/Urinary Tract: Adrenal glands appear normal.

7 mm nonobstructing left renal stone, central renal pelvis. Several
additional left-sided stones within the lower pole renal pelvis,
largest measuring 7 mm (although possibly 2 smaller abutting
stones). No hydronephrosis. No right-sided renal stone.

No ureteral or bladder calculi identified. Bladder appears normal,
partially decompressed.

Stomach/Bowel: Bowel is normal in caliber. No bowel wall thickening
or evidence of bowel wall inflammation. Appendix is normal. Stomach
is unremarkable, decompressed.

Vascular/Lymphatic: Aortic atherosclerosis. No enlarged lymph nodes
seen in the abdomen or pelvis.

Reproductive: Probable fibroids within the uterus. Adnexal regions
are unremarkable.

Other: No free fluid or abscess collection. No free intraperitoneal
air.

Musculoskeletal: Mild degenerative change within the slightly
scoliotic thoracolumbar spine. No acute or suspicious osseous
finding.
IMPRESSION: 1. No acute findings.
2. Left nephrolithiasis, largest renal stone measuring 7 mm. No
associated hydronephrosis. No ureteral or bladder calculi.
3. Suspect uterine fibroid, possibly multiple.
4. Aortic atherosclerosis.

## 2018-01-12 ENCOUNTER — Encounter: Payer: Self-pay | Admitting: Family Medicine

## 2018-01-12 ENCOUNTER — Ambulatory Visit: Payer: Self-pay | Admitting: Family Medicine

## 2018-01-12 ENCOUNTER — Other Ambulatory Visit: Payer: Self-pay

## 2018-01-12 VITALS — BP 138/78 | HR 82 | Temp 98.0°F | Resp 16 | Ht 65.75 in | Wt 196.0 lb

## 2018-01-12 DIAGNOSIS — M5010 Cervical disc disorder with radiculopathy, unspecified cervical region: Secondary | ICD-10-CM

## 2018-01-12 DIAGNOSIS — Z20828 Contact with and (suspected) exposure to other viral communicable diseases: Secondary | ICD-10-CM

## 2018-01-12 DIAGNOSIS — E890 Postprocedural hypothyroidism: Secondary | ICD-10-CM

## 2018-01-12 DIAGNOSIS — J0100 Acute maxillary sinusitis, unspecified: Secondary | ICD-10-CM

## 2018-01-12 MED ORDER — CLONAZEPAM 1 MG PO TABS: 1.0000 mg | ORAL_TABLET | Freq: Three times a day (TID) | ORAL | 0 refills | Status: DC | PRN

## 2018-01-12 MED ORDER — AMOXICILLIN 500 MG PO CAPS: 1000.0000 mg | ORAL_CAPSULE | Freq: Three times a day (TID) | ORAL | 0 refills | Status: DC

## 2018-01-12 MED ORDER — ACYCLOVIR 200 MG PO CAPS: ORAL_CAPSULE | ORAL | 3 refills | Status: DC

## 2018-01-12 MED ORDER — CALCITRIOL 0.25 MCG PO CAPS: ORAL_CAPSULE | ORAL | 0 refills | Status: DC

## 2018-01-12 MED ORDER — OSELTAMIVIR PHOSPHATE 75 MG PO CAPS: 75.0000 mg | ORAL_CAPSULE | Freq: Two times a day (BID) | ORAL | 0 refills | Status: DC

## 2018-01-12 MED ORDER — OLMESARTAN MEDOXOMIL 20 MG PO TABS: 20.0000 mg | ORAL_TABLET | Freq: Every day | ORAL | 0 refills | Status: DC

## 2018-01-12 MED ORDER — AZELASTINE HCL 0.05 % OP SOLN: OPHTHALMIC | 0 refills | Status: DC

## 2018-01-12 MED ORDER — FLUOCINOLONE ACETONIDE 0.01 % OT OIL: 5.0000 [drp] | TOPICAL_OIL | Freq: Two times a day (BID) | OTIC | 0 refills | Status: DC

## 2018-01-12 MED ORDER — LEVOTHYROXINE SODIUM 137 MCG PO TABS: 137.0000 ug | ORAL_TABLET | Freq: Every day | ORAL | 0 refills | Status: DC

## 2018-01-12 MED ORDER — TRIAMTERENE-HCTZ 37.5-25 MG PO CAPS: 1.0000 | ORAL_CAPSULE | Freq: Every day | ORAL | 0 refills | Status: DC

## 2018-01-12 MED ORDER — OXYCODONE-ACETAMINOPHEN 10-325 MG PO TABS: 1.0000 | ORAL_TABLET | Freq: Four times a day (QID) | ORAL | 0 refills | Status: DC | PRN

## 2018-01-12 NOTE — Progress Notes (Signed)
Subjective:    Patient: [name]  DOB: 1960/02/10; 57 y.o.   MRN: [id number]  Chief Complaint  Patient presents with  . Hypertension  . Hyperlipidemia   HPI Now working so hoping that she will get ins soon so working for [employer] in [employer].   HTN: low salt diet. Uncontrolled. Does have a med box. Does have a BP cuff at work and is running about 142/82-90. Lisinopril caused severe cramping.  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 She saw Dr. Julien Nordmann at [facility] [place] and they did lots of testing and tried to level her thyroid and she did not notice any difference. On the 112 of levothyroxine mcg. They did try her on armor 90mg  but she was not able to find it. She was on some Nature's supp as well. Not sure if she was tried on brand name Synthroid. She can't remember all of what she tried but feels like it was everything. Has gone to support groups for people who have survived thyroid cancer but it was more depressing as everyone was on disability and not doing real well.  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 Short-term memory loss which she noted after complete thyroidectomy. Her whole family has noticed the change. Has not changed since it developed around 2010. She has not seen neurology and does not know much about her family hx. She is currently in a MSW program which has made some complications.  [name] rxs her prn adderall 20mg  bid which she uses in situations where prolonged attention is needed. Depression: followed at [facility] in [place]. She is on zoloft by [facility]. Effoxor caused severe nausea and illness if she even missed one dose.  Panic attacks: rare, <1/mo. She takes a klonopin when she has a panic attack. Does not like to take it as it affects her short term memory. Obesity: Starting by walking  30 minutes a day. She has lost 45 lbs by walking every day.  Migraines: Takes maxalt prn about once a month, triggered by stress. Usu frontal HAs with photophobia/phonophobia/ and nausea. 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 Seasonal allergies: Uses ventolin prn for flairs. Pro-air doesn't work well for her. Stays on claritin with singulair.  Always present - thinks the maxillary left side may be infected as it has been tender for about 2 weeks. Using the netti pot which isn't getting a lot out as she has a deviated septum to the left. No tooth/gum pain, no ear pain. No f/c, no coughing. Has been exposed to the flu.  Post-menopausal: Has been on estradiol 1mg  from Dr. Toney Rakes but feels like it is pointless. 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 Followed by Dr. Jacelyn Grip at [place] for occasional cortisone injections. Uses rare prn oxycodone which is only refilled 2-3x a year.Very rare for her to take 2 in a day - usually just 1 every couple of days. The tramadol does not work as well.  The injections are still effective but uses the oxycodone between those periods -   Medical History Past Medical History:  Diagnosis Date  . Allergy   . Asthma   . Depression   . Deviated septum   . Herpes   . HTN (hypertension)   .  Hypothyroid   . Insomnia   . Neuromuscular disorder ([place])   . Thyroid cancer ([place]) 2009   Past Surgical History:  Procedure Laterality Date  . CESAREAN SECTION    . CHOLECYSTECTOMY  03/26/2006  . DILATION AND CURETTAGE OF UTERUS  06/30/2000  . THYROIDECTOMY     PARATHYROIDECTOMY   Current Outpatient Medications on File Prior to Visit  Medication Sig Dispense Refill  . acyclovir (ZOVIRAX) 200 MG capsule TAKE 2 CAPSULES BY MOUTH 2 TIMES DAILY. 360 capsule 3  . albuterol (PROVENTIL HFA;VENTOLIN HFA) 108 (90 Base) MCG/ACT inhaler TAKE 2 PUFFS BY  MOUTH EVERY 4 HOURS AS NEEDED FOR WHEEZE 18 Inhaler 2  . amphetamine-dextroamphetamine (ADDERALL) 20 MG tablet dextroamphetamine-amphetamine 20 mg tablet  TAKE 1 TABLET TWICE A DAY    . azelastine (OPTIVAR) 0.05 % ophthalmic solution PLACE ONE DROP INTO EACH EYE TWICE A DAY 6 mL 0  . betamethasone dipropionate (DIPROLENE) 0.05 % cream Apply topically 2 (two) times daily. 45 g 0  . calcitRIOL (ROCALTROL) 0.25 MCG capsule TAKE 2 CAPSULES (0.5 MCG TOTAL) BY MOUTH DAILY. 180 capsule 1  . clonazePAM (KLONOPIN) 1 MG tablet Take 1 tablet (1 mg total) by mouth 3 (three) times daily as needed. 90 tablet 5  . Fluocinolone Acetonide 0.01 % OIL Place 5 drops in ear(s) 2 (two) times daily. 20 mL 0  . levothyroxine (SYNTHROID, LEVOTHROID) 112 MCG tablet Take 1 tablet (112 mcg total) by mouth daily before breakfast. 90 tablet 1  . montelukast (SINGULAIR) 10 MG tablet TAKE 1 TABLET (10 MG TOTAL) BY MOUTH AT BEDTIME. 90 tablet 3  . olmesartan (BENICAR) 40 MG tablet Take 1 tablet (40 mg total) by mouth daily. 90 tablet 1  . oxyCODONE-acetaminophen (PERCOCET) 10-325 MG tablet Take 1 tablet by mouth every 6 (six) hours as needed for pain. 30 tablet 0  . rizatriptan (MAXALT) 10 MG tablet Take 1 tablet (10 mg total) by mouth as needed for migraine. May repeat in 2 hours if needed 12 tablet 11  . sertraline (ZOLOFT) 50 MG tablet Take 50 mg by mouth daily.    Marland Kitchen triamterene-hydrochlorothiazide (DYAZIDE) 37.5-25 MG capsule TAKE 1 CAPSULE BY MOUTH ONCE DAILY 90 capsule 0  . verapamil (CALAN-SR) 180 MG CR tablet Take 2 tablets (360 mg total) by mouth at bedtime. 180 tablet 3   No current facility-administered medications on file prior to visit.    Allergies  Allergen Reactions  . Lisinopril    Family History  Problem Relation Age of Onset  . Heart disease Father        CABG in his 79s  . Hyperlipidemia Father   . Heart disease Brother        CABG  . Heart disease Sister        Stents in 81s  . Hyperlipidemia  Sister   . Heart disease Sister        CABG in 64s  . Hyperlipidemia Sister   . Hyperlipidemia Mother   . Breast cancer Maternal Aunt   . Hyperlipidemia Maternal Uncle   . Hyperlipidemia Maternal Grandmother   . Breast cancer Maternal Grandmother   . Hyperlipidemia Paternal Grandmother   . Breast cancer Paternal Grandmother    Social History   Socioeconomic History  . Marital status: Married    Spouse name: Micheal  . Number of children: 1  . Years of education: Not on file  . Highest education level: Not on file  Occupational History    Comment: Teach  elementary school  Social Needs  . Financial resource strain: Not on file  . Food insecurity:    Worry: Not on file    Inability: Not on file  . Transportation needs:    Medical: Not on file    Non-medical: Not on file  Tobacco Use  . Smoking status: Former Smoker    Packs/day: 2.00    Years: 20.00    Pack years: 40.00    Last attempt to quit: 05/13/1990    Years since quitting: 27.6  . Smokeless tobacco: Never Used  Substance and Sexual Activity  . Alcohol use: No    Comment: Rarely  . Drug use: No  . Sexual activity: Yes  Lifestyle  . Physical activity:    Days per week: Not on file    Minutes per session: Not on file  . Stress: Not on file  Relationships  . Social connections:    Talks on phone: Not on file    Gets together: Not on file    Attends religious service: Not on file    Active member of club or organization: Not on file    Attends meetings of clubs or organizations: Not on file    Relationship status: Not on file  Other Topics Concern  . Not on file  Social History Narrative    She is a Radio producer.  Has an 51-year-old child.  Does    not smoke cigarettes or drink alcohol.  She is married.         Depression screen [facility] 2/9 07/11/2017 02/12/2017 01/30/2017 11/19/2016 05/19/2016  Decreased Interest 0 0 0 0 0  Down, Depressed, Hopeless 0 0 0 0 0  PHQ - 2 Score 0 0 0 0 0    ROS As noted in  HPI  Objective:  BP 138/78   Pulse 82   Temp 98 F (36.7 C) (Oral)   Resp 16   Ht 5' 5.75" (1.67 m)   Wt 196 lb (88.9 kg)   LMP 06/17/2002   SpO2 96%   BMI 31.88 kg/m  Physical Exam Constitutional:      General: She is not in acute distress.    Appearance: She is well-developed. She is not diaphoretic.  HENT:     Head: Normocephalic and atraumatic.     Right Ear: External ear normal.     Left Ear: External ear normal.  Eyes:     General: No scleral icterus.    Conjunctiva/sclera: Conjunctivae normal.  Neck:     Musculoskeletal: Normal range of motion and neck supple.     Thyroid: No thyromegaly.  Cardiovascular:     Rate and Rhythm: Normal rate and regular rhythm.     Heart sounds: Normal heart sounds.  Pulmonary:     Effort: Pulmonary effort is normal. No respiratory distress.     Breath sounds: Normal breath sounds.  Lymphadenopathy:     Cervical: No cervical adenopathy.  Skin:    General: Skin is warm and dry.     Findings: No erythema.  Neurological:     Mental Status: She is alert and oriented to person, place, and time.  Psychiatric:        Behavior: Behavior normal.    Assessment & Plan:   1. Hypothyroidism, postsurgical   2. Cervical disc disorder with radiculopathy of cervical region   3. Acute non-recurrent maxillary sinusitis   4. Exposure to influenza    Pt is self-pay but started at full-time job so plans to  have a health insurance next wk after the beginning of the new year.  Short-term refills of chronic meds supplied, no changes. And pt will RTC in 1-2 mos for CPE and labs at which time we can more fully refill meds.  Patient will continue on current chronic medications other than changes noted above, so ok to refill when needed.   See after visit summary for patient specific instructions.  Orders Placed This Encounter  Procedures  . [id number]    Standing Status:   Future    Standing Expiration Date:   01/13/2019    Meds ordered  this encounter  Medications  . triamterene-hydrochlorothiazide (DYAZIDE) 37.5-25 MG capsule    Sig: Take 1 each (1 capsule total) by mouth daily.    Dispense:  90 capsule    Refill:  0  . oxyCODONE-acetaminophen (PERCOCET) 10-325 MG tablet    Sig: Take 1 tablet by mouth every 6 (six) hours as needed for pain.    Dispense:  30 tablet    Refill:  0    Chronic pain syndrome  . olmesartan (BENICAR) 20 MG tablet    Sig: Take 1 tablet (20 mg total) by mouth daily.    Dispense:  90 tablet    Refill:  0  . levothyroxine (SYNTHROID, LEVOTHROID) 137 MCG tablet    Sig: Take 1 tablet (137 mcg total) by mouth daily before breakfast.    Dispense:  90 tablet    Refill:  0  . Fluocinolone Acetonide 0.01 % OIL    Sig: Place 5 drops in ear(s) 2 (two) times daily.    Dispense:  20 mL    Refill:  0  . clonazePAM (KLONOPIN) 1 MG tablet    Sig: Take 1 tablet (1 mg total) by mouth 3 (three) times daily as needed.    Dispense:  90 tablet    Refill:  0    This request is for a new prescription for a controlled substance as required by Federal/State law..  . calcitRIOL (ROCALTROL) 0.25 MCG capsule    Sig: TAKE 2 CAPSULES (0.5 MCG TOTAL) BY MOUTH DAILY.    Dispense:  180 capsule    Refill:  0  . acyclovir (ZOVIRAX) 200 MG capsule    Sig: TAKE 2 CAPSULES BY MOUTH 2 TIMES DAILY.    Dispense:  360 capsule    Refill:  3  . azelastine (OPTIVAR) 0.05 % ophthalmic solution    Sig: PLACE ONE DROP INTO EACH EYE TWICE A DAY    Dispense:  6 mL    Refill:  0  . amoxicillin (AMOXIL) 500 MG capsule    Sig: Take 2 capsules (1,000 mg total) by mouth 3 (three) times daily.    Dispense:  60 capsule    Refill:  0  . oseltamivir (TAMIFLU) 75 MG capsule    Sig: Take 1 capsule (75 mg total) by mouth 2 (two) times daily.    Dispense:  10 capsule    Refill:  0    Patient verbalized to me that they understand the following: diagnosis, what is being done for them, what to expect and what should be done at home.  Their  questions have been answered. They understand that I am unable to predict every possible medication interaction or adverse outcome and that if any unexpected symptoms arise, they should contact us and their pharmacist, as well as never hesitate to seek urgent/emergent care at [facility] or ER if they think it might be  warranted.    Delman Cheadle, MD, MPH Primary Care at [place] [address] [place], [place]  [phone] Office phone  [phone] Office fax  01/12/18 8:34 AM

## 2018-01-12 NOTE — Patient Instructions (Signed)
° ° ° °  If you have lab work done today you will be contacted with your lab results within the next 2 weeks.  If you have not heard from us then please contact us. The fastest way to get your results is to register for My Chart. ° ° °IF you received an x-ray today, you will receive an invoice from [facility]. Please contact [facility] at [phone] with questions or concerns regarding your invoice.  ° °IF you received labwork today, you will receive an invoice from [facility]. Please contact [facility] at [phone] with questions or concerns regarding your invoice.  ° °Our billing staff will not be able to assist you with questions regarding bills from these companies. ° °You will be contacted with the lab results as soon as they are available. The fastest way to get your results is to activate your My Chart account. Instructions are located on the last page of this paperwork. If you have not heard from us regarding the results in 2 weeks, please contact this office. °  ° ° ° °

## 2018-01-28 ENCOUNTER — Other Ambulatory Visit: Payer: Self-pay | Admitting: Family Medicine

## 2018-02-05 ENCOUNTER — Telehealth: Payer: Self-pay | Admitting: Family Medicine

## 2018-02-05 NOTE — Telephone Encounter (Signed)
MyChart message sent to pt about their appointment on 02/24/18 with Dr Brigitte Pulse.

## 2018-02-24 ENCOUNTER — Encounter: Payer: Self-pay | Admitting: Family Medicine

## 2018-04-11 ENCOUNTER — Other Ambulatory Visit: Payer: Self-pay

## 2018-04-11 ENCOUNTER — Encounter: Payer: Self-pay | Admitting: Emergency Medicine

## 2018-04-11 ENCOUNTER — Emergency Department: Payer: Self-pay

## 2018-04-11 ENCOUNTER — Inpatient Hospital Stay
Admission: EM | Admit: 2018-04-11 | Discharge: 2018-04-13 | DRG: 379 | Disposition: A | Discharge status: Home / Self Care | Payer: Self-pay | Attending: Internal Medicine | Admitting: Internal Medicine

## 2018-04-11 DIAGNOSIS — K254 Chronic or unspecified gastric ulcer with hemorrhage: Principal | ICD-10-CM | POA: Diagnosis present

## 2018-04-11 DIAGNOSIS — E876 Hypokalemia: Secondary | ICD-10-CM | POA: Diagnosis present

## 2018-04-11 DIAGNOSIS — D649 Anemia, unspecified: Secondary | ICD-10-CM

## 2018-04-11 DIAGNOSIS — I10 Essential (primary) hypertension: Secondary | ICD-10-CM | POA: Diagnosis present

## 2018-04-11 DIAGNOSIS — R0602 Shortness of breath: Secondary | ICD-10-CM

## 2018-04-11 DIAGNOSIS — R079 Chest pain, unspecified: Secondary | ICD-10-CM | POA: Diagnosis present

## 2018-04-11 DIAGNOSIS — F419 Anxiety disorder, unspecified: Secondary | ICD-10-CM | POA: Diagnosis present

## 2018-04-11 DIAGNOSIS — F329 Major depressive disorder, single episode, unspecified: Secondary | ICD-10-CM | POA: Diagnosis present

## 2018-04-11 DIAGNOSIS — E890 Postprocedural hypothyroidism: Secondary | ICD-10-CM | POA: Diagnosis present

## 2018-04-11 DIAGNOSIS — Z79890 Hormone replacement therapy (postmenopausal): Secondary | ICD-10-CM

## 2018-04-11 DIAGNOSIS — J45909 Unspecified asthma, uncomplicated: Secondary | ICD-10-CM | POA: Diagnosis present

## 2018-04-11 DIAGNOSIS — D500 Iron deficiency anemia secondary to blood loss (chronic): Secondary | ICD-10-CM | POA: Diagnosis present

## 2018-04-11 DIAGNOSIS — Z79899 Other long term (current) drug therapy: Secondary | ICD-10-CM

## 2018-04-11 DIAGNOSIS — Z85850 Personal history of malignant neoplasm of thyroid: Secondary | ICD-10-CM

## 2018-04-11 DIAGNOSIS — K921 Melena: Secondary | ICD-10-CM

## 2018-04-11 DIAGNOSIS — Z87891 Personal history of nicotine dependence: Secondary | ICD-10-CM

## 2018-04-11 HISTORY — DX: Other ill-defined heart diseases: I51.89

## 2018-04-11 HISTORY — DX: Chest pain, unspecified: R07.9

## 2018-04-11 LAB — BASIC METABOLIC PANEL
Anion gap: 9 (ref 5–15)
BUN: 17 mg/dL (ref 6–20)
CO2: 26 mmol/L (ref 22–32)
Calcium: 7.9 mg/dL — ABNORMAL LOW (ref 8.9–10.3)
Chloride: 105 mmol/L (ref 98–111)
Creatinine, Ser: 0.81 mg/dL (ref 0.44–1.00)
GFR calc Af Amer: >60 mL/min (ref >60)
GFR calc non Af Amer: >60 mL/min (ref >60)
Glucose, Bld: 96 mg/dL (ref 70–99)
POTASSIUM: 2.9 mmol/L — AB (ref 3.5–5.1)
Sodium: 140 mmol/L (ref 135–145)

## 2018-04-11 LAB — CBC WITH DIFFERENTIAL/PLATELET
Abs Immature Granulocytes: 0.08 10*3/uL — ABNORMAL HIGH (ref 0.00–0.07)
Basophils Absolute: 0 10*3/uL (ref 0.0–0.1)
Basophils Relative: 1 %
Eosinophils Absolute: 0.2 10*3/uL (ref 0.0–0.5)
Eosinophils Relative: 3 %
HCT: 28.1 % — ABNORMAL LOW (ref 36.0–46.0)
Hemoglobin: 9.4 g/dL — ABNORMAL LOW (ref 12.0–15.0)
Immature Granulocytes: 1 %
Lymphocytes Relative: 27 %
Lymphs Abs: 2.1 10*3/uL (ref 0.7–4.0)
MCH: 29 pg (ref 26.0–34.0)
MCHC: 33.5 g/dL (ref 30.0–36.0)
MCV: 86.7 fL (ref 80.0–100.0)
MONO ABS: 0.5 10*3/uL (ref 0.1–1.0)
Monocytes Relative: 6 %
Neutro Abs: 4.8 10*3/uL (ref 1.7–7.7)
Neutrophils Relative %: 62 %
Platelets: 209 10*3/uL (ref 150–400)
RBC: 3.24 MIL/uL — ABNORMAL LOW (ref 3.87–5.11)
RDW: 13.3 % (ref 11.5–15.5)
WBC: 7.6 10*3/uL (ref 4.0–10.5)
nRBC: 0.3 % — ABNORMAL HIGH (ref 0.0–0.2)

## 2018-04-11 LAB — TYPE AND SCREEN
ABO/RH(D): A NEG
ANTIBODY SCREEN: NEGATIVE

## 2018-04-11 LAB — MAGNESIUM: Magnesium: 1.9 mg/dL (ref 1.7–2.4)

## 2018-04-11 LAB — TSH: TSH: 0.416 u[IU]/mL (ref 0.350–4.500)

## 2018-04-11 LAB — TROPONIN I
Troponin I: <0.03 ng/mL (ref <0.03)
Troponin I: <0.03 ng/mL (ref <0.03)
Troponin I: <0.03 ng/mL (ref <0.03)

## 2018-04-11 LAB — FIBRIN DERIVATIVES D-DIMER (ARMC ONLY): Fibrin derivatives D-dimer (ARMC): 361.53 ng/mL (FEU) (ref 0.00–499.00)

## 2018-04-11 MED ORDER — TRIAMTERENE-HCTZ 37.5-25 MG PO TABS
1.0000 | ORAL_TABLET | Freq: Every day | ORAL | Status: DC
  Administered 2018-04-12 – 2018-04-13 (×2): 1 via ORAL
  Filled 2018-04-11 (×2): qty 1

## 2018-04-11 MED ORDER — LEVOTHYROXINE SODIUM 137 MCG PO TABS
137.0000 ug | ORAL_TABLET | Freq: Every day | ORAL | Status: DC
  Administered 2018-04-12 – 2018-04-13 (×2): 137 ug via ORAL
  Filled 2018-04-11 (×2): qty 1

## 2018-04-11 MED ORDER — ONDANSETRON HCL 4 MG PO TABS: 4.0000 mg | ORAL_TABLET | Freq: Four times a day (QID) | ORAL | Status: DC | PRN

## 2018-04-11 MED ORDER — ACETAMINOPHEN 650 MG RE SUPP: 650.0000 mg | Freq: Four times a day (QID) | RECTAL | Status: DC | PRN

## 2018-04-11 MED ORDER — ENOXAPARIN SODIUM 40 MG/0.4ML ~~LOC~~ SOLN: 40.0000 mg | SUBCUTANEOUS | Status: DC

## 2018-04-11 MED ORDER — MONTELUKAST SODIUM 10 MG PO TABS
10.0000 mg | ORAL_TABLET | Freq: Every day | ORAL | Status: DC
  Administered 2018-04-12: 10 mg via ORAL
  Filled 2018-04-11 (×3): qty 1

## 2018-04-11 MED ORDER — IRBESARTAN 150 MG PO TABS
75.0000 mg | ORAL_TABLET | Freq: Every day | ORAL | Status: DC
  Administered 2018-04-12 – 2018-04-13 (×2): 75 mg via ORAL
  Filled 2018-04-11 (×3): qty 1

## 2018-04-11 MED ORDER — CLONAZEPAM 1 MG PO TABS: 1.0000 mg | ORAL_TABLET | Freq: Three times a day (TID) | ORAL | Status: DC | PRN

## 2018-04-11 MED ORDER — PANTOPRAZOLE SODIUM 40 MG IV SOLR: 40.0000 mg | Freq: Two times a day (BID) | INTRAVENOUS | Status: DC

## 2018-04-11 MED ORDER — KETOTIFEN FUMARATE 0.025 % OP SOLN
1.0000 [drp] | Freq: Two times a day (BID) | OPHTHALMIC | Status: DC
  Administered 2018-04-11 – 2018-04-12 (×2): 1 [drp] via OPHTHALMIC
  Filled 2018-04-11: qty 5

## 2018-04-11 MED ORDER — SODIUM CHLORIDE 0.9 % IV SOLN
8.0000 mg/h | INTRAVENOUS | Status: DC
  Administered 2018-04-11: 8 mg/h via INTRAVENOUS
  Filled 2018-04-11 (×2): qty 80

## 2018-04-11 MED ORDER — SODIUM CHLORIDE 0.9 % IV SOLN
80.0000 mg | Freq: Once | INTRAVENOUS | Status: AC
  Administered 2018-04-11: 80 mg via INTRAVENOUS
  Filled 2018-04-11: qty 80

## 2018-04-11 MED ORDER — ACETAMINOPHEN 325 MG PO TABS
650.0000 mg | ORAL_TABLET | Freq: Four times a day (QID) | ORAL | Status: DC | PRN
  Administered 2018-04-12: 650 mg via ORAL
  Filled 2018-04-11 (×2): qty 2

## 2018-04-11 MED ORDER — NITROGLYCERIN 0.4 MG SL SUBL
SUBLINGUAL_TABLET | SUBLINGUAL | Status: AC
  Filled 2018-04-11: qty 1

## 2018-04-11 MED ORDER — POTASSIUM CHLORIDE CRYS ER 20 MEQ PO TBCR
40.0000 meq | EXTENDED_RELEASE_TABLET | ORAL | Status: AC
  Administered 2018-04-11 – 2018-04-12 (×4): 40 meq via ORAL
  Filled 2018-04-11 (×4): qty 2

## 2018-04-11 MED ORDER — SERTRALINE HCL 50 MG PO TABS
150.0000 mg | ORAL_TABLET | Freq: Every day | ORAL | Status: DC
  Administered 2018-04-12 – 2018-04-13 (×2): 150 mg via ORAL
  Filled 2018-04-11 (×3): qty 3

## 2018-04-11 MED ORDER — ONDANSETRON HCL 4 MG/2ML IJ SOLN: 4.0000 mg | Freq: Four times a day (QID) | INTRAMUSCULAR | Status: DC | PRN

## 2018-04-11 MED ORDER — VERAPAMIL HCL ER 180 MG PO TBCR
360.0000 mg | EXTENDED_RELEASE_TABLET | Freq: Every day | ORAL | Status: DC
  Administered 2018-04-12 – 2018-04-13 (×2): 360 mg via ORAL
  Filled 2018-04-11 (×3): qty 2

## 2018-04-11 MED ORDER — SODIUM CHLORIDE 0.9 % IV SOLN
INTRAVENOUS | Status: DC
  Administered 2018-04-11 – 2018-04-12 (×3): via INTRAVENOUS

## 2018-04-11 MED ORDER — AMPHETAMINE-DEXTROAMPHETAMINE 5 MG PO TABS: 20.0000 mg | ORAL_TABLET | Freq: Two times a day (BID) | ORAL | Status: DC

## 2018-04-11 MED ORDER — ALBUTEROL SULFATE (2.5 MG/3ML) 0.083% IN NEBU: 2.5000 mg | INHALATION_SOLUTION | Freq: Four times a day (QID) | RESPIRATORY_TRACT | Status: DC | PRN

## 2018-04-11 MED ORDER — CALCITRIOL 0.25 MCG PO CAPS
0.2500 ug | ORAL_CAPSULE | Freq: Every day | ORAL | Status: DC
  Administered 2018-04-12 – 2018-04-13 (×2): 0.25 ug via ORAL
  Filled 2018-04-11 (×2): qty 1

## 2018-04-11 MED ORDER — NITROGLYCERIN 0.4 MG SL SUBL
0.4000 mg | SUBLINGUAL_TABLET | SUBLINGUAL | Status: DC | PRN
  Administered 2018-04-11: 0.4 mg via SUBLINGUAL

## 2018-04-11 NOTE — ED Notes (Signed)
admitting Provider at bedside. 

## 2018-04-11 NOTE — H&P (Signed)
[facility] [place] [place] NAME: [name]    MR#:  [id number]  DATE OF BIRTH:  08/15/1960  DATE OF ADMISSION:  04/11/2018  PRIMARY CARE PHYSICIAN: [name], MD   REQUESTING/REFERRING PHYSICIAN:  Nance Pear, MD     CHIEF COMPLAINT:   Chief Complaint  Patient presents with  . Chest Pain    HISTORY OF PRESENT ILLNESS: [name]  is a 58 y.o. female with a known history of asthma, depression, hypertension, hypothyroidism, history of thyroid cancer who is presenting to the emergency room with complaint of chest pain.  Patient states that her symptoms started yesterday.  She states that these started all of a sudden when she tried to walk she got very short of breath.  She also started having chest pain on the left side of her chest.  She described it as a pressure type of pain.  She also states that today she had radiation of the pain in her arm.  Son noticed that she has been having tarry stools ongoing for the past few days.  In the ER patient now is noted to be newly anemic compared to hemoglobin past year.  GI physician has started her on a Protonix drip.   PAST MEDICAL HISTORY:   Past Medical History:  Diagnosis Date  . Allergy   . Asthma   . Depression   . Deviated septum   . Herpes   . HTN (hypertension)   . Hypothyroid   . Insomnia   . Neuromuscular disorder ([place])   . Thyroid cancer ([facility]) 2009    PAST SURGICAL HISTORY:  Past Surgical History:  Procedure Laterality Date  . CESAREAN SECTION    . CHOLECYSTECTOMY  03/26/2006  . DILATION AND CURETTAGE OF UTERUS  06/30/2000  . THYROIDECTOMY     PARATHYROIDECTOMY    SOCIAL HISTORY:  Social History   Tobacco Use  . Smoking status: Former Smoker    Packs/day: 2.00    Years: 20.00    Pack years: 40.00    Last attempt to quit: 05/13/1990    Years since quitting: 27.9  . Smokeless tobacco: Never Used  Substance Use Topics  . Alcohol use: No    Comment: Rarely     FAMILY HISTORY:  Family History  Problem Relation Age of Onset  . Heart disease Father        CABG in his 3s  . Hyperlipidemia Father   . Heart disease Brother        CABG  . Heart disease Sister        Stents in 55s  . Hyperlipidemia Sister   . Heart disease Sister        CABG in 27s  . Hyperlipidemia Sister   . Hyperlipidemia Mother   . Breast cancer Maternal Aunt   . Hyperlipidemia Maternal Uncle   . Hyperlipidemia Maternal Grandmother   . Breast cancer Maternal Grandmother   . Hyperlipidemia Paternal Grandmother   . Breast cancer Paternal Grandmother     DRUG ALLERGIES:  Allergies  Allergen Reactions  . Lisinopril     REVIEW OF SYSTEMS:   CONSTITUTIONAL: No fever, fatigue or weakness.  EYES: No blurred or double vision.  EARS, NOSE, AND THROAT: No tinnitus or ear pain.  RESPIRATORY: No cough, shortness of breath, wheezing or hemoptysis.  CARDIOVASCULAR: Positive chest pain, orthopnea, edema.  GASTROINTESTINAL: No nausea, vomiting, diarrhea or abdominal pain.  GENITOURINARY: No dysuria, hematuria.  ENDOCRINE: No polyuria,  nocturia,  HEMATOLOGY: No anemia, easy bruising or bleeding SKIN: No rash or lesion. MUSCULOSKELETAL: No joint pain or arthritis.   NEUROLOGIC: No tingling, numbness, weakness.  PSYCHIATRY: No anxiety or depression.   MEDICATIONS AT HOME:  Prior to Admission medications   Medication Sig Start Date End Date Taking? Authorizing Provider  acyclovir (ZOVIRAX) 200 MG capsule TAKE 2 CAPSULES BY MOUTH 2 TIMES DAILY. 01/12/18  Yes [name], MD  amphetamine-dextroamphetamine (ADDERALL) 20 MG tablet Take 20 mg by mouth 2 (two) times daily. 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 07/11/17  Yes [name], MD   calcitRIOL (ROCALTROL) 0.25 MCG capsule TAKE 2 CAPSULES (0.5 MCG TOTAL) BY MOUTH DAILY. 01/12/18  Yes [name], MD  clonazePAM (KLONOPIN) 1 MG tablet Take 1 tablet (1 mg total) by mouth 3 (three) times daily as needed. 01/12/18  Yes [name], MD  Fluocinolone Acetonide 0.01 % OIL Place 5 drops in ear(s) 2 (two) times daily. 01/12/18  Yes [name], MD  levothyroxine (SYNTHROID, LEVOTHROID) 137 MCG tablet Take 1 tablet (137 mcg total) by mouth daily before breakfast. 01/12/18  Yes [name], MD  montelukast (SINGULAIR) 10 MG tablet TAKE 1 TABLET (10 MG TOTAL) BY MOUTH AT BEDTIME. Patient taking differently: Take 10 mg by mouth daily.  12/14/17  Yes [name], MD  olmesartan (BENICAR) 20 MG tablet Take 1 tablet (20 mg total) by mouth daily. Patient taking differently: Take 40 mg by mouth daily.  01/12/18  Yes [name], MD  oxyCODONE-acetaminophen (PERCOCET) 10-325 MG tablet Take 1 tablet by mouth every 6 (six) hours as needed for pain. 01/12/18  Yes [name], MD  rizatriptan (MAXALT) 10 MG tablet Take 1 tablet (10 mg total) by mouth as needed for migraine. May repeat in 2 hours if needed 12/21/17  Yes [name], MD  sertraline (ZOLOFT) 50 MG tablet Take 150 mg by mouth daily.    Yes [provider]  triamterene-hydrochlorothiazide (DYAZIDE) 37.5-25 MG capsule Take 1 each (1 capsule total) by mouth daily. 01/12/18  Yes [name], MD  verapamil (CALAN-SR) 180 MG CR tablet Take 2 tablets (360 mg total) by mouth at bedtime. Patient taking differently: Take 360 mg by mouth daily.  07/11/17  Yes [name], MD  albuterol (PROVENTIL HFA;VENTOLIN HFA) 108 (90 Base) MCG/ACT inhaler TAKE 2 PUFFS BY MOUTH EVERY 4 HOURS AS NEEDED FOR WHEEZE 12/14/17   [name], MD      PHYSICAL EXAMINATION:   VITAL SIGNS: Blood pressure (!) 147/78, pulse 82, temperature 98.1 F (36.7 C), temperature source Oral, resp. rate 17, height 5\' 5"  (1.651 m), weight 88.5 kg, last menstrual period  06/17/2002, SpO2 99 %.  GENERAL:  58 y.o.-year-old patient lying in the bed with no acute distress.  EYES: Pupils equal, round, reactive to light and accommodation. No scleral icterus. Extraocular muscles intact.  HEENT: Head atraumatic, normocephalic. Oropharynx and nasopharynx clear.  NECK:  Supple, no jugular venous distention. No thyroid enlargement, no tenderness.  LUNGS: Normal breath sounds bilaterally, no wheezing, rales,rhonchi or crepitation. No use of accessory muscles of respiration.  CARDIOVASCULAR: S1, S2 normal. No murmurs, rubs, or gallops.  ABDOMEN: Soft, nontender, nondistended. Bowel  sounds present. No organomegaly or mass.  EXTREMITIES: No pedal edema, cyanosis, or clubbing.  NEUROLOGIC: Cranial nerves II through XII are intact. Muscle strength 5/5 in all extremities. Sensation intact. Gait not checked.  PSYCHIATRIC: The patient is alert and oriented x 3.  SKIN: No obvious rash, lesion, or ulcer.   LABORATORY PANEL:   CBC Recent Labs  Lab 04/11/18 1518  WBC 7.6  HGB 9.4*  HCT 28.1*  PLT 209  MCV 86.7  MCH 29.0  MCHC 33.5  RDW 13.3  LYMPHSABS 2.1  MONOABS 0.5  EOSABS 0.2  BASOSABS 0.0   ------------------------------------------------------------------------------------------------------------------  Chemistries  Recent Labs  Lab 04/11/18 1518  NA 140  K 2.9*  CL 105  CO2 26  GLUCOSE 96  BUN 17  CREATININE 0.81  CALCIUM 7.9*   ------------------------------------------------------------------------------------------------------------------ estimated creatinine clearance is 85.2 mL/min (by C-G formula based on SCr of 0.81 mg/dL). ------------------------------------------------------------------------------------------------------------------ No results for input(s): TSH, T4TOTAL, T3FREE, THYROIDAB in the last 72 hours.  Invalid input(s): FREET3   Coagulation profile No results for input(s): INR, PROTIME in the last 168  hours. ------------------------------------------------------------------------------------------------------------------- No results for input(s): DDIMER in the last 72 hours. -------------------------------------------------------------------------------------------------------------------  Cardiac Enzymes Recent Labs  Lab 04/11/18 1518  TROPONINI <0.03   ------------------------------------------------------------------------------------------------------------------ Invalid input(s): POCBNP  ---------------------------------------------------------------------------------------------------------------  Urinalysis    Component Value Date/Time   COLORURINE YELLOW 06/17/2011 [place] 06/17/2011 1613   LABSPEC 1.023 06/17/2011 1613   PHURINE 6.0 06/17/2011 1613   GLUCOSEU NEG 06/17/2011 1613   HGBUR NEG 06/17/2011 1613   BILIRUBINUR small (A) 11/19/2016 0834   BILIRUBINUR neg 08/16/2012 1804   KETONESUR trace (5) (A) 11/19/2016 0834   KETONESUR NEG 06/17/2011 1613   PROTEINUR negative 11/19/2016 0834   PROTEINUR neg 08/16/2012 1804   PROTEINUR NEG 06/17/2011 1613   UROBILINOGEN 0.2 11/19/2016 0834   UROBILINOGEN 0.2 06/17/2011 1613   NITRITE Negative 11/19/2016 0834   NITRITE neg 08/16/2012 1804   NITRITE NEG 06/17/2011 1613   LEUKOCYTESUR Negative 11/19/2016 0834     RADIOLOGY: Dg Chest Portable 1 View  Result Date: 04/11/2018 CLINICAL DATA:  Intermittent chest pain beginning yesterday. EXAM: PORTABLE CHEST 1 VIEW COMPARISON:  None. FINDINGS: Lungs clear. Heart size normal. No pneumothorax or pleural fluid. No bony abnormality. IMPRESSION: Negative chest. Electronically Signed   By: Inge Rise M.D.   On: 04/11/2018 15:47    EKG: Orders placed or performed during the hospital encounter of 04/11/18  . EKG 12-Lead  . EKG 12-Lead    IMPRESSION AND PLAN: Patient 58 year old presenting with chest pain and tarry stools  1.  Chest pain appears  to have atypical cardiac chest pain however there is a strong family history I will check a d-dimer Cycle cardiac enzymes Cardiology has been notified-message sent to Dr. Rockey Situ Hold aspirin currently due to concern for GI bleed  2.  GI bleed likely upper follow hemoglobin continue Protonix drip Gastroenterology also has been consulted message sent to Dr. Alice Reichert  3.  Hypothyroidism continue Synthroid  4.  Severe hypokalemia potassium will be replaced orally check magnesium repeat BMP in the morning  5.  Essential hypertension continue Dyazide and Calan  6.  Anxiety continue Klonopin  7.  Miscellaneous SCDs for DVT prophylaxis  All the records are reviewed and case discussed with ED provider. Management plans discussed with the patient, family and they are in agreement.  CODE STATUS: Full code    TOTAL TIME TAKING CARE OF THIS PATIENT:55 minutes.  Dustin Flock M.D on 04/11/2018 at 4:46 PM  Between 7am to 6pm - Pager - [phone]  After 6pm go to [email or web address] - password [email or web address]  [employer] Office  [phone]  CC: Primary care physician; [name], MD

## 2018-04-11 NOTE — ED Triage Notes (Signed)
PT c/o CP , states started with SOB yesterday and states chest pressure with tingling to LFT arm today. Denies any fever or cough. Speech clear, NAD noted.

## 2018-04-11 NOTE — ED Notes (Signed)
Report given to Kelly RN 

## 2018-04-11 NOTE — ED Notes (Signed)
ED TO INPATIENT HANDOFF REPORT  ED Nurse Name and Phone #: Claiborne Billings [phone]  S Name/Age/Gender [name] 58 y.o. female Room/Bed: ED08A/ED08A  Code Status   Code Status: Not on file  Home/SNF/Other Home Patient oriented to: self, place, time and situation Is this baseline? Yes   Triage Complete: Triage complete  Chief Complaint chest pain  Triage Note FIRST NURSE NOTE-feels like having MI. Pulled immediately for EKG  PT c/o CP , states started with SOB yesterday and states chest pressure with tingling to LFT arm today. Denies any fever or cough. Speech clear, NAD noted.    Allergies Allergies  Allergen Reactions  . Lisinopril     Level of Care/Admitting Diagnosis ED Disposition    ED Disposition Condition [facility]: [place] [[id number]]  Level of Care: Telemetry [5]  Diagnosis: Chest pain [361443]  Admitting Physician: Dustin Flock [[id number]]  Attending Physician: Dustin Flock [[id number]]  Estimated length of stay: past midnight tomorrow  Certification:: I certify this patient will need inpatient services for at least 2 midnights  PT Class (Do Not Modify): Inpatient [101]  PT Acc Code (Do Not Modify): Private [1]       B Medical/Surgery History Past Medical History:  Diagnosis Date  . Allergy   . Asthma   . Depression   . Deviated septum   . Herpes   . HTN (hypertension)   . Hypothyroid   . Insomnia   . Neuromuscular disorder ([place])   . Thyroid cancer ([place]) 2009   Past Surgical History:  Procedure Laterality Date  . CESAREAN SECTION    . CHOLECYSTECTOMY  03/26/2006  . DILATION AND CURETTAGE OF UTERUS  06/30/2000  . THYROIDECTOMY     PARATHYROIDECTOMY     A IV Location/Drains/Wounds Patient Lines/Drains/Airways Status   Active Line/Drains/Airways    Name:   Placement date:   Placement time:   Site:   Days:   Peripheral IV 04/11/18 Right Antecubital   04/11/18    1527    Antecubital   less than 1    Peripheral IV 04/11/18 Right Forearm   04/11/18    1636    Forearm   less than 1          Intake/Output Last 24 hours No intake or output data in the 24 hours ending 04/11/18 1729  Labs/Imaging Results for orders placed or performed during the hospital encounter of 04/11/18 (from the past 48 hour(s))  CBC with Differential     Status: Abnormal   Collection Time: 04/11/18  3:18 PM  Result Value Ref Range   WBC 7.6 4.0 - 10.5 K/uL   RBC 3.24 (L) 3.87 - 5.11 MIL/uL   Hemoglobin 9.4 (L) 12.0 - 15.0 g/dL   HCT 28.1 (L) 36.0 - 46.0 %   MCV 86.7 80.0 - 100.0 fL   MCH 29.0 26.0 - 34.0 pg   MCHC 33.5 30.0 - 36.0 g/dL   RDW 13.3 11.5 - 15.5 %   Platelets 209 150 - 400 K/uL   nRBC 0.3 (H) 0.0 - 0.2 %   Neutrophils Relative % 62 %   Neutro Abs 4.8 1.7 - 7.7 K/uL   Lymphocytes Relative 27 %   Lymphs Abs 2.1 0.7 - 4.0 K/uL   Monocytes Relative 6 %   Monocytes Absolute 0.5 0.1 - 1.0 K/uL   Eosinophils Relative 3 %   Eosinophils Absolute 0.2 0.0 - 0.5 K/uL   Basophils Relative 1 %  Basophils Absolute 0.0 0.0 - 0.1 K/uL   Immature Granulocytes 1 %   Abs Immature Granulocytes 0.08 (H) 0.00 - 0.07 K/uL    Comment: Performed at [facility], [address] [place], [place] [address]  Basic metabolic panel     Status: Abnormal   Collection Time: 04/11/18  3:18 PM  Result Value Ref Range   Sodium 140 135 - 145 mmol/L   Potassium 2.9 (L) 3.5 - 5.1 mmol/L   Chloride 105 98 - 111 mmol/L   CO2 26 22 - 32 mmol/L   Glucose, Bld 96 70 - 99 mg/dL   BUN 17 6 - 20 mg/dL   Creatinine, Ser 0.81 0.44 - 1.00 mg/dL   Calcium 7.9 (L) 8.9 - 10.3 mg/dL   GFR calc non Af Amer >60 >60 mL/min   GFR calc Af Amer >60 >60 mL/min   Anion gap 9 5 - 15    Comment: Performed at [facility], [address] [place], [place] [address]  Troponin I - ONCE - STAT     Status: None   Collection Time: 04/11/18  3:18 PM  Result Value Ref Range   Troponin I <0.03 <0.03 ng/mL    Comment:  Performed at [facility], [address] [place], [place] [address]  Type and screen [place]     Status: None (Preliminary result)   Collection Time: 04/11/18  4:33 PM  Result Value Ref Range   ABO/RH(D) PENDING    Antibody Screen PENDING    Sample Expiration      04/14/2018 Performed at [facility], [address], [place], [place] [address]    Dg Chest Portable 1 View  Result Date: 04/11/2018 CLINICAL DATA:  Intermittent chest pain beginning yesterday. EXAM: PORTABLE CHEST 1 VIEW COMPARISON:  None. FINDINGS: Lungs clear. Heart size normal. No pneumothorax or pleural fluid. No bony abnormality. IMPRESSION: Negative chest. Electronically Signed   By: Inge Rise M.D.   On: 04/11/2018 15:47    Pending Labs Unresulted Labs (From admission, onward)    Start     Ordered   04/11/18 1717  Magnesium  Add-on,   AD     04/11/18 1716   04/11/18 1716  Fibrin derivatives D-Dimer ([facility] only)  ONCE - STAT,   STAT     04/11/18 1715   04/11/18 1713  Type and screen  Once,   STAT     04/11/18 1713   04/11/18 1709  Troponin I - Now Then Q6H  Now then every 6 hours,   STAT     04/11/18 1708   Signed and Held  HIV antibody (Routine Testing)  Once,   R     Signed and Held   Signed and Held  CBC  (enoxaparin (LOVENOX)    CrCl >/= 30 ml/min)  Once,   R    Comments:  Baseline for enoxaparin therapy IF NOT ALREADY DRAWN.  Notify MD if PLT < 100 K.    Signed and Held   Signed and Held  Creatinine, serum  (enoxaparin (LOVENOX)    CrCl >/= 30 ml/min)  Once,   R    Comments:  Baseline for enoxaparin therapy IF NOT ALREADY DRAWN.    Signed and Held   Signed and Held  Creatinine, serum  (enoxaparin (LOVENOX)    CrCl >/= 30 ml/min)  Weekly,   R    Comments:  while on enoxaparin therapy    Signed and Held  Signed and Held  CBC  Tomorrow morning,   R     Signed and Held   Signed and Held  Basic metabolic panel  Tomorrow morning,   R     Signed and Held    Signed and Held  TSH  Once,   R     Signed and Held          Vitals/Pain Today's Vitals   04/11/18 1500 04/11/18 1530 04/11/18 1600 04/11/18 1700  BP: (!) 150/86 138/66 (!) 147/78 (!) 154/79  Pulse: 90 80 82 79  Resp: 16 17 17 13   Temp: 98.1 F (36.7 C)     TempSrc: Oral     SpO2: 100% 97% 99% 99%  Weight:      Height:      PainSc:        Isolation Precautions No active isolations  Medications Medications  pantoprazole (PROTONIX) 80 mg in sodium chloride 0.9 % 100 mL IVPB (has no administration in time range)  pantoprazole (PROTONIX) 80 mg in sodium chloride 0.9 % 250 mL (0.32 mg/mL) infusion (has no administration in time range)  pantoprazole (PROTONIX) injection 40 mg (has no administration in time range)  nitroGLYCERIN (NITROSTAT) SL tablet 0.4 mg (0.4 mg Sublingual Given 04/11/18 1727)  potassium chloride SA (K-DUR,KLOR-CON) CR tablet 40 mEq (has no administration in time range)    Mobility walks Low fall risk   Focused Assessments Cardiac Assessment Handoff:  Cardiac Rhythm: Normal sinus rhythm Lab Results  Component Value Date   TROPONINI <0.03 04/11/2018   No results found for: DDIMER Does the Patient currently have chest pain? Yes  Nitro SL given   R Recommendations: See Admitting Provider Note  Report given to:   Additional Notes:  +heme stool occult. Waiting on Protonix infusion from pharmacy.

## 2018-04-11 NOTE — ED Triage Notes (Signed)
FIRST NURSE NOTE-feels like having MI. Pulled immediately for EKG

## 2018-04-11 NOTE — ED Provider Notes (Signed)
[facility] Emergency Department Provider Note  ____________________________________________   I have reviewed the triage vital signs and the nursing notes.   HISTORY  Chief Complaint Shortness of breath  History limited by: Not Limited   HPI [name] is a 58 y.o. female who presents to the emergency department today with complaints of shortness of breath and chest pain.  Patient states that the shortness of breath started yesterday.  It did start somewhat suddenly.  She noticed it worse when she was trying to exert herself.  She then started developing some chest pressure.  She describes as being located in the center of her chest.  She personally does not have any history of heart disease although states there is a long history of heart disease in her family.  She denies any fevers.  Denies any nausea or vomiting.  She does however state that for the past couple of days she noticed that her stool has been darker and almost black in color.   Records reviewed. Per medical record review patient has a history of HTN  Past Medical History:  Diagnosis Date  . Allergy   . Asthma   . Depression   . Deviated septum   . Herpes   . HTN (hypertension)   . Hypothyroid   . Insomnia   . Neuromuscular disorder ([place])   . Thyroid cancer [facility]) 2009    Patient Active Problem List   Diagnosis Date Noted  . Sprain, ankle joint, medial, left, initial encounter 03/12/2016  . Cervical disc disorder with radiculopathy of cervical region 10/08/2013  . 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 Menopause 06/17/2011  . Allergic rhinitis 05/13/2011  . Reactive airway disease 05/13/2011  . Depressed 05/13/2011  . Migraine 05/13/2011  . Thyroid cancer ([place]) 05/13/2011  . Hypothyroidism, postsurgical 05/13/2011  . Hypertension 12/25/2010    Past Surgical History:  Procedure Laterality Date  . CESAREAN SECTION     . CHOLECYSTECTOMY  03/26/2006  . DILATION AND CURETTAGE OF UTERUS  06/30/2000  . THYROIDECTOMY     PARATHYROIDECTOMY    Prior to Admission medications   Medication Sig Start Date End Date Taking? Authorizing Provider  acyclovir (ZOVIRAX) 200 MG capsule TAKE 2 CAPSULES BY MOUTH 2 TIMES DAILY. 01/12/18   [name], MD  albuterol (PROVENTIL HFA;VENTOLIN HFA) 108 (90 Base) MCG/ACT inhaler TAKE 2 PUFFS BY MOUTH EVERY 4 HOURS AS NEEDED FOR WHEEZE 12/14/17   [name], MD  amoxicillin (AMOXIL) 500 MG capsule Take 2 capsules (1,000 mg total) by mouth 3 (three) times daily. 01/12/18   [name], MD  amphetamine-dextroamphetamine (ADDERALL) 20 MG tablet dextroamphetamine-amphetamine 20 mg tablet  TAKE 1 TABLET TWICE A DAY    [provider]  azelastine (OPTIVAR) 0.05 % ophthalmic solution PLACE ONE DROP INTO EACH EYE TWICE A DAY 01/12/18   [name], MD  betamethasone dipropionate (DIPROLENE) 0.05 % cream Apply topically 2 (two) times daily. 07/11/17   [name], MD  calcitRIOL (ROCALTROL) 0.25 MCG capsule TAKE 2 CAPSULES (0.5 MCG TOTAL) BY MOUTH DAILY. 01/12/18   [name], MD  clonazePAM (KLONOPIN) 1 MG tablet Take 1 tablet (1 mg total) by mouth 3 (three) times daily as needed. 01/12/18   [name], MD  Fluocinolone Acetonide 0.01 % OIL Place 5 drops in ear(s) 2 (two) times daily. 01/12/18   [name], MD  levothyroxine (SYNTHROID, LEVOTHROID) 137 MCG tablet Take 1  tablet (137 mcg total) by mouth daily before breakfast. 01/12/18   [name], MD  montelukast (SINGULAIR) 10 MG tablet TAKE 1 TABLET (10 MG TOTAL) BY MOUTH AT BEDTIME. 12/14/17   [name], MD  olmesartan (BENICAR) 20 MG tablet Take 1 tablet (20 mg total) by mouth daily. 01/12/18   [name], MD  oseltamivir (TAMIFLU) 75 MG capsule Take 1 capsule (75 mg total) by mouth 2 (two) times daily. 01/12/18   [name], MD  oxyCODONE-acetaminophen (PERCOCET) 10-325 MG tablet Take 1 tablet by mouth every 6 (six) hours as  needed for pain. 01/12/18   [name], MD  rizatriptan (MAXALT) 10 MG tablet Take 1 tablet (10 mg total) by mouth as needed for migraine. May repeat in 2 hours if needed 12/21/17   [name], MD  sertraline (ZOLOFT) 50 MG tablet Take 50 mg by mouth daily.    [provider]  triamterene-hydrochlorothiazide (DYAZIDE) 37.5-25 MG capsule Take 1 each (1 capsule total) by mouth daily. 01/12/18   [name], MD  verapamil (CALAN-SR) 180 MG CR tablet Take 2 tablets (360 mg total) by mouth at bedtime. 07/11/17   [name], MD    Allergies Lisinopril  Family History  Problem Relation Age of Onset  . Heart disease Father        CABG in his 74s  . Hyperlipidemia Father   . Heart disease Brother        CABG  . Heart disease Sister        Stents in 80s  . Hyperlipidemia Sister   . Heart disease Sister        CABG in 48s  . Hyperlipidemia Sister   . Hyperlipidemia Mother   . Breast cancer Maternal Aunt   . Hyperlipidemia Maternal Uncle   . Hyperlipidemia Maternal Grandmother   . Breast cancer Maternal Grandmother   . Hyperlipidemia Paternal Grandmother   . Breast cancer Paternal Grandmother     Social History Social History   Tobacco Use  . Smoking status: Former Smoker    Packs/day: 2.00    Years: 20.00    Pack years: 40.00    Last attempt to quit: 05/13/1990    Years since quitting: 27.9  . Smokeless tobacco: Never Used  Substance Use Topics  . Alcohol use: No    Comment: Rarely  . Drug use: No    Review of Systems Constitutional: No fever/chills Eyes: No visual changes. ENT: No sore throat. Cardiovascular: Positive for chest pressure Respiratory: Positive for shortness of breath. Gastrointestinal: No abdominal pain.  No nausea, no vomiting.  No diarrhea.   Genitourinary: Negative for dysuria. Musculoskeletal: Negative for back pain. Skin: Negative for rash. Neurological: Negative for headaches, focal weakness or  numbness.  ____________________________________________   PHYSICAL EXAM:  VITAL SIGNS: ED Triage Vitals  Enc Vitals Group     BP 04/11/18 1500 (!) 150/86     Pulse Rate 04/11/18 1500 90     Resp 04/11/18 1500 16     Temp 04/11/18 1500 98.1 F (36.7 C)     Temp Source 04/11/18 1500 Oral     SpO2 04/11/18 1500 100 %     Weight 04/11/18 1459 195 lb (88.5 kg)     Height 04/11/18 1459 5\' 5"  (1.651 m)     Head Circumference --      Peak Flow --      Pain Score 04/11/18 1459 6   Constitutional: Alert  and oriented.  Eyes: Conjunctivae are normal.  ENT      Head: Normocephalic and atraumatic.      Nose: No congestion/rhinnorhea.      Mouth/Throat: Mucous membranes are moist.      Neck: No stridor. Hematological/Lymphatic/Immunilogical: No cervical lymphadenopathy. Cardiovascular: Normal rate, regular rhythm.  No murmurs, rubs, or gallops.  Respiratory: Normal respiratory effort without tachypnea nor retractions. Breath sounds are clear and equal bilaterally. No wheezes/rales/rhonchi. Gastrointestinal: Soft and non tender. No rebound. No guarding.  Rectal: Melenotic stool on glove. GUIAC positive  Musculoskeletal: Normal range of motion in all extremities. No lower extremity edema. Neurologic:  Normal speech and language. No gross focal neurologic deficits are appreciated.  Skin:  Skin is warm, dry and intact. No rash noted. Psychiatric: Mood and affect are normal. Speech and behavior are normal. Patient exhibits appropriate insight and judgment.  ____________________________________________    LABS (pertinent positives/negatives)  Trop <0.03 BMP wnl except k 2.9, ca 7.9 CBC wbc 7.6, hgb 9.4, plt 209  ____________________________________________   EKG  I, Nance Pear, attending physician, personally viewed and interpreted this EKG  EKG Time: 1501 Rate: 94 Rhythm: normal sinus rhythm Axis: normal Intervals: qtc 470 QRS: narrow ST changes: no st  elevation Impression: possible left atrial enlargement, otherwise normal   ____________________________________________    RADIOLOGY  CXR No acute cardiopulmonary findings.   ____________________________________________   PROCEDURES  Procedures  ____________________________________________   INITIAL IMPRESSION / ASSESSMENT AND PLAN / ED COURSE  Pertinent labs & imaging results that were available during my care of the patient were reviewed by me and considered in my medical decision making (see chart for details).   Patient presented to the emergency department today because of concerns for shortness of breath and some chest pressure.  Differential would be broad including ACS, pneumonia, PE, or aortic dissection, anemia, electrolyte abnormality amongst other etiologies.  Discussion with the patient she did describe some darker stools.  Blood work is concerning for anemia.  Most recent hemoglobin was normal 2 months ago.  Rectal exam does show melanotic stool on the glove.  At this point I think shortness of breath likely secondary to blood loss anemia.  Will start Protonix will plan on admission.  Discussed findings and plan with patient.  ____________________________________________   FINAL CLINICAL IMPRESSION(S) / ED DIAGNOSES  Final diagnoses:  Shortness of breath  Anemia, unspecified type  Melena     Note: This dictation was prepared with Dragon dictation. Any transcriptional errors that result from this process are unintentional     Nance Pear, MD 04/11/18 1635

## 2018-04-12 ENCOUNTER — Encounter: Payer: Self-pay | Admitting: Nurse Practitioner

## 2018-04-12 ENCOUNTER — Inpatient Hospital Stay (HOSPITAL_COMMUNITY)
Admit: 2018-04-12 | Discharge: 2018-04-12 | Disposition: A | Discharge status: Home / Self Care | Payer: Self-pay | Attending: Nurse Practitioner | Admitting: Nurse Practitioner

## 2018-04-12 ENCOUNTER — Inpatient Hospital Stay: Payer: Self-pay | Admitting: Anesthesiology

## 2018-04-12 ENCOUNTER — Other Ambulatory Visit: Payer: Self-pay

## 2018-04-12 ENCOUNTER — Encounter
Admission: EM | Disposition: A | Discharge status: Home / Self Care | Payer: Self-pay | Source: Home / Self Care | Attending: Internal Medicine

## 2018-04-12 DIAGNOSIS — I340 Nonrheumatic mitral (valve) insufficiency: Secondary | ICD-10-CM

## 2018-04-12 DIAGNOSIS — R0789 Other chest pain: Secondary | ICD-10-CM

## 2018-04-12 DIAGNOSIS — K921 Melena: Secondary | ICD-10-CM

## 2018-04-12 HISTORY — PX: ESOPHAGOGASTRODUODENOSCOPY (EGD) WITH PROPOFOL: SHX5813

## 2018-04-12 LAB — ECHOCARDIOGRAM COMPLETE
Height: 65 in
Weight: 3088 oz

## 2018-04-12 LAB — BASIC METABOLIC PANEL
Anion gap: 5 (ref 5–15)
BUN: 16 mg/dL (ref 6–20)
CO2: 27 mmol/L (ref 22–32)
Calcium: 7.8 mg/dL — ABNORMAL LOW (ref 8.9–10.3)
Chloride: 112 mmol/L — ABNORMAL HIGH (ref 98–111)
Creatinine, Ser: 0.55 mg/dL (ref 0.44–1.00)
GFR calc Af Amer: >60 mL/min (ref >60)
GFR calc non Af Amer: >60 mL/min (ref >60)
Glucose, Bld: 100 mg/dL — ABNORMAL HIGH (ref 70–99)
Potassium: 3.8 mmol/L (ref 3.5–5.1)
Sodium: 144 mmol/L (ref 135–145)

## 2018-04-12 LAB — CBC
HCT: 26.1 % — ABNORMAL LOW (ref 36.0–46.0)
Hemoglobin: 8.5 g/dL — ABNORMAL LOW (ref 12.0–15.0)
MCH: 28.2 pg (ref 26.0–34.0)
MCHC: 32.6 g/dL (ref 30.0–36.0)
MCV: 86.7 fL (ref 80.0–100.0)
PLATELETS: 165 10*3/uL (ref 150–400)
RBC: 3.01 MIL/uL — ABNORMAL LOW (ref 3.87–5.11)
RDW: 13.6 % (ref 11.5–15.5)
WBC: 5.7 10*3/uL (ref 4.0–10.5)
nRBC: 0 % (ref 0.0–0.2)

## 2018-04-12 LAB — TROPONIN I: Troponin I: <0.03 ng/mL (ref <0.03)

## 2018-04-12 SURGERY — ESOPHAGOGASTRODUODENOSCOPY (EGD) WITH PROPOFOL
Anesthesia: General

## 2018-04-12 MED ORDER — LIDOCAINE HCL (CARDIAC) PF 100 MG/5ML IV SOSY
PREFILLED_SYRINGE | INTRAVENOUS | Status: DC | PRN
  Administered 2018-04-12: 60 mg via INTRAVENOUS

## 2018-04-12 MED ORDER — SUCCINYLCHOLINE CHLORIDE 20 MG/ML IJ SOLN
INTRAMUSCULAR | Status: AC
  Filled 2018-04-12: qty 1

## 2018-04-12 MED ORDER — PROPOFOL 10 MG/ML IV BOLUS
INTRAVENOUS | Status: AC
  Filled 2018-04-12: qty 20

## 2018-04-12 MED ORDER — PROPOFOL 500 MG/50ML IV EMUL
INTRAVENOUS | Status: DC | PRN
  Administered 2018-04-12: 120 ug/kg/min via INTRAVENOUS

## 2018-04-12 MED ORDER — ONDANSETRON HCL 4 MG/2ML IJ SOLN
INTRAMUSCULAR | Status: AC
  Filled 2018-04-12: qty 2

## 2018-04-12 MED ORDER — MIDAZOLAM HCL 2 MG/2ML IJ SOLN
INTRAMUSCULAR | Status: AC
  Filled 2018-04-12: qty 2

## 2018-04-12 MED ORDER — ROCURONIUM BROMIDE 50 MG/5ML IV SOLN
INTRAVENOUS | Status: AC
  Filled 2018-04-12: qty 1

## 2018-04-12 MED ORDER — PROPOFOL 10 MG/ML IV BOLUS
INTRAVENOUS | Status: DC | PRN
  Administered 2018-04-12: 70 mg via INTRAVENOUS

## 2018-04-12 MED ORDER — PANTOPRAZOLE SODIUM 40 MG PO TBEC
40.0000 mg | DELAYED_RELEASE_TABLET | Freq: Every day | ORAL | Status: DC
  Administered 2018-04-12 – 2018-04-13 (×2): 40 mg via ORAL
  Filled 2018-04-12 (×2): qty 1

## 2018-04-12 MED ORDER — MIDAZOLAM HCL 2 MG/2ML IJ SOLN
INTRAMUSCULAR | Status: DC | PRN
  Administered 2018-04-12: 2 mg via INTRAVENOUS

## 2018-04-12 NOTE — Progress Notes (Signed)
[facility] [place] NAME: [name]    MR#:  [id number]  DATE OF BIRTH:  September 07, 1960  SUBJECTIVE:  CHIEF COMPLAINT:   Chief Complaint  Patient presents with  . Chest Pain  Patient seen and evaluated today No complaints of chest pain Has some shortness of breath on exertion No cough No recent travel or sick contacts  REVIEW OF SYSTEMS:    ROS  CONSTITUTIONAL: No documented fever. No fatigue, weakness. No weight gain, no weight loss.  EYES: No blurry or double vision.  ENT: No tinnitus. No postnasal drip. No redness of the oropharynx.  RESPIRATORY: No cough, no wheeze, no hemoptysis. mild dyspnea.  CARDIOVASCULAR: No chest pain. No orthopnea. No palpitations. No syncope.  GASTROINTESTINAL: No nausea, no vomiting or diarrhea. No abdominal pain. No melena or hematochezia.  GENITOURINARY: No dysuria or hematuria.  ENDOCRINE: No polyuria or nocturia. No heat or cold intolerance.  HEMATOLOGY: No anemia. No bruising. No bleeding.  INTEGUMENTARY: No rashes. No lesions.  MUSCULOSKELETAL: No arthritis. No swelling. No gout.  NEUROLOGIC: No numbness, tingling, or ataxia. No seizure-type activity.  PSYCHIATRIC: No anxiety. No insomnia. No ADD.   DRUG ALLERGIES:   Allergies  Allergen Reactions  . Lisinopril     VITALS:  Blood pressure 139/78, pulse 88, temperature 97.8 F (36.6 C), temperature source Oral, resp. rate 19, height 5\' 5"  (1.651 m), weight 87.5 kg, last menstrual period 06/17/2002, SpO2 100 %.  PHYSICAL EXAMINATION:   Physical Exam  GENERAL:  58 y.o.-year-old patient lying in the bed with no acute distress.  EYES: Pupils equal, round, reactive to light and accommodation. No scleral icterus. Extraocular muscles intact.  HEENT: Head atraumatic, normocephalic. Oropharynx and nasopharynx clear.  NECK:  Supple, no jugular venous distention. No thyroid enlargement, no tenderness.  LUNGS: Normal breath sounds bilaterally,  no wheezing, rales, rhonchi. No use of accessory muscles of respiration.  CARDIOVASCULAR: S1, S2 normal. No murmurs, rubs, or gallops.  ABDOMEN: Soft, nontender, nondistended. Bowel sounds present. No organomegaly or mass.  EXTREMITIES: No cyanosis, clubbing or edema b/l.    NEUROLOGIC: Cranial nerves II through XII are intact. No focal Motor or sensory deficits b/l.   PSYCHIATRIC: The patient is alert and oriented x 3.  SKIN: No obvious rash, lesion, or ulcer.   LABORATORY PANEL:   CBC Recent Labs  Lab 04/12/18 0450  WBC 5.7  HGB 8.5*  HCT 26.1*  PLT 165   ------------------------------------------------------------------------------------------------------------------ Chemistries  Recent Labs  Lab 04/11/18 1723 04/12/18 0450  NA  --  144  K  --  3.8  CL  --  112*  CO2  --  27  GLUCOSE  --  100*  BUN  --  16  CREATININE  --  0.55  CALCIUM  --  7.8*  MG 1.9  --    ------------------------------------------------------------------------------------------------------------------  Cardiac Enzymes Recent Labs  Lab 04/12/18 0450  TROPONINI <0.03   ------------------------------------------------------------------------------------------------------------------  RADIOLOGY:  Dg Chest Portable 1 View  Result Date: 04/11/2018 CLINICAL DATA:  Intermittent chest pain beginning yesterday. EXAM: PORTABLE CHEST 1 VIEW COMPARISON:  None. FINDINGS: Lungs clear. Heart size normal. No pneumothorax or pleural fluid. No bony abnormality. IMPRESSION: Negative chest. Electronically Signed   By: Inge Rise M.D.   On: 04/11/2018 15:47     ASSESSMENT AND PLAN:  58 year old female patient with history of bronchial asthma, hypertension, hypothyroidism, thyroid cancer, depression currently under hospitalist service for chest pain and black stools  -Atypical chest  pain Serial troponin negative Status post cardiology evaluation No acute intervention recommended Check  echocardiogram  -Acute GI bleed Follow-up hemoglobin and hematocrit Gastroenterology evaluation Continue IV Protonix drip  -Hypokalemia improved  -Dyspnea Could be from bronchial asthma Bronchodilator therapy Check echocardiogram to assess for any heart failure  -DVT prophylaxis sequential compression device to lower extremities  All the records are reviewed and case discussed with Care Management/Social Worker. Management plans discussed with the patient, family and they are in agreement.  CODE STATUS: Full code  DVT Prophylaxis: SCDs  TOTAL TIME TAKING CARE OF THIS PATIENT: 35 minutes.   POSSIBLE D/C IN 1 to 2 DAYS, DEPENDING ON CLINICAL CONDITION.  Saundra Shelling M.D on 04/12/2018 at 11:35 AM  Between 7am to 6pm - Pager - [phone]  After 6pm go to [email or web address] - password [email or web address] [facility]  Office  [phone]  CC: Primary care physician; Clabe Seal, PA-C  Note: This dictation was prepared with Dragon dictation along with smaller phrase technology. Any transcriptional errors that result from this process are unintentional.

## 2018-04-12 NOTE — Anesthesia Preprocedure Evaluation (Signed)
Anesthesia Evaluation  Patient identified by MRN, date of birth, ID band Patient awake    Reviewed: Allergy & Precautions, H&P , NPO status , Patient's Chart, lab work & pertinent test results, reviewed documented beta blocker date and time   History of Anesthesia Complications Negative for: history of anesthetic complications  Airway Mallampati: III  TM Distance: >3 FB Neck ROM: full    Dental  (+) Dental Advidsory Given, Missing   Pulmonary shortness of breath (from anemia), asthma , neg sleep apnea, neg COPD, neg recent URI, former smoker,           Cardiovascular Exercise Tolerance: Good hypertension, + angina (from anemia) (-) CAD, (-) Past MI, (-) Cardiac Stents and (-) CABG (-) dysrhythmias (-) Valvular Problems/Murmurs     Neuro/Psych PSYCHIATRIC DISORDERS Depression negative neurological ROS     GI/Hepatic negative GI ROS, Neg liver ROS,   Endo/Other  neg diabetesHypothyroidism   Renal/GU negative Renal ROS  negative genitourinary   Musculoskeletal   Abdominal   Peds  Hematology negative hematology ROS (+)   Anesthesia Other Findings Past Medical History: No date: Allergy No date: Asthma No date: Chest pain     Comment:  a. 12/2010 ETT: No ST/T changes. No date: Depression No date: Deviated septum No date: Diastolic dysfunction     Comment:  a. 12/2010 Echo: EF 65-70%, no rwma, Gr2 DD, mild MR,               mildly dil LA, triv TR. No date: Herpes No date: HTN (hypertension) No date: Hypothyroid No date: Insomnia No date: Neuromuscular disorder [facility]) 2009: Thyroid cancer ([place])   Reproductive/Obstetrics negative OB ROS                             Anesthesia Physical Anesthesia Plan  ASA: II  Anesthesia Plan: General   Post-op Pain Management:    Induction: Intravenous  PONV Risk Score and Plan: 3 and Propofol infusion and TIVA  Airway Management Planned: Nasal  Cannula and Natural Airway  Additional Equipment:   Intra-op Plan:   Post-operative Plan:   Informed Consent: I have reviewed the patients History and Physical, chart, labs and discussed the procedure including the risks, benefits and alternatives for the proposed anesthesia with the patient or authorized representative who has indicated his/her understanding and acceptance.     Dental Advisory Given  Plan Discussed with: Anesthesiologist, CRNA and Surgeon  Anesthesia Plan Comments:         Anesthesia Quick Evaluation

## 2018-04-12 NOTE — Consult Note (Signed)
Jonathon Bellows , MD [address], [place], [place], [place], [address] [address], [place], [place], [place], [address] Phone: [phone]  Fax: [phone]  Consultation  Referring Provider:Dr Pyreddy Primary Care Physician:  Clabe Seal, PA-C Primary Gastroenterologist:  None           Reason for Consultation:     GI bleed   Date of Admission:  04/11/2018 Date of Consultation:  04/12/2018         HPI:   [name] is a 58 y.o. female was admitted on 04/11/2018 with chest pain and was incidentally found to be anemic.  She has had shortness of breath of 2 days duration.  Troponins have been negative since admission.  Hemoglobin on admission was 8.5 g previously about a year back hemoglobin was in the normal range between 14 and 15 g.  He has been seen by cardiology and they are considering an outpatient stress test.  She has been cleared for an upper endoscopy.  Last echo per cardiology showed normal LV systolic function.  I was made aware of the consult by Dr Alice Reichert at 8.38 am . I came into the room to see the patient at 1.30 pm . Since morning the patient had threatened to leave on a few occasions if not seen asap. I had relayed that I was in the hospital attending to an emergency and would be there as soon as possible. I had been in touch with Dr Estanislado Pandy during the interim. The patient had been admitted with shortness of breath with a normal cardiac work up and CXR so far. I went into the room to discuss with the patient regarding the shortness of breath to get more information since we are in the midst of the Pandemic. She said she had black tarry stool since admission, some abdominal discomfort, had been taking regular Advil for a period of time.No bowel movement today which I reassured her makes her less likely to be actively bleeding. I explained that I would need to get in touch with ID if we needed to rule her out for COVID in view of her shortness of breath. She was very upset  and wanted to speak with the nursing director . We went back in and she wanted to know why we didn't plan for her procedure this morning which I tried to explain that we needed cardiology to clear her which they had this morning . In addition since she was stable usually the procedure should be done within 24 hours of admission. . She had also had something to drink as late as 11.15 am . I discussed with Dr Delaine Lame - did not feel we need to rule out COVID for shortness of breath alone.   Patient was not providing me an opportunity to explain why I did not see her earlier than at the moment and why she didn't have an EGD early this morning. She was very upset.  CBC Latest Ref Rng & Units 04/12/2018 04/11/2018 02/12/2017  WBC 4.0 - 10.5 K/uL 5.7 7.6 5.6  Hemoglobin 12.0 - 15.0 g/dL 8.5(L) 9.4(L) 14.4  Hematocrit 36.0 - 46.0 % 26.1(L) 28.1(L) 44.6  Platelets 150 - 400 K/uL 165 209 -      Past Medical History:  Diagnosis Date   Allergy    Asthma    Chest pain    a. 12/2010 ETT: No ST/T changes.   Depression    Deviated septum    Diastolic dysfunction  a. 12/2010 Echo: EF 65-70%, no rwma, Gr2 DD, mild MR, mildly dil LA, triv TR.   Herpes    HTN (hypertension)    Hypothyroid    Insomnia    Neuromuscular disorder ([place])    Thyroid cancer ([place]) 2009    Past Surgical History:  Procedure Laterality Date   CESAREAN SECTION     CHOLECYSTECTOMY  03/26/2006   DILATION AND CURETTAGE OF UTERUS  06/30/2000   THYROIDECTOMY     PARATHYROIDECTOMY    Prior to Admission medications   Medication Sig Start Date End Date Taking? Authorizing Provider  acyclovir (ZOVIRAX) 200 MG capsule TAKE 2 CAPSULES BY MOUTH 2 TIMES DAILY. 01/12/18  Yes [name], MD  amphetamine-dextroamphetamine (ADDERALL) 20 MG tablet Take 20 mg by mouth 2 (two) times daily. 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 07/11/17  Yes [name], MD  calcitRIOL (ROCALTROL) 0.25 MCG capsule TAKE 2 CAPSULES (0.5 MCG TOTAL) BY MOUTH DAILY. 01/12/18  Yes [name], MD  clonazePAM (KLONOPIN) 1 MG tablet Take 1 tablet (1 mg total) by mouth 3 (three) times daily as needed. 01/12/18  Yes [name], MD  Fluocinolone Acetonide 0.01 % OIL Place 5 drops in ear(s) 2 (two) times daily. 01/12/18  Yes [name], MD  levothyroxine (SYNTHROID, LEVOTHROID) 137 MCG tablet Take 1 tablet (137 mcg total) by mouth daily before breakfast. 01/12/18  Yes [name], MD  montelukast (SINGULAIR) 10 MG tablet TAKE 1 TABLET (10 MG TOTAL) BY MOUTH AT BEDTIME. Patient taking differently: Take 10 mg by mouth daily.  12/14/17  Yes [name], MD  olmesartan (BENICAR) 20 MG tablet Take 1 tablet (20 mg total) by mouth daily. Patient taking differently: Take 40 mg by mouth daily.  01/12/18  Yes [name], MD  oxyCODONE-acetaminophen (PERCOCET) 10-325 MG tablet Take 1 tablet by mouth every 6 (six) hours as needed for pain. 01/12/18  Yes [name], MD  rizatriptan (MAXALT) 10 MG tablet Take 1 tablet (10 mg total) by mouth as needed for migraine. May repeat in 2 hours if needed 12/21/17  Yes [name], MD  sertraline (ZOLOFT) 50 MG tablet Take 150 mg by mouth daily.    Yes [provider]  triamterene-hydrochlorothiazide (DYAZIDE) 37.5-25 MG capsule Take 1 each (1 capsule total) by mouth daily. 01/12/18  Yes [name], MD  verapamil (CALAN-SR) 180 MG CR tablet Take 2 tablets (360 mg total) by mouth at bedtime. Patient taking differently: Take 360 mg by mouth daily.  07/11/17  Yes [name], MD  albuterol (PROVENTIL HFA;VENTOLIN HFA) 108 (90 Base) MCG/ACT inhaler TAKE 2 PUFFS BY MOUTH EVERY 4 HOURS AS NEEDED FOR WHEEZE 12/14/17   [name], MD    Family History  Problem Relation Age of  Onset   Heart disease Father        CABG in his 20s   Hyperlipidemia Father    Heart disease Brother        CABG   Heart disease Sister        Stents in 2s   Hyperlipidemia Sister    Heart disease Sister        CABG in 45s  Hyperlipidemia Sister    Hyperlipidemia Mother    Breast cancer Maternal Aunt    Hyperlipidemia Maternal Uncle    Hyperlipidemia Maternal Grandmother    Breast cancer Maternal Grandmother    Hyperlipidemia Paternal Grandmother    Breast cancer Paternal Grandmother      Social History   Tobacco Use   Smoking status: Former Smoker    Packs/day: 2.00    Years: 20.00    Pack years: 40.00    Last attempt to quit: 05/13/1990    Years since quitting: 27.9   Smokeless tobacco: Never Used  Substance Use Topics   Alcohol use: No    Comment: Rarely   Drug use: No    Allergies as of 04/11/2018 - Review Complete 04/11/2018  Allergen Reaction Noted   Lisinopril  05/13/2011    Review of Systems:    All systems reviewed and negative except where noted in HPI.   Physical Exam:  Vital signs in last 24 hours: Temp:  [97.7 F (36.5 C)-98.3 F (36.8 C)] 97.8 F (36.6 C) (03/23 0725) Pulse Rate:  [77-90] 88 (03/23 0725) Resp:  [13-20] 19 (03/23 0725) BP: (130-154)/(66-86) 139/78 (03/23 0725) SpO2:  [97 %-100 %] 100 % (03/23 0725) Weight:  [87.5 kg-88.5 kg] 87.5 kg (03/23 0553) Last BM Date: 04/11/18 General:   Pleasant, cooperative in NAD Head:  Normocephalic and atraumatic. Eyes:   No icterus.   Conjunctiva pink. PERRLA. Ears:  Normal auditory acuity. Neck:  Supple; no masses or thyroidomegaly Lungs: Respirations even and unlabored. Lungs clear to auscultation bilaterally.   No wheezes, crackles, or rhonchi.  Heart:  Regular rate and rhythm;  Without murmur, clicks, rubs or gallops Abdomen:  Soft, nondistended, nontender. Normal bowel sounds. No appreciable masses or hepatomegaly.  No rebound or guarding.  Neurologic:  Alert and  oriented x3;  grossly normal neurologically. Skin:  Intact without significant lesions or rashes. Cervical Nodes:  No significant cervical adenopathy. Psych:  Alert and cooperative. Normal affect.  LAB RESULTS: Recent Labs    04/11/18 1518 04/12/18 0450  WBC 7.6 5.7  HGB 9.4* 8.5*  HCT 28.1* 26.1*  PLT 209 165   BMET Recent Labs    04/11/18 1518 04/12/18 0450  NA 140 144  K 2.9* 3.8  CL 105 112*  CO2 26 27  GLUCOSE 96 100*  BUN 17 16  CREATININE 0.81 0.55  CALCIUM 7.9* 7.8*   LFT No results for input(s): PROT, ALBUMIN, AST, ALT, ALKPHOS, BILITOT, BILIDIR, IBILI in the last 72 hours. PT/INR No results for input(s): LABPROT, INR in the last 72 hours.  STUDIES: Dg Chest Portable 1 View  Result Date: 04/11/2018 CLINICAL DATA:  Intermittent chest pain beginning yesterday. EXAM: PORTABLE CHEST 1 VIEW COMPARISON:  None. FINDINGS: Lungs clear. Heart size normal. No pneumothorax or pleural fluid. No bony abnormality. IMPRESSION: Negative chest. Electronically Signed   By: Inge Rise M.D.   On: 04/11/2018 15:47      Impression / Plan:   JEVON LITTLEPAGE is a 58 y.o. y/o female admitted with shortness of breath, chest pain and black tarry stools. She has been seen by cardiology and cleared for the procedure. No hematemesis. Likely GI bleed due to NSAID use.    Plan  1. Stop NSAID use.  2. H pylori stool antigen  3. PPI  4. EGD  I have discussed alternative options, risks & benefits,  which include, but are not limited to, bleeding, infection, perforation,respiratory complication & drug reaction.  The  patient agrees with this plan & written consent will be obtained.     Thank you for involving me in the care of this patient.      LOS: 1 day   Jonathon Bellows, MD  04/12/2018, 12:48 PM

## 2018-04-12 NOTE — Transfer of Care (Signed)
Immediate Anesthesia Transfer of Care Note  Patient: [name]  Procedure(s) Performed: ESOPHAGOGASTRODUODENOSCOPY (EGD) WITH PROPOFOL (N/A )  Patient Location: Endoscopy Unit  Anesthesia Type:General  Level of Consciousness: drowsy and patient cooperative  Airway & Oxygen Therapy: Patient Spontanous Breathing  Post-op Assessment: Report given to RN and Post -op Vital signs reviewed and stable  Post vital signs: Reviewed and stable  Last Vitals:  Vitals Value Taken Time  BP 100/56 04/12/2018  3:35 PM  Temp 36.4 C 04/12/2018  3:30 PM  Pulse 85 04/12/2018  3:39 PM  Resp 15 04/12/2018  3:39 PM  SpO2 98 % 04/12/2018  3:39 PM  Vitals shown include unvalidated device data.  Last Pain:  Vitals:   04/12/18 1530  TempSrc: Axillary  PainSc:          Complications: No apparent anesthesia complications

## 2018-04-12 NOTE — Anesthesia Postprocedure Evaluation (Signed)
Anesthesia Post Note  Patient: [name]  Procedure(s) Performed: ESOPHAGOGASTRODUODENOSCOPY (EGD) WITH PROPOFOL (N/A )  Patient location during evaluation: Endoscopy Anesthesia Type: General Level of consciousness: awake and alert Pain management: pain level controlled Vital Signs Assessment: post-procedure vital signs reviewed and stable Respiratory status: spontaneous breathing, nonlabored ventilation, respiratory function stable and patient connected to nasal cannula oxygen Cardiovascular status: blood pressure returned to baseline and stable Postop Assessment: no apparent nausea or vomiting Anesthetic complications: no     Last Vitals:  Vitals:   04/12/18 1550 04/12/18 1600  BP: (!) 110/57 104/61  Pulse:  87  Resp:  20  Temp:    SpO2:  98%    Last Pain:  Vitals:   04/12/18 1530  TempSrc: Axillary  PainSc:                  Martha Clan

## 2018-04-12 NOTE — Anesthesia Post-op Follow-up Note (Signed)
Anesthesia QCDR form completed.        

## 2018-04-12 NOTE — Progress Notes (Signed)
Patient off floor at this time for endoscopy. Report given to GI.

## 2018-04-12 NOTE — Op Note (Signed)
[facility] Gastroenterology Patient Name: [name] Procedure Date: 04/12/2018 3:15 PM MRN: [id number] Account #: [id number] Date of Birth: 1960/07/28 Admit Type: Inpatient Age: 58 Room: [facility] ENDO ROOM 3 Gender: Female Note Status: Finalized Procedure:            Upper GI endoscopy Indications:          Melena Providers:            Jonathon Bellows MD, MD Referring MD:         No Local Md, MD (Referring MD) Medicines:            Monitored Anesthesia Care Complications:        No immediate complications. Procedure:            Pre-Anesthesia Assessment:                       - Prior to the procedure, a History and Physical was                        performed, and patient medications, allergies and                        sensitivities were reviewed. The patient's tolerance of                        previous anesthesia was reviewed.                       - The risks and benefits of the procedure and the                        sedation options and risks were discussed with the                        patient. All questions were answered and informed                        consent was obtained.                       - ASA Grade Assessment: II - A patient with mild                        systemic disease.                       After obtaining informed consent, the endoscope was                        passed under direct vision. Throughout the procedure,                        the patient's blood pressure, pulse, and oxygen                        saturations were monitored continuously. The Endoscope                        was introduced through the mouth, and advanced to the  third part of duodenum. The upper GI endoscopy was                        accomplished with ease. The patient tolerated the                        procedure well. Findings:      The examined duodenum was normal.      The esophagus was normal.      One non-bleeding  cratered gastric ulcer with a clean ulcer base (Forrest       Class III) was found in the gastric antrum. The lesion was 15 mm in       largest dimension.      The cardia and gastric fundus were normal on retroflexion. Impression:           - Normal examined duodenum.                       - Normal esophagus.                       - Non-bleeding gastric ulcer with a clean ulcer base                        (Forrest Class III).                       - No specimens collected. Recommendation:       - Return patient to hospital ward for ongoing care.                       - Mechanical soft diet today.                       - Monitor CBC if stable home in am                       Check stool H pylori antigen                       Ulcer likely secondary to NSAID's                       Stop NSAID's                       Repeat EGD in 6 weeks to check for healing                       Discharge on PPI omeprazole 40 mg once a day Procedure Code(s):    --- Professional ---                       [phone], Esophagogastroduodenoscopy, flexible, transoral;                        diagnostic, including collection of specimen(s) by                        brushing or washing, when performed (separate procedure) Diagnosis Code(s):    --- Professional ---  K25.9, Gastric ulcer, unspecified as acute or chronic,                        without hemorrhage or perforation                       K92.1, Melena (includes Hematochezia) CPT copyright 2018 American Medical Association. All rights reserved. The codes documented in this report are preliminary and upon coder review may  be revised to meet current compliance requirements. Jonathon Bellows, MD Jonathon Bellows MD, MD 04/12/2018 3:34:27 PM This report has been signed electronically. Number of Addenda: 0 Note Initiated On: 04/12/2018 3:15 PM      [facility]

## 2018-04-12 NOTE — H&P (Signed)
Jonathon Bellows, MD [address], [place], [place], [place], [address] [address], [place], [place], [place], [address] Phone: [phone]  Fax: [phone]  Primary Care Physician:  Clabe Seal, PA-C   Pre-Procedure History & Physical: HPI:  [name] is a 58 y.o. female is here for an endoscopy    Past Medical History:  Diagnosis Date  . Allergy   . Asthma   . Chest pain    a. 12/2010 ETT: No ST/T changes.  . Depression   . Deviated septum   . Diastolic dysfunction    a. 12/2010 Echo: EF 65-70%, no rwma, Gr2 DD, mild MR, mildly dil LA, triv TR.  Marland Kitchen Herpes   . HTN (hypertension)   . Hypothyroid   . Insomnia   . Neuromuscular disorder ([place])   . Thyroid cancer ([place]) 2009    Past Surgical History:  Procedure Laterality Date  . CESAREAN SECTION    . CHOLECYSTECTOMY  03/26/2006  . DILATION AND CURETTAGE OF UTERUS  06/30/2000  . THYROIDECTOMY     PARATHYROIDECTOMY    Prior to Admission medications   Medication Sig Start Date End Date Taking? Authorizing Provider  acyclovir (ZOVIRAX) 200 MG capsule TAKE 2 CAPSULES BY MOUTH 2 TIMES DAILY. 01/12/18  Yes [name], MD  amphetamine-dextroamphetamine (ADDERALL) 20 MG tablet Take 20 mg by mouth 2 (two) times daily. 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 07/11/17  Yes [name], MD  calcitRIOL (ROCALTROL) 0.25 MCG capsule TAKE 2 CAPSULES (0.5 MCG TOTAL) BY MOUTH DAILY. 01/12/18  Yes [name], MD  clonazePAM (KLONOPIN) 1 MG tablet Take 1 tablet (1 mg total) by mouth 3 (three) times daily as needed. 01/12/18  Yes [name], MD  Fluocinolone Acetonide 0.01 % OIL Place 5 drops in ear(s) 2 (two) times daily. 01/12/18  Yes [name], MD  levothyroxine (SYNTHROID, LEVOTHROID) 137  MCG tablet Take 1 tablet (137 mcg total) by mouth daily before breakfast. 01/12/18  Yes [name], MD  montelukast (SINGULAIR) 10 MG tablet TAKE 1 TABLET (10 MG TOTAL) BY MOUTH AT BEDTIME. Patient taking differently: Take 10 mg by mouth daily.  12/14/17  Yes [name], MD  olmesartan (BENICAR) 20 MG tablet Take 1 tablet (20 mg total) by mouth daily. Patient taking differently: Take 40 mg by mouth daily.  01/12/18  Yes [name], MD  oxyCODONE-acetaminophen (PERCOCET) 10-325 MG tablet Take 1 tablet by mouth every 6 (six) hours as needed for pain. 01/12/18  Yes [name], MD  rizatriptan (MAXALT) 10 MG tablet Take 1 tablet (10 mg total) by mouth as needed for migraine. May repeat in 2 hours if needed 12/21/17  Yes [name], MD  sertraline (ZOLOFT) 50 MG tablet Take 150 mg by mouth daily.    Yes [provider]  triamterene-hydrochlorothiazide (DYAZIDE) 37.5-25 MG capsule Take 1 each (1 capsule total) by mouth daily. 01/12/18  Yes [name], MD  verapamil (CALAN-SR) 180 MG CR tablet Take 2 tablets (360 mg total) by mouth at bedtime. Patient taking differently: Take 360 mg by mouth daily.  07/11/17  Yes [name], MD  albuterol (PROVENTIL HFA;VENTOLIN HFA) 108 (90 Base) MCG/ACT  inhaler TAKE 2 PUFFS BY MOUTH EVERY 4 HOURS AS NEEDED FOR WHEEZE 12/14/17   [name], MD    Allergies as of 04/11/2018 - Review Complete 04/11/2018  Allergen Reaction Noted  . Lisinopril  05/13/2011    Family History  Problem Relation Age of Onset  . Heart disease Father        CABG in his 58s  . Hyperlipidemia Father   . Heart disease Brother        CABG  . Heart disease Sister        Stents in 50s  . Hyperlipidemia Sister   . Heart disease Sister        CABG in 35s  . Hyperlipidemia Sister   . Hyperlipidemia Mother   . Breast cancer Maternal Aunt   . Hyperlipidemia Maternal Uncle   . Hyperlipidemia Maternal Grandmother   . Breast cancer Maternal Grandmother   . Hyperlipidemia  Paternal Grandmother   . Breast cancer Paternal Grandmother     Social History   Socioeconomic History  . Marital status: Married    Spouse name: Micheal  . Number of children: 1  . Years of education: Not on file  . Highest education level: Not on file  Occupational History    Comment: Teach elementary school  Social Needs  . Financial resource strain: Not on file  . Food insecurity:    Worry: Not on file    Inability: Not on file  . Transportation needs:    Medical: Not on file    Non-medical: Not on file  Tobacco Use  . Smoking status: Former Smoker    Packs/day: 2.00    Years: 20.00    Pack years: 40.00    Last attempt to quit: 05/13/1990    Years since quitting: 27.9  . Smokeless tobacco: Never Used  Substance and Sexual Activity  . Alcohol use: No    Comment: Rarely  . Drug use: No  . Sexual activity: Yes  Lifestyle  . Physical activity:    Days per week: Not on file    Minutes per session: Not on file  . Stress: Not on file  Relationships  . Social connections:    Talks on phone: Not on file    Gets together: Not on file    Attends religious service: Not on file    Active member of club or organization: Not on file    Attends meetings of clubs or organizations: Not on file    Relationship status: Not on file  . Intimate partner violence:    Fear of current or ex partner: Not on file    Emotionally abused: Not on file    Physically abused: Not on file    Forced sexual activity: Not on file  Other Topics Concern  . Not on file  Social History Narrative   Lives in [place] w/ her husband. 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    Review of Systems: See HPI, otherwise negative ROS  Physical Exam: BP 129/73   Pulse 83   Temp 98.7 F (37.1 C) (Oral)   Resp 18   Ht 5\' 5"  (1.651 m)   Wt 87.5 kg   LMP 06/17/2002   SpO2 98%   BMI 32.12 kg/m  General:   Alert,  pleasant and cooperative in NAD Head:  Normocephalic and atraumatic.  Neck:  Supple; no masses or thyromegaly. Lungs:  Clear throughout to auscultation, normal respiratory effort.    Heart:  +S1, +  S2, Regular rate and rhythm, No edema. Abdomen:  Soft, nontender and nondistended. Normal bowel sounds, without guarding, and without rebound.   Neurologic:  Alert and  oriented x4;  grossly normal neurologically.  Impression/Plan: [name] is here for an endoscopy  to be performed for  evaluation of melena    Risks, benefits, limitations, and alternatives regarding endoscopy have been reviewed with the patient.  Questions have been answered.  All parties agreeable.   Jonathon Bellows, MD  04/12/2018, 3:16 PM

## 2018-04-12 NOTE — Consult Note (Addendum)
Cardiology Consult    Patient ID: MIKAEL DEBELL MRN: [id number], DOB/AGE: [birth year]   Admit date: 04/11/2018 Date of Consult: 04/12/2018  Primary Physician: Clabe Seal, PA-C Primary Cardiologist: Kathlyn Sacramento, MD Requesting Provider: Neta Mends, MD  Patient Profile    [name] is a 58 y.o. female with a history of atypical c/p (nl w/u 2012), HTN, depression, asthma, thyroid cancer w/ resultant hypothyroidism, and diastolic dysfxn, who is being seen today for the evaluation of chest pain in the setting of anemia and melena at the request of Dr. Estanislado Pandy.  Past Medical History   Past Medical History:  Diagnosis Date  . Allergy   . Asthma   . Chest pain    a. 12/2010 ETT: No ST/T changes.  . Depression   . Deviated septum   . Diastolic dysfunction    a. 12/2010 Echo: EF 65-70%, no rwma, Gr2 DD, mild MR, mildly dil LA, triv TR.  Marland Kitchen Herpes   . HTN (hypertension)   . Hypothyroid   . Insomnia   . Neuromuscular disorder ([place])   . Thyroid cancer ([place]) 2009    Past Surgical History:  Procedure Laterality Date  . CESAREAN SECTION    . CHOLECYSTECTOMY  03/26/2006  . DILATION AND CURETTAGE OF UTERUS  06/30/2000  . THYROIDECTOMY     PARATHYROIDECTOMY     Allergies  Allergies  Allergen Reactions  . Lisinopril     History of Present Illness    58 y/o ? with the above PMH including asthma, depression, HTN, thyroid cancer, and hypothyroidism.  She lives in [place] with her husband and is reasonably active @ baseline.  She works full-time as a Licensed conveyancer (stressful).  She was prev eval for c/p in 2012 in the setting of atypical symptoms.  ETT @ that time was nl, while echo showed nl EF w/ grade 2 DD.  She has not required cardiology f/u since then.    She was in her usoh until Friday 3/20, when she began to note dark stools.  She worked a full day and says that she "wasn't feeling great" in the setting of significant stress @ work related to [employer].  She  denies fever/chills/cough/anorexia.  She says that she's been taking an 'immune boost' vitamin supplement over the prior week and that she thought perhaps the vitamin ingredients were to blame.  On the morning of 3/21, she was walking in her home and became acutely dyspneic.  Once she was able to sit, her dyspnea resolved within a few mins but any subsequent exertion resulted in recurrent DOE.  She was not having any dyspnea at rest and checked her temperature frequently and noted that she was afebrile.  By the evening of 3/21, she began to experience 5-6/1- retrosternal chest pressure.  This did not necessarily worsen with exertion, though DOE persisted.  She was able to fall asleep with ongoing chest pressure, but awoke several times through the night with left lower chest sharp pain that moved into her left back.  This would last up to an hour.  When she finally got up on the am of 3/22, she continued to have chest pressure and DOE, thus prompting her to present to the [facility] ED that afternoon.  In the ED, ECG was w/o acute changes.  Trop nl.  CXR w/o acute findings.  H/H 9.4/28.1 (was 14.4/44.6 in January 2019).  K 2.9.  She was placed on Protonix gtt and provided ntg w/ resolution of chest  pressure ~ 1-2 hrs after arrival.  She is chest pressure free this AM but remains dyspneic w/ minimal activity in her rorm. H/H lower this AM @ 8.5/26.1  Despite prolonged symptoms 3/21-3/22, troponins remain normal.  Of note, she reports chronic right neck/shoulder discomfort and uses ibuprofen 200-400 mg most days of the week.  Inpatient Medications    . calcitRIOL  0.25 mcg Oral Daily  . irbesartan  75 mg Oral Daily  . ketotifen  1 drop Both Eyes BID  . levothyroxine  137 mcg Oral Q0600  . montelukast  10 mg Oral Daily  . [START ON 04/15/2018] pantoprazole  40 mg Intravenous Q12H  . sertraline  150 mg Oral Daily  . triamterene-hydrochlorothiazide  1 tablet Oral Daily  . verapamil  360 mg Oral Daily    Family  History    Family History  Problem Relation Age of Onset  . Heart disease Father        CABG in his 11s  . Hyperlipidemia Father   . Heart disease Brother        CABG  . Heart disease Sister        Stents in 13s  . Hyperlipidemia Sister   . Heart disease Sister        CABG in 14s  . Hyperlipidemia Sister   . Hyperlipidemia Mother   . Breast cancer Maternal Aunt   . Hyperlipidemia Maternal Uncle   . Hyperlipidemia Maternal Grandmother   . Breast cancer Maternal Grandmother   . Hyperlipidemia Paternal Grandmother   . Breast cancer Paternal Grandmother    She indicated that her mother is deceased. She indicated that her father is deceased. She indicated that the status of her brother is unknown. She indicated that the status of her maternal grandmother is unknown. She indicated that the status of her paternal grandmother is unknown. She indicated that the status of her maternal aunt is unknown. She indicated that the status of her maternal uncle is unknown.  Social History    Social History   Socioeconomic History  . Marital status: Married    Spouse name: Micheal  . Number of children: 1  . Years of education: Not on file  . Highest education level: Not on file  Occupational History    Comment: Teach elementary school  Social Needs  . Financial resource strain: Not on file  . Food insecurity:    Worry: Not on file    Inability: Not on file  . Transportation needs:    Medical: Not on file    Non-medical: Not on file  Tobacco Use  . Smoking status: Former Smoker    Packs/day: 2.00    Years: 20.00    Pack years: 40.00    Last attempt to quit: 05/13/1990    Years since quitting: 27.9  . Smokeless tobacco: Never Used  Substance and Sexual Activity  . Alcohol use: No    Comment: Rarely  . Drug use: No  . Sexual activity: Yes  Lifestyle  . Physical activity:    Days per week: Not on file    Minutes per session: Not on file  . Stress: Not on file  Relationships  .  Social connections:    Talks on phone: Not on file    Gets together: Not on file    Attends religious service: Not on file    Active member of club or organization: Not on file    Attends meetings of clubs or organizations: Not  on file    Relationship status: Not on file  . Intimate partner violence:    Fear of current or ex partner: Not on file    Emotionally abused: Not on file    Physically abused: Not on file    Forced sexual activity: Not on file  Other Topics Concern  . Not on file  Social History Narrative   Lives in [place] w/ her husband. 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     Review of Systems    General:  No chills, fever, night sweats or weight changes.  Cardiovascular:  +++ chest pain, +++ dyspnea on exertion, no edema, orthopnea, palpitations, paroxysmal nocturnal dyspnea. Dermatological: No rash, lesions/masses Respiratory: No cough, +++ dyspnea Urologic: No hematuria, dysuria Abdominal:   +++ melena.  No nausea, vomiting, diarrhea, bright red blood per rectum, or hematemesis Neurologic:  No visual changes, wkns, changes in mental status. All other systems reviewed and are otherwise negative except as noted above.  Physical Exam    Blood pressure 139/78, pulse 88, temperature 97.8 F (36.6 C), temperature source Oral, resp. rate 19, height 5\' 5"  (1.651 m), weight 87.5 kg, last menstrual period 06/17/2002, SpO2 100 %.  General: Pleasant, NAD Psych: Normal affect. Neuro: Alert and oriented X 3. Moves all extremities spontaneously. HEENT: Normal  Neck: Supple without bruits or JVD. Lungs:  Resp regular and unlabored, CTA. Heart: RRR no s3, s4, or murmurs. Abdomen: Soft, non-distended, BS + x 4. Mild epigastric tenderness upon deep palpation. Extremities: No clubbing, cyanosis or edema. DP/PT/Radials 2+ and equal bilaterally.  Labs    Troponin  Recent Labs    04/11/18 1518 04/11/18 1723 04/11/18 2321 04/12/18 0450  TROPONINI <0.03 <0.03  <0.03 <0.03   Lab Results  Component Value Date   WBC 5.7 04/12/2018   HGB 8.5 (L) 04/12/2018   HCT 26.1 (L) 04/12/2018   MCV 86.7 04/12/2018   PLT 165 04/12/2018    Lab Results  Component Value Date   CHOL CANCELED 05/19/2016   HDL CANCELED 05/19/2016   LDLCALC 90 08/16/2012   TRIG CANCELED 05/19/2016     Radiology Studies    Dg Chest Portable 1 View  Result Date: 04/11/2018 CLINICAL DATA:  Intermittent chest pain beginning yesterday. EXAM: PORTABLE CHEST 1 VIEW COMPARISON:  None. FINDINGS: Lungs clear. Heart size normal. No pneumothorax or pleural fluid. No bony abnormality. IMPRESSION: Negative chest. Electronically Signed   By: Inge Rise M.D.   On: 04/11/2018 15:47    ECG & Cardiac Imaging    RSR, 94, LAE, no acute ST/T changes - personally reviewed.  Assessment & Plan    1.  Chest pain/DOE: Pt w/ prior h/o atypical c/p and nl ETT & echo in 2012, admitted 3/21 with a 2 day h/o melena and 1 day h/o DOE and left chest pressure.  ECG in ED non-acute and despite prolonged Ss, trop nl.  Found to be anemic w/ H/H 9.4/28.1  8.5/26.1. Symptoms improved with addition of IV protonix.  No chest pressure this AM.  Cont to have DOE. Prev nl EF by echo in 2012 w/ grade 2 DD.  Euvolemic on exam.  Pain and dyspnea most likely 2/2 PUD and anemia.  In setting of dyspnea, will obtain echo to eval EF.  If nl, would not pursue additional ischemic eval @ this time, but could reconsider in the future if symptoms persist despite adequate treatment of presumed PUD and anemia.  Ok to proceed w/ GI procedures if necessary.  2.  Melena/GIB:  Pt with a h/o right neck/should discomfort and has been using ibuprofen most days of the week.  Began to note dark stools on 3/20, which she initially thought was 2/2 using vitamins over the past week or so.  Hgb down to 8.5/26.1 this AM.  Chest pain improved on IV PPI.  GI to see this AM.  3.  Anemia:  See #2.  4.  Essential HTN: Stable on ARB, ccb, and  diuretic.  5.  Hypokalemia:  K 2.9 on arrival in setting of diuretic therapy.  Better this AM.  6.  Hypothyroidism:  TSH wnl.  Signed, Murray Hodgkins, NP 04/12/2018, 9:04 AM  For questions or updates, please contact   Please consult [email or web address] for contact info under Cardiology/STEMI.

## 2018-04-12 NOTE — Progress Notes (Signed)
*  PRELIMINARY RESULTS* Echocardiogram 2D Echocardiogram has been performed.  [name] 04/12/2018, 11:32 AM

## 2018-04-13 LAB — HEMOGLOBIN AND HEMATOCRIT, BLOOD
HCT: 27.6 % — ABNORMAL LOW (ref 36.0–46.0)
Hemoglobin: 9 g/dL — ABNORMAL LOW (ref 12.0–15.0)

## 2018-04-13 LAB — HIV ANTIBODY (ROUTINE TESTING W REFLEX): HIV Screen 4th Generation wRfx: NONREACTIVE

## 2018-04-13 MED ORDER — PANTOPRAZOLE SODIUM 40 MG PO TBEC: 40.0000 mg | DELAYED_RELEASE_TABLET | Freq: Two times a day (BID) | ORAL | 11 refills | Status: DC

## 2018-04-13 NOTE — Progress Notes (Signed)
Advanced care plan. Purpose of the Encounter: CODE STATUS Parties in Attendance: Patient Patient's Decision Capacity: Good Subjective/Patient's story: [name]  is a 58 y.o. female with a known history of asthma, depression, hypertension, hypothyroidism, history of thyroid cancer who is presenting to the emergency room with complaint of chest pain.  Patient states that her symptoms started yesterday.  She states that these started all of a sudden when she tried to walk she got very short of breath.  She also started having chest pain on the left side of her chest.  She described it as a pressure type of pain.  She also states that today she had radiation of the pain in her arm.  Son noticed that she has been having tarry stools ongoing for the past few days.  In the ER patient now is noted to be newly anemic compared to hemoglobin past year.  GI physician has started her on a Protonix drip. Objective/Medical story Patient needs gastroenterology evaluation and work-up for GI bleed.  Will need endoscopy.  Needs cardiology evaluation and echocardiogram. Goals of care determination:  Advance care directives goals of care and treatment plan discussed Patient wants everything done which includes CPR, intubation ventilator if the need arises CODE STATUS: Full code Time spent discussing advanced care planning: 16 minutes

## 2018-04-13 NOTE — Progress Notes (Signed)
[place] at [place] was admitted to the Hospital on 04/11/2018 and Discharged  04/13/2018 and should be excused from work/school for above days and may return to work/school without any restrictions from 04/14/2018.  Call Saundra Shelling MD with questions.  Saundra Shelling M.D on 04/13/2018,at 10:46 AM  [place] at [facility]  [phone]

## 2018-04-13 NOTE — Progress Notes (Signed)
Pt is refusing a.m. vital signs and medications until later in the morning.

## 2018-04-13 NOTE — Discharge Summary (Signed)
[place] at [place] NAME: [name]    MR#:  [id number]  DATE OF BIRTH:  Nov 16, 1960  DATE OF ADMISSION:  04/11/2018 ADMITTING PHYSICIAN: Dustin Flock, MD  DATE OF DISCHARGE: 04/13/2018 12:00 PM  PRIMARY CARE PHYSICIAN: Clabe Seal, PA-C   ADMISSION DIAGNOSIS:  Shortness of breath [R06.02] Melena [K92.1] Anemia, unspecified type [D64.9]  DISCHARGE DIAGNOSIS:  Atypical chest pain Acute gastrointestinal bleeding secondary to gastric ulcer Anemia  SECONDARY DIAGNOSIS:   Past Medical History:  Diagnosis Date  . Allergy   . Asthma   . Chest pain    a. 12/2010 ETT: No ST/T changes.  . Depression   . Deviated septum   . Diastolic dysfunction    a. 12/2010 Echo: EF 65-70%, no rwma, Gr2 DD, mild MR, mildly dil LA, triv TR.  Marland Kitchen Herpes   . HTN (hypertension)   . Hypothyroid   . Insomnia   . Neuromuscular disorder ([place])   . Thyroid cancer [facility]) 2009     ADMITTING HISTORY  [name]  is a 58 y.o. female with a known history of asthma, depression, hypertension, hypothyroidism, history of thyroid cancer who is presenting to the emergency room with complaint of chest pain.  Patient states that her symptoms started yesterday.  She states that these started all of a sudden when she tried to walk she got very short of breath.  She also started having chest pain on the left side of her chest.  She described it as a pressure type of pain.  She also states that today she had radiation of the pain in her arm.  Son noticed that she has been having tarry stools ongoing for the past few days.  In the ER patient now is noted to be newly anemic compared to hemoglobin past year.  GI physician has started her on a Protonix drip.   HOSPITAL COURSE:  Patient was admitted to medical floor.  Started on IV Protonix drip.  Serial troponins were negative.  Cardiology evaluated the patient and recommended no intervention.  Patient was worked up with  echocardiogram.  Patient was seen by gastroenterology and had upper endoscopy which showed gastric ulcer.  Patient was switched to oral Protonix.  Hemoglobin has been stable and she did not need any blood transfusion.  Patient advised to stop all blood thinner medications and NSAIDs.  Continue oral proton pump inhibitor twice daily.  Follow-up with gastroenterology in the clinic.  CONSULTS OBTAINED:  Treatment Team:  Wellington Hampshire, MD Jonathon Bellows, MD  DRUG ALLERGIES:   Allergies  Allergen Reactions  . Lisinopril     DISCHARGE MEDICATIONS:   Allergies as of 04/13/2018      Reactions   Lisinopril       Medication List    TAKE these medications   acyclovir 200 MG capsule Commonly known as:  ZOVIRAX TAKE 2 CAPSULES BY MOUTH 2 TIMES DAILY.   albuterol 108 (90 Base) MCG/ACT inhaler Commonly known as:  PROVENTIL HFA;VENTOLIN HFA TAKE 2 PUFFS BY MOUTH EVERY 4 HOURS AS NEEDED FOR WHEEZE   amphetamine-dextroamphetamine 20 MG tablet Commonly known as:  ADDERALL Take 20 mg by mouth 2 (two) times daily. 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   calcitRIOL  0.25 MCG capsule Commonly known as:  ROCALTROL TAKE 2 CAPSULES (0.5 MCG TOTAL) BY MOUTH DAILY.   clonazePAM 1 MG tablet Commonly known as:  KLONOPIN Take 1 tablet (1 mg total) by mouth 3 (three) times daily as needed.   Fluocinolone Acetonide 0.01 % Oil Place 5 drops in ear(s) 2 (two) times daily.   levothyroxine 137 MCG tablet Commonly known as:  SYNTHROID, LEVOTHROID Take 1 tablet (137 mcg total) by mouth daily before breakfast.   montelukast 10 MG tablet Commonly known as:  SINGULAIR TAKE 1 TABLET (10 MG TOTAL) BY MOUTH AT BEDTIME. What changed:  See the new instructions.   olmesartan 20 MG  tablet Commonly known as:  BENICAR Take 1 tablet (20 mg total) by mouth daily. What changed:  how much to take   oxyCODONE-acetaminophen 10-325 MG tablet Commonly known as:  PERCOCET Take 1 tablet by mouth every 6 (six) hours as needed for pain.   pantoprazole 40 MG tablet Commonly known as:  Protonix Take 1 tablet (40 mg total) by mouth 2 (two) times daily.   rizatriptan 10 MG tablet Commonly known as:  MAXALT Take 1 tablet (10 mg total) by mouth as needed for migraine. May repeat in 2 hours if needed   sertraline 50 MG tablet Commonly known as:  ZOLOFT Take 150 mg by mouth daily.   triamterene-hydrochlorothiazide 37.5-25 MG capsule Commonly known as:  DYAZIDE Take 1 each (1 capsule total) by mouth daily.   verapamil 180 MG CR tablet Commonly known as:  CALAN-SR Take 2 tablets (360 mg total) by mouth at bedtime. What changed:  when to take this       Today  Patient seen today No new episodes of GI bleed Hemodynamically stable We will discharge home on proton pump inhibitor  VITAL SIGNS:  Blood pressure 130/85, pulse 95, temperature 98.1 F (36.7 C), temperature source Oral, resp. rate 18, height 5\' 5"  (1.651 m), weight 87.5 kg, last menstrual period 06/17/2002, SpO2 100 %.  I/O:    Intake/Output Summary (Last 24 hours) at 04/13/2018 1403 Last data filed at 04/13/2018 1006 Gross per 24 hour  Intake 1220 ml  Output 950 ml  Net 270 ml    PHYSICAL EXAMINATION:  Physical Exam  GENERAL:  58 y.o.-year-old patient lying in the bed with no acute distress.  LUNGS: Normal breath sounds bilaterally, no wheezing, rales,rhonchi or crepitation. No use of accessory muscles of respiration.  CARDIOVASCULAR: S1, S2 normal. No murmurs, rubs, or gallops.  ABDOMEN: Soft, non-tender, non-distended. Bowel sounds present. No organomegaly or mass.  NEUROLOGIC: Moves all 4 extremities. PSYCHIATRIC: The patient is alert and oriented x 3.  SKIN: No obvious rash, lesion, or ulcer.    DATA REVIEW:   CBC Recent Labs  Lab 04/12/18 0450 04/13/18 0911  WBC 5.7  --   HGB 8.5* 9.0*  HCT 26.1* 27.6*  PLT 165  --     Chemistries  Recent Labs  Lab 04/11/18 1723 04/12/18 0450  NA  --  144  K  --  3.8  CL  --  112*  CO2  --  27  GLUCOSE  --  100*  BUN  --  16  CREATININE  --  0.55  CALCIUM  --  7.8*  MG 1.9  --     Cardiac Enzymes Recent Labs  Lab 04/12/18 0450  TROPONINI <0.03    Microbiology Results  Results for orders placed or performed in visit on 05/19/16  Urine culture  Status: None   Collection Time: 05/19/16 11:53 AM  Result Value Ref Range Status   Urine Culture, Routine Final report  Final   Organism ID, Bacteria Comment  Final    Comment: Culture shows less than 10,000 colony forming units of bacteria per milliliter of urine. This colony count is not generally considered to be clinically significant.     RADIOLOGY:  Dg Chest Portable 1 View  Result Date: 04/11/2018 CLINICAL DATA:  Intermittent chest pain beginning yesterday. EXAM: PORTABLE CHEST 1 VIEW COMPARISON:  None. FINDINGS: Lungs clear. Heart size normal. No pneumothorax or pleural fluid. No bony abnormality. IMPRESSION: Negative chest. Electronically Signed   By: Inge Rise M.D.   On: 04/11/2018 15:47    Follow up with PCP in 1 week.  Management plans discussed with the patient, family and they are in agreement.  CODE STATUS: Full code    Code Status Orders  (From admission, onward)         Start     Ordered   04/11/18 1800  Full code  Continuous     04/11/18 1759        Code Status History    This patient has a current code status but no historical code status.      TOTAL TIME TAKING CARE OF THIS PATIENT ON DAY OF DISCHARGE: more than 35 minutes.   Saundra Shelling M.D on 04/13/2018 at 2:03 PM  Between 7am to 6pm - Pager - [phone]  After 6pm go to [email or web address] - password [email or web address] [facility]  Office   [phone]  CC: Primary care physician; Clabe Seal, PA-C  Note: This dictation was prepared with Dragon dictation along with smaller phrase technology. Any transcriptional errors that result from this process are unintentional.

## 2018-04-13 NOTE — Progress Notes (Signed)
Patient discharged to home. IV removed by patient prior to discharge.  Verbalizes understanding of discharge instructions.  Patient states has all belongings

## 2018-04-16 ENCOUNTER — Encounter: Payer: Self-pay | Admitting: Gastroenterology

## 2018-04-23 ENCOUNTER — Telehealth: Payer: Self-pay | Admitting: Gastroenterology

## 2018-04-30 ENCOUNTER — Other Ambulatory Visit: Payer: Self-pay | Admitting: Family Medicine

## 2018-05-06 ENCOUNTER — Telehealth: Payer: Self-pay

## 2018-05-06 NOTE — Telephone Encounter (Signed)
Left a message on the patient's answering machine to contact our office to discuss a webex virtual visit with Dr. Fletcher Anon on May 14, 2018.

## 2018-05-09 ENCOUNTER — Other Ambulatory Visit: Payer: Self-pay | Admitting: Family Medicine

## 2018-05-14 ENCOUNTER — Ambulatory Visit: Payer: Self-pay | Admitting: Cardiovascular Disease

## 2018-06-29 ENCOUNTER — Telehealth: Payer: Self-pay | Admitting: Family Medicine

## 2018-06-29 NOTE — Telephone Encounter (Unsigned)
Copied from [place] [id number]. Topic: Quick Communication - Rx Refill/Question >> Jun 29, 2018 10:50 AM Pauline Good wrote: Medication: calcitRIOL (ROCALTROL) 0.25 MCG capsule  Has the patient contacted their pharmacy? Yes (Agent: If no, request that the patient contact the pharmacy for the refill.) (Agent: If yes, when and what did the pharmacy advise?) pt stated pharmacy said they sent request  Preferred Pharmacy (with phone number or street name): [facility]  Agent: Please be advised that RX refills may take up to 3 business days. We ask that you follow-up with your pharmacy.  Pt is completely out of meds for several days

## 2018-06-29 NOTE — Telephone Encounter (Signed)
Pt has an upcoming TOC appointment with Orland Mustard on 07/12/18

## 2018-07-02 ENCOUNTER — Other Ambulatory Visit: Payer: Self-pay | Admitting: Family Medicine

## 2018-07-02 ENCOUNTER — Other Ambulatory Visit: Payer: Self-pay

## 2018-07-02 DIAGNOSIS — Z5181 Encounter for therapeutic drug level monitoring: Secondary | ICD-10-CM

## 2018-07-02 MED ORDER — CALCITRIOL 0.25 MCG PO CAPS: ORAL_CAPSULE | ORAL | 0 refills | Status: AC

## 2018-07-02 NOTE — Progress Notes (Signed)
Labs are pending Has establishing care appt later this month calcitrol refilled

## 2018-07-02 NOTE — Telephone Encounter (Signed)
This has been handled by management.

## 2018-07-02 NOTE — Telephone Encounter (Signed)
Pt needs medication to last until her appt. The Rocaltrol.

## 2018-07-12 ENCOUNTER — Encounter: Payer: Medicaid Other | Admitting: Registered Nurse

## 2018-07-14 ENCOUNTER — Encounter: Payer: Self-pay | Admitting: Registered Nurse

## 2018-07-26 ENCOUNTER — Other Ambulatory Visit: Payer: Self-pay | Admitting: Family Medicine

## 2018-07-26 NOTE — Telephone Encounter (Signed)
This is not a delegated medication

## 2019-04-25 DIAGNOSIS — E892 Postprocedural hypoparathyroidism: Secondary | ICD-10-CM | POA: Diagnosis not present

## 2019-04-25 DIAGNOSIS — K250 Acute gastric ulcer with hemorrhage: Secondary | ICD-10-CM | POA: Diagnosis not present

## 2019-04-25 DIAGNOSIS — G894 Chronic pain syndrome: Secondary | ICD-10-CM | POA: Diagnosis not present

## 2019-04-25 DIAGNOSIS — E890 Postprocedural hypothyroidism: Secondary | ICD-10-CM | POA: Diagnosis not present

## 2019-04-25 DIAGNOSIS — Z79899 Other long term (current) drug therapy: Secondary | ICD-10-CM | POA: Diagnosis not present

## 2019-04-25 DIAGNOSIS — I10 Essential (primary) hypertension: Secondary | ICD-10-CM | POA: Diagnosis not present

## 2019-04-25 DIAGNOSIS — D500 Iron deficiency anemia secondary to blood loss (chronic): Secondary | ICD-10-CM | POA: Diagnosis not present

## 2019-05-04 DIAGNOSIS — D500 Iron deficiency anemia secondary to blood loss (chronic): Secondary | ICD-10-CM | POA: Diagnosis not present

## 2019-05-04 DIAGNOSIS — E892 Postprocedural hypoparathyroidism: Secondary | ICD-10-CM | POA: Diagnosis not present

## 2019-05-04 DIAGNOSIS — M25551 Pain in right hip: Secondary | ICD-10-CM | POA: Diagnosis not present

## 2019-05-04 DIAGNOSIS — R0602 Shortness of breath: Secondary | ICD-10-CM | POA: Diagnosis not present

## 2019-05-04 DIAGNOSIS — M25552 Pain in left hip: Secondary | ICD-10-CM | POA: Diagnosis not present

## 2019-05-04 DIAGNOSIS — G894 Chronic pain syndrome: Secondary | ICD-10-CM | POA: Diagnosis not present

## 2019-05-04 DIAGNOSIS — K250 Acute gastric ulcer with hemorrhage: Secondary | ICD-10-CM | POA: Diagnosis not present

## 2019-05-04 DIAGNOSIS — I10 Essential (primary) hypertension: Secondary | ICD-10-CM | POA: Diagnosis not present

## 2019-05-09 DIAGNOSIS — D500 Iron deficiency anemia secondary to blood loss (chronic): Secondary | ICD-10-CM | POA: Diagnosis not present

## 2019-05-09 DIAGNOSIS — Z1151 Encounter for screening for human papillomavirus (HPV): Secondary | ICD-10-CM | POA: Diagnosis not present

## 2019-05-09 DIAGNOSIS — I10 Essential (primary) hypertension: Secondary | ICD-10-CM | POA: Diagnosis not present

## 2019-05-09 DIAGNOSIS — G894 Chronic pain syndrome: Secondary | ICD-10-CM | POA: Diagnosis not present

## 2019-05-09 DIAGNOSIS — Z0001 Encounter for general adult medical examination with abnormal findings: Secondary | ICD-10-CM | POA: Diagnosis not present

## 2019-05-09 DIAGNOSIS — Z136 Encounter for screening for cardiovascular disorders: Secondary | ICD-10-CM | POA: Diagnosis not present

## 2019-05-09 DIAGNOSIS — Z79899 Other long term (current) drug therapy: Secondary | ICD-10-CM | POA: Diagnosis not present

## 2019-05-09 DIAGNOSIS — Z01419 Encounter for gynecological examination (general) (routine) without abnormal findings: Secondary | ICD-10-CM | POA: Diagnosis not present

## 2019-05-09 DIAGNOSIS — K250 Acute gastric ulcer with hemorrhage: Secondary | ICD-10-CM | POA: Diagnosis not present

## 2019-05-09 DIAGNOSIS — M2550 Pain in unspecified joint: Secondary | ICD-10-CM | POA: Diagnosis not present

## 2019-05-09 DIAGNOSIS — E892 Postprocedural hypoparathyroidism: Secondary | ICD-10-CM | POA: Diagnosis not present

## 2019-05-09 DIAGNOSIS — M79646 Pain in unspecified finger(s): Secondary | ICD-10-CM | POA: Diagnosis not present

## 2019-07-16 DIAGNOSIS — B9689 Other specified bacterial agents as the cause of diseases classified elsewhere: Secondary | ICD-10-CM | POA: Diagnosis not present

## 2019-07-16 DIAGNOSIS — J019 Acute sinusitis, unspecified: Secondary | ICD-10-CM | POA: Diagnosis not present

## 2019-07-16 DIAGNOSIS — H60501 Unspecified acute noninfective otitis externa, right ear: Secondary | ICD-10-CM | POA: Diagnosis not present

## 2019-08-04 DIAGNOSIS — H608X3 Other otitis externa, bilateral: Secondary | ICD-10-CM | POA: Diagnosis not present

## 2019-08-29 DIAGNOSIS — E079 Disorder of thyroid, unspecified: Secondary | ICD-10-CM | POA: Diagnosis not present

## 2019-08-29 DIAGNOSIS — G894 Chronic pain syndrome: Secondary | ICD-10-CM | POA: Diagnosis not present

## 2019-08-29 DIAGNOSIS — K250 Acute gastric ulcer with hemorrhage: Secondary | ICD-10-CM | POA: Diagnosis not present

## 2019-08-29 DIAGNOSIS — I10 Essential (primary) hypertension: Secondary | ICD-10-CM | POA: Diagnosis not present

## 2019-08-29 DIAGNOSIS — E892 Postprocedural hypoparathyroidism: Secondary | ICD-10-CM | POA: Diagnosis not present

## 2019-08-29 DIAGNOSIS — D500 Iron deficiency anemia secondary to blood loss (chronic): Secondary | ICD-10-CM | POA: Diagnosis not present

## 2019-08-29 DIAGNOSIS — Z79899 Other long term (current) drug therapy: Secondary | ICD-10-CM | POA: Diagnosis not present

## 2019-10-12 DIAGNOSIS — Z01419 Encounter for gynecological examination (general) (routine) without abnormal findings: Secondary | ICD-10-CM | POA: Diagnosis not present

## 2019-10-12 DIAGNOSIS — Z6831 Body mass index (BMI) 31.0-31.9, adult: Secondary | ICD-10-CM | POA: Diagnosis not present

## 2019-10-12 DIAGNOSIS — Z1151 Encounter for screening for human papillomavirus (HPV): Secondary | ICD-10-CM | POA: Diagnosis not present

## 2019-11-29 DIAGNOSIS — Z79890 Hormone replacement therapy (postmenopausal): Secondary | ICD-10-CM | POA: Diagnosis not present

## 2019-11-29 DIAGNOSIS — E892 Postprocedural hypoparathyroidism: Secondary | ICD-10-CM | POA: Diagnosis not present

## 2019-11-29 DIAGNOSIS — Z888 Allergy status to other drugs, medicaments and biological substances status: Secondary | ICD-10-CM | POA: Diagnosis not present

## 2019-11-29 DIAGNOSIS — Z85850 Personal history of malignant neoplasm of thyroid: Secondary | ICD-10-CM | POA: Diagnosis not present

## 2019-11-29 DIAGNOSIS — E890 Postprocedural hypothyroidism: Secondary | ICD-10-CM | POA: Diagnosis not present

## 2019-11-29 DIAGNOSIS — C73 Malignant neoplasm of thyroid gland: Secondary | ICD-10-CM | POA: Diagnosis not present

## 2019-11-29 DIAGNOSIS — Z886 Allergy status to analgesic agent status: Secondary | ICD-10-CM | POA: Diagnosis not present

## 2019-11-29 DIAGNOSIS — Z885 Allergy status to narcotic agent status: Secondary | ICD-10-CM | POA: Diagnosis not present

## 2020-08-18 ENCOUNTER — Other Ambulatory Visit: Payer: Self-pay

## 2020-08-18 ENCOUNTER — Emergency Department
Admission: RE | Admit: 2020-08-18 | Discharge: 2020-08-18 | Disposition: A | Discharge status: Home / Self Care | Payer: Commercial Managed Care - PPO | Source: Ambulatory Visit

## 2020-08-18 VITALS — BP 148/88 | HR 94 | Temp 99.2°F | Ht 65.0 in | Wt 195.0 lb

## 2020-08-18 DIAGNOSIS — J0100 Acute maxillary sinusitis, unspecified: Secondary | ICD-10-CM

## 2020-08-18 DIAGNOSIS — J309 Allergic rhinitis, unspecified: Secondary | ICD-10-CM

## 2020-08-18 DIAGNOSIS — J3489 Other specified disorders of nose and nasal sinuses: Secondary | ICD-10-CM

## 2020-08-18 MED ORDER — METHYLPREDNISOLONE ACETATE 80 MG/ML IJ SUSP
80.0000 mg | Freq: Once | INTRAMUSCULAR | Status: AC
  Administered 2020-08-18: 80 mg via INTRAMUSCULAR

## 2020-08-18 MED ORDER — PREDNISONE 20 MG PO TABS: ORAL_TABLET | ORAL | 0 refills | Status: DC

## 2020-08-18 MED ORDER — AMOXICILLIN-POT CLAVULANATE 875-125 MG PO TABS: 1.0000 | ORAL_TABLET | Freq: Two times a day (BID) | ORAL | 0 refills | Status: AC

## 2020-08-18 MED ORDER — FEXOFENADINE HCL 180 MG PO TABS: 180.0000 mg | ORAL_TABLET | Freq: Every day | ORAL | 0 refills | Status: DC

## 2020-08-18 NOTE — ED Provider Notes (Signed)
[name] CARE    CSN: [id number] Arrival date & time: 08/18/20  1049      History   Chief Complaint Chief Complaint  Patient presents with   Cough    Pt states that she has a cough, chest congestion and sinus pressure. Pt states that she had covid 3 weeks ago. Pt is still having the symptoms.    HPI [name] is a 60 y.o. female.   HPI 60 year old female presents with cough, chest congestion, sinus pressure for 1 to 2 weeks.  Patient reports having COVID-19 3 weeks ago and still having some symptoms. Patient's oral temp is currently 99.2.  Past Medical History:  Diagnosis Date   Allergy    Asthma    Chest pain    a. 12/2010 ETT: No ST/T changes.   Depression    Deviated septum    Diastolic dysfunction    a. 12/2010 Echo: EF 65-70%, no rwma, Gr2 DD, mild MR, mildly dil LA, triv TR.   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   Menopause 06/17/2011   Allergic rhinitis 05/13/2011   Reactive airway disease 05/13/2011   Depressed 05/13/2011   Migraine 05/13/2011   Thyroid cancer ([place]) 05/13/2011   Hypothyroidism, postsurgical 05/13/2011   Hypertension 12/25/2010    Past Surgical History:  Procedure Laterality Date   CESAREAN SECTION     CHOLECYSTECTOMY  03/26/2006   DILATION AND CURETTAGE OF UTERUS  06/30/2000   ESOPHAGOGASTRODUODENOSCOPY (EGD) WITH PROPOFOL N/A 04/12/2018   Procedure: ESOPHAGOGASTRODUODENOSCOPY (EGD) WITH PROPOFOL;  Surgeon: Jonathon Bellows, MD;  Location: [facility];  Service: Gastroenterology;  Laterality: N/A;   THYROIDECTOMY      PARATHYROIDECTOMY    OB History     Gravida  3   Para  1   Term      Preterm  1   AB  2   Living  1      SAB      IAB      Ectopic      Multiple      Live Births  1            Home Medications    Prior to Admission medications   Medication Sig Start Date End Date Taking? Authorizing Provider  acyclovir (ZOVIRAX) 200 MG capsule TAKE 2 CAPSULES BY MOUTH 2 TIMES DAILY. 01/12/18  Yes [name], MD  albuterol (PROVENTIL HFA;VENTOLIN HFA) 108 (90 Base) MCG/ACT inhaler TAKE 2 PUFFS BY MOUTH EVERY 4 HOURS AS NEEDED FOR WHEEZE 12/14/17  Yes [name], MD  amoxicillin-clavulanate (AUGMENTIN) 875-125 MG tablet Take 1 tablet by mouth every 12 (twelve) hours for 5 days. 08/18/20 08/23/20 Yes Eliezer Lofts, FNP  amphetamine-dextroamphetamine (ADDERALL) 20 MG tablet Take 20 mg by mouth 2 (two) times daily. 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 07/11/17  Yes  [name], MD  calcitRIOL (ROCALTROL) 0.25 MCG capsule TAKE 2 CAPSULES (0.5 MCG TOTAL) BY MOUTH DAILY. Please keep upcoming office visit. 07/02/18  Yes Jacelyn Pi, Lilia Argue, MD  clonazePAM (KLONOPIN) 1 MG tablet Take 1 tablet (1 mg total) by mouth 3 (three) times daily as needed. 01/12/18  Yes [name], MD  fexofenadine [facility] ALLERGY) 180 MG tablet Take 1 tablet (180 mg total) by mouth daily for 15 days. 08/18/20 09/02/20 Yes Eliezer Lofts, FNP  Fluocinolone Acetonide 0.01 % OIL Place 5 drops in ear(s) 2 (two) times daily. 01/12/18  Yes [name], MD  levothyroxine (SYNTHROID) 137 MCG tablet TAKE 1 TABLET (137 MCG TOTAL) BY MOUTH DAILY BEFORE BREAKFAST. 05/09/18  Yes Clabe Seal, PA-C  montelukast (SINGULAIR) 10 MG tablet TAKE 1 TABLET (10 MG TOTAL) BY MOUTH AT BEDTIME. Patient  taking differently: Take 10 mg by mouth daily. 12/14/17  Yes [name], MD  olmesartan (BENICAR) 20 MG tablet Take 1 tablet (20 mg total) by mouth daily. Patient taking differently: Take 40 mg by mouth daily. 01/12/18  Yes [name], MD  oxyCODONE-acetaminophen (PERCOCET) 10-325 MG tablet Take 1 tablet by mouth every 6 (six) hours as needed for pain. 01/12/18  Yes [name], MD  predniSONE (DELTASONE) 20 MG tablet Take 3 tabs PO daily x 5 days. 08/18/20  Yes Eliezer Lofts, FNP  rizatriptan (MAXALT) 10 MG tablet Take 1 tablet (10 mg total) by mouth as needed for migraine. May repeat in 2 hours if needed 12/21/17  Yes [name], MD  sertraline (ZOLOFT) 50 MG tablet Take 150 mg by mouth daily.    Yes [provider]  triamterene-hydrochlorothiazide (DYAZIDE) 37.5-25 MG capsule TAKE 1 EACH (1 CAPSULE TOTAL) BY MOUTH DAILY. 04/30/18  Yes Clabe Seal, PA-C  verapamil (CALAN-SR) 180 MG CR tablet Take 2 tablets (360 mg total) by mouth at bedtime. Patient taking differently: Take 360 mg by mouth daily. 07/11/17  Yes [name], MD  pantoprazole (PROTONIX) 40 MG tablet Take 1 tablet (40 mg total) by mouth 2 (two) times daily. 04/13/18 04/13/19  Saundra Shelling, MD    Family History Family History  Problem Relation Age of Onset   Heart disease Father        CABG in his 65s   Hyperlipidemia Father    Heart disease Brother        CABG   Heart disease Sister        Stents in 38s   Hyperlipidemia Sister    Heart disease Sister        CABG in 76s   Hyperlipidemia Sister    Hyperlipidemia Mother    Breast cancer Maternal Aunt    Hyperlipidemia Maternal Uncle    Hyperlipidemia Maternal Grandmother    Breast cancer Maternal Grandmother    Hyperlipidemia Paternal Grandmother    Breast cancer Paternal Grandmother     Social History Social History   Tobacco Use   Smoking status: Former    Packs/day: 2.00    Years: 20.00    Pack years: 40.00    Types: Cigarettes    Quit date:  05/13/1990    Years since quitting: 30.2   Smokeless tobacco: Never  Vaping Use   Vaping Use: Never used  Substance Use Topics   Alcohol use: No    Comment: Rarely   Drug use: No     Allergies   Ibuprofen and Lisinopril   Review of Systems Review of  Systems  HENT:  Positive for congestion, postnasal drip and sinus pressure.   Respiratory:  Positive for cough.   All other systems reviewed and are negative.   Physical Exam Triage Vital Signs ED Triage Vitals  Enc Vitals Group     BP 08/18/20 1116 (!) 148/88     Pulse Rate 08/18/20 1116 94     Resp --      Temp 08/18/20 1116 99.2 F (37.3 C)     Temp Source 08/18/20 1116 Oral     SpO2 08/18/20 1116 97 %     Weight 08/18/20 1113 195 lb (88.5 kg)     Height 08/18/20 1113 '5\' 5"'$  (1.651 m)     Head Circumference --      Peak Flow --      Pain Score 08/18/20 1113 0     Pain Loc --      Pain Edu? --      Excl. in [place]? --    No data found.  Updated Vital Signs BP (!) 148/88 (BP Location: Right Arm)   Pulse 94   Temp 99.2 F (37.3 C) (Oral)   Ht '5\' 5"'$  (1.651 m)   Wt 195 lb (88.5 kg)   LMP 06/17/2002   SpO2 97%   BMI 32.45 kg/m      Physical Exam Vitals and nursing note reviewed.  Constitutional:      General: She is not in acute distress.    Appearance: Normal appearance. She is obese. She is not ill-appearing.  HENT:     Head: Normocephalic and atraumatic.     Right Ear: Tympanic membrane, ear canal and external ear normal.     Left Ear: Tympanic membrane, ear canal and external ear normal.     Nose:     Right Sinus: Maxillary sinus tenderness present.     Left Sinus: Maxillary sinus tenderness present.     Comments: Turbinates are erythematous    Mouth/Throat:     Mouth: Mucous membranes are moist.     Pharynx: Oropharynx is clear.     Comments: Moderate amount of clear drainage of posterior oropharynx noted Cardiovascular:     Rate and Rhythm: Normal rate and regular rhythm.     Pulses: Normal  pulses.     Heart sounds: Normal heart sounds.  Pulmonary:     Effort: Pulmonary effort is normal.     Breath sounds: No wheezing, rhonchi or rales.     Comments: No adventitious breath sounds noted Musculoskeletal:        General: Normal range of motion.     Cervical back: Normal range of motion and neck supple. No tenderness.  Lymphadenopathy:     Cervical: No cervical adenopathy.  Skin:    General: Skin is warm and dry.  Neurological:     General: No focal deficit present.     Mental Status: She is alert and oriented to person, place, and time.  Psychiatric:        Mood and Affect: Mood normal.        Behavior: Behavior normal.     UC Treatments / Results  Labs (all labs ordered are listed, but only abnormal results are displayed) Labs Reviewed - No data to display  EKG   Radiology No results found.  Procedures Procedures (including critical care time)  Medications Ordered in UC Medications  methylPREDNISolone acetate (DEPO-MEDROL) injection 80 mg (80 mg Intramuscular Given 08/18/20 1233)    Initial Impression / Assessment and  Plan / UC Course  I have reviewed the triage vital signs and the nursing notes.  Pertinent labs & imaging results that were available during my care of the patient were reviewed by me and considered in my medical decision making (see chart for details).     MDM: 1.  Acute maxillary sinusitis-Rx'd Augmentin 875 mg twice daily x 5 days, 2.  Sinus pressure-IM Depo-Medrol 80 mg given once in clinic today prior to discharge, prednisone burst 60 mg daily x 5 days advised patient to start burst tomorrow, Sunday, 08/19/2020. 3.  Allergic rhinitis-Rx'd Allegra 180 mg daily x 5 days, then as needed patient discharged home, hemodynamically stable Final Clinical Impressions(s) / UC Diagnoses   Final diagnoses:  Acute maxillary sinusitis, recurrence not specified  Sinus pressure  Allergic rhinitis, unspecified seasonality, unspecified trigger      Discharge Instructions      Advised/instructed patient to take medication as directed with food to completion.  Advised patient may take Allegra 180 mg daily for the next 5 days, then as needed.  Advised/instructed patient not to start oral prednisone burst until tomorrow morning, Sunday, 08/16/2020.  Advised/encouraged patient increase daily water intake while taking this medication.     ED Prescriptions     Medication Sig Dispense Auth. Provider   amoxicillin-clavulanate (AUGMENTIN) 875-125 MG tablet Take 1 tablet by mouth every 12 (twelve) hours for 5 days. 10 tablet Eliezer Lofts, FNP   predniSONE (DELTASONE) 20 MG tablet Take 3 tabs PO daily x 5 days. 15 tablet Eliezer Lofts, FNP   fexofenadine [facility] ALLERGY) 180 MG tablet Take 1 tablet (180 mg total) by mouth daily for 15 days. 15 tablet Eliezer Lofts, FNP      PDMP not reviewed this encounter.   Eliezer Lofts, [place] 08/18/20 [place]

## 2020-08-18 NOTE — ED Triage Notes (Signed)
Pt states that she hasn't been running a fever but she has some sob, cough, chest congestion and sinus pressure. Pt states that she had covid 3 weeks ago and the symptoms haven't resolved.

## 2020-08-18 NOTE — Discharge Instructions (Addendum)
Advised/instructed patient to take medication as directed with food to completion.  Advised patient may take Allegra 180 mg daily for the next 5 days, then as needed.  Advised/instructed patient not to start oral prednisone burst until tomorrow morning, Sunday, 08/16/2020.  Advised/encouraged patient increase daily water intake while taking this medication.

## 2020-08-25 ENCOUNTER — Telehealth: Payer: Self-pay | Admitting: Emergency Medicine

## 2020-08-25 ENCOUNTER — Telehealth: Payer: Self-pay | Admitting: Family Medicine

## 2020-08-25 MED ORDER — AMOXICILLIN-POT CLAVULANATE 875-125 MG PO TABS: 1.0000 | ORAL_TABLET | Freq: Two times a day (BID) | ORAL | 0 refills | Status: DC

## 2020-08-25 NOTE — Telephone Encounter (Signed)
Patient has ongoing symptoms of sinus pressure and pain.  She is requesting a refill of her medication.  I reviewed the chart.  She has been having symptoms for 3 weeks.  I believe additional antibiotic may be indicated to clear this up.  Sometimes patients do need more than 5 days of Augmentin.  She is tolerating the medicines well.  She is advised to push fluids, use Flonase, use nasal saline, and take the additional Augmentin to clear up the infection. Patient was called by the nurse and informed of above

## 2020-08-25 NOTE — Telephone Encounter (Signed)
Call back to [facility] - additional 5 days of augmentin e-scribed by Dr Meda Coffee (confirmed by RN) pt to start today. Pt to continue w/ saline sprays & flonase. Follow up w/ PCP if no improvement - pt thanked [place] for our help.

## 2020-08-25 NOTE — Telephone Encounter (Signed)
Call back to [name] - pt states she completed the 5 days of augmentin & prednisone (pt was seen on 08/18/20) for her sinus infection. She is continuing to use the flonase spray &  saline spray/rinse daily. Pt stopped allegra after 5 days. Joie states her face is still very tender- denies fever & nasal drainage remains thick and green. Jeronda asked if she needs additional antibiotics or if she needs to be seen again. RN will follow up with Dr Meda Coffee and call pt back. Pharmacy confirmed w/ patient

## 2021-08-12 ENCOUNTER — Emergency Department
Admission: EM | Admit: 2021-08-12 | Discharge: 2021-08-12 | Disposition: A | Discharge status: Home / Self Care | Payer: Managed Care, Other (non HMO) | Attending: Family Medicine | Admitting: Family Medicine

## 2021-08-12 DIAGNOSIS — J3489 Other specified disorders of nose and nasal sinuses: Secondary | ICD-10-CM

## 2021-08-12 DIAGNOSIS — J309 Allergic rhinitis, unspecified: Secondary | ICD-10-CM

## 2021-08-12 DIAGNOSIS — J0100 Acute maxillary sinusitis, unspecified: Secondary | ICD-10-CM

## 2021-08-12 DIAGNOSIS — H6591 Unspecified nonsuppurative otitis media, right ear: Secondary | ICD-10-CM | POA: Diagnosis not present

## 2021-08-12 MED ORDER — FEXOFENADINE HCL 180 MG PO TABS: 180.0000 mg | ORAL_TABLET | Freq: Every day | ORAL | 0 refills | Status: DC

## 2021-08-12 MED ORDER — PREDNISONE 20 MG PO TABS: ORAL_TABLET | ORAL | 0 refills | Status: DC

## 2021-08-12 MED ORDER — AMOXICILLIN-POT CLAVULANATE 875-125 MG PO TABS: 1.0000 | ORAL_TABLET | Freq: Two times a day (BID) | ORAL | 0 refills | Status: AC

## 2021-08-12 NOTE — Discharge Instructions (Addendum)
Advised patient to take medication as directed with food to completion.  Instructed patient to take prednisone and Allegra with first dose of Augmentin for the next 5 of 10 days.  Advised may use Allegra as needed afterwards for concurrent postnasal drainage/drip.  Encouraged patient to increase daily water intake while taking these medications.  Advised patient if symptoms worsen and/or unresolved please follow-up with PCP or here for further evaluation.

## 2021-08-12 NOTE — ED Provider Notes (Signed)
Vinnie [facility]ton CARE    CSN: [id number] Arrival date & time: 08/12/21  1733      History   Chief Complaint Chief Complaint  Patient presents with   Otalgia    HPI [name] is a 61 y.o. female.   HPI Pleasant 61 year old female presents with right ear pain and sinus nasal congestion, and sinus pressure for 3-4 days.  PMH significant for obesity, thyroid cancer, HTN, and reactive airway disease.  Past Medical History:  Diagnosis Date   Allergy    Asthma    Chest pain    a. 12/2010 ETT: No ST/T changes.   Depression    Deviated septum    Diastolic dysfunction    a. 12/2010 Echo: EF 65-70%, no rwma, Gr2 DD, mild MR, mildly dil LA, triv TR.   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   Menopause 06/17/2011   Allergic rhinitis 05/13/2011   Reactive airway disease 05/13/2011   Depressed 05/13/2011   Migraine 05/13/2011   Thyroid cancer ([place]) 05/13/2011   Hypothyroidism, postsurgical 05/13/2011   Hypertension 12/25/2010    Past Surgical History:  Procedure Laterality Date   CESAREAN SECTION     CHOLECYSTECTOMY  03/26/2006   DILATION AND CURETTAGE OF UTERUS  06/30/2000   ESOPHAGOGASTRODUODENOSCOPY (EGD) WITH PROPOFOL N/A 04/12/2018   Procedure: ESOPHAGOGASTRODUODENOSCOPY (EGD) WITH PROPOFOL;  Surgeon: Jonathon Bellows, MD;  Location: [facility];  Service: Gastroenterology;  Laterality: N/A;   THYROIDECTOMY     PARATHYROIDECTOMY    OB History     Gravida  3   Para  1   Term      Preterm  1   AB  2   Living  1      SAB      IAB      Ectopic      Multiple       Live Births  1            Home Medications    Prior to Admission medications   Medication Sig Start Date End Date Taking? Authorizing Provider  amoxicillin-clavulanate (AUGMENTIN) 875-125 MG tablet Take 1 tablet by mouth 2 (two) times daily for 10 days. 08/12/21 08/22/21 Yes Eliezer Lofts, FNP  fexofenadine [facility] ALLERGY) 180 MG tablet Take 1 tablet (180 mg total) by mouth daily for 15 days. 08/12/21 08/27/21 Yes Eliezer Lofts, FNP  predniSONE (DELTASONE) 20 MG tablet Take 3 tabs PO daily x 5 days. 08/12/21  Yes Eliezer Lofts, FNP  albuterol (PROVENTIL HFA;VENTOLIN HFA) 108 (90 Base) MCG/ACT inhaler TAKE 2 PUFFS BY MOUTH EVERY 4 HOURS AS NEEDED FOR WHEEZE 12/14/17   [name], MD  amphetamine-dextroamphetamine (ADDERALL) 20 MG tablet Take 20 mg by mouth 2 (two) times daily. 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 Please keep upcoming office visit. 07/02/18   Jacelyn Pi, Lilia Argue, MD  clonazePAM (KLONOPIN) 1 MG tablet Take 1 tablet (1 mg total) by mouth 3 (three) times daily as needed.  01/12/18   [name], MD  Fluocinolone Acetonide 0.01 % OIL Place 5 drops in ear(s) 2 (two) times daily. Patient not taking: Reported on 08/12/2021 01/12/18   [name], MD  levothyroxine (SYNTHROID) 137 MCG tablet TAKE 1 TABLET (137 MCG TOTAL) BY MOUTH DAILY BEFORE BREAKFAST. 05/09/18   Clabe Seal, PA-C  montelukast (SINGULAIR) 10 MG tablet TAKE 1 TABLET (10 MG TOTAL) BY MOUTH AT BEDTIME. Patient taking differently: Take 10 mg by mouth daily. 12/14/17   [name], MD  olmesartan (BENICAR) 20 MG tablet Take 1 tablet (20 mg total) by mouth daily. Patient taking differently: Take 40 mg by mouth daily. 01/12/18   [name], MD  oxyCODONE-acetaminophen (PERCOCET) 10-325 MG tablet Take 1 tablet by mouth every 6 (six) hours as needed for pain. 01/12/18   [name], MD  pantoprazole  (PROTONIX) 40 MG tablet Take 1 tablet (40 mg total) by mouth 2 (two) times daily. Patient not taking: Reported on 08/12/2021 04/13/18 04/13/19  Saundra Shelling, MD  rizatriptan (MAXALT) 10 MG tablet Take 1 tablet (10 mg total) by mouth as needed for migraine. May repeat in 2 hours if needed 12/21/17   [name], MD  sertraline (ZOLOFT) 50 MG tablet Take 150 mg by mouth daily.     [provider]  triamterene-hydrochlorothiazide (DYAZIDE) 37.5-25 MG capsule TAKE 1 EACH (1 CAPSULE TOTAL) BY MOUTH DAILY. 04/30/18   Clabe Seal, PA-C  verapamil (CALAN-SR) 180 MG CR tablet Take 2 tablets (360 mg total) by mouth at bedtime. Patient taking differently: Take 360 mg by mouth daily. 07/11/17   [name], MD    Family History Family History  Problem Relation Age of Onset   Heart disease Father        CABG in his 68s   Hyperlipidemia Father    Heart disease Brother        CABG   Heart disease Sister        Stents in 80s   Hyperlipidemia Sister    Heart disease Sister        CABG in 84s   Hyperlipidemia Sister    Hyperlipidemia Mother    Breast cancer Maternal Aunt    Hyperlipidemia Maternal Uncle    Hyperlipidemia Maternal Grandmother    Breast cancer Maternal Grandmother    Hyperlipidemia Paternal Grandmother    Breast cancer Paternal Grandmother     Social History Social History   Tobacco Use   Smoking status: Former    Packs/day: 2.00    Years: 20.00    Total pack years: 40.00    Types: Cigarettes    Quit date: 05/13/1990    Years since quitting: 31.2   Smokeless tobacco: Never  Vaping Use   Vaping Use: Never used  Substance Use Topics   Alcohol use: No    Comment: Rarely   Drug use: No     Allergies   Ibuprofen and Lisinopril   Review of Systems Review of Systems  HENT:  Positive for congestion, ear pain, sinus pressure and sinus pain.   All other systems reviewed and are negative.    Physical Exam Triage Vital Signs ED Triage Vitals  Enc Vitals Group      BP 08/12/21 1744 (!) 179/116     Pulse Rate 08/12/21 1744 (!) 101     Resp 08/12/21 1744 16     Temp 08/12/21 1744 98.6 F (37 C)     Temp Source 08/12/21 1744  Oral     SpO2 08/12/21 1744 96 %     Weight --      Height --      Head Circumference --      Peak Flow --      Pain Score 08/12/21 1746 4     Pain Loc --      Pain Edu? --      Excl. in [place]? --    No data found.  Updated Vital Signs BP (!) 179/116 (BP Location: Right Arm)   Pulse (!) 101   Temp 98.6 F (37 C) (Oral)   Resp 16   LMP 06/17/2002   SpO2 96%    Physical Exam Vitals and nursing note reviewed.  Constitutional:      Appearance: Normal appearance. She is obese.  HENT:     Head: Normocephalic and atraumatic.     Right Ear: External ear normal.     Left Ear: Tympanic membrane and external ear normal.     Ears:     Comments: Moderate eustachian tube dysfunction noted bilaterally; Right TM: Erythematous, bulging with serous effusions noted    Nose:     Right Sinus: Maxillary sinus tenderness present.     Left Sinus: Maxillary sinus tenderness present.     Comments: Turbinates are erythematous/edematous    Mouth/Throat:     Mouth: Mucous membranes are moist.     Pharynx: Oropharynx is clear.     Comments: Significant amount of clear drainage of posterior oropharynx noted Eyes:     Extraocular Movements: Extraocular movements intact.     Conjunctiva/sclera: Conjunctivae normal.     Pupils: Pupils are equal, round, and reactive to light.  Cardiovascular:     Rate and Rhythm: Normal rate and regular rhythm.     Pulses: Normal pulses.     Heart sounds: Normal heart sounds. No murmur heard. Pulmonary:     Effort: Pulmonary effort is normal.     Breath sounds: Normal breath sounds. No wheezing, rhonchi or rales.  Musculoskeletal:        General: Normal range of motion.     Cervical back: Normal range of motion and neck supple.  Skin:    General: Skin is warm and dry.  Neurological:     General:  No focal deficit present.     Mental Status: She is alert and oriented to person, place, and time.      UC Treatments / Results  Labs (all labs ordered are listed, but only abnormal results are displayed) Labs Reviewed - No data to display  EKG   Radiology No results found.  Procedures Procedures (including critical care time)  Medications Ordered in UC Medications - No data to display  Initial Impression / Assessment and Plan / UC Course  I have reviewed the triage vital signs and the nursing notes.  Pertinent labs & imaging results that were available during my care of the patient were reviewed by me and considered in my medical decision making (see chart for details).     MDM: 1.  Right otitis media with effusion-Rx'd Augmentin; 2.  Subacute maxillary sinusitis-Rx'd Augmentin; 3.  Sinus pressure-Rx'd prednisone; 4.  Allergic rhinitis-Rx'd Allegra. Advised patient to take medication as directed with food to completion.  Instructed patient to take prednisone and Allegra with first dose of Augmentin for the next 5 of 10 days.  Advised may use Allegra as needed afterwards for concurrent postnasal drainage/drip.  Encouraged patient to increase daily water intake  while taking these medications.  Advised patient if symptoms worsen and/or unresolved please follow-up with PCP or here for further evaluation.  Final Clinical Impressions(s) / UC Diagnoses   Final diagnoses:  Right otitis media with effusion  Subacute maxillary sinusitis  Sinus pressure  Allergic rhinitis, unspecified seasonality, unspecified trigger     Discharge Instructions      Advised patient to take medication as directed with food to completion.  Instructed patient to take prednisone and Allegra with first dose of Augmentin for the next 5 of 10 days.  Advised may use Allegra as needed afterwards for concurrent postnasal drainage/drip.  Encouraged patient to increase daily water intake while taking these  medications.  Advised patient if symptoms worsen and/or unresolved please follow-up with PCP or here for further evaluation.     ED Prescriptions     Medication Sig Dispense Auth. Provider   amoxicillin-clavulanate (AUGMENTIN) 875-125 MG tablet Take 1 tablet by mouth 2 (two) times daily for 10 days. 20 tablet Eliezer Lofts, FNP   predniSONE (DELTASONE) 20 MG tablet Take 3 tabs PO daily x 5 days. 15 tablet Eliezer Lofts, FNP   fexofenadine [facility] ALLERGY) 180 MG tablet Take 1 tablet (180 mg total) by mouth daily for 15 days. 15 tablet Eliezer Lofts, FNP      PDMP not reviewed this encounter.   Eliezer Lofts, [place] 08/12/21 1829

## 2021-08-12 NOTE — ED Triage Notes (Signed)
Pt presents with c/o rt ear pain and bilateral facial pain under her eyes "for several days"

## 2022-07-16 ENCOUNTER — Other Ambulatory Visit (HOSPITAL_COMMUNITY): Payer: Self-pay

## 2022-09-13 ENCOUNTER — Ambulatory Visit
Admission: EM | Admit: 2022-09-13 | Discharge: 2022-09-13 | Disposition: A | Discharge status: Home / Self Care | Payer: 59

## 2022-09-13 ENCOUNTER — Other Ambulatory Visit: Payer: Self-pay

## 2022-09-13 DIAGNOSIS — H5711 Ocular pain, right eye: Secondary | ICD-10-CM | POA: Diagnosis not present

## 2022-09-13 DIAGNOSIS — H109 Unspecified conjunctivitis: Secondary | ICD-10-CM

## 2022-09-13 NOTE — ED Triage Notes (Signed)
Pt c/o RT eye redness since waking up this am. Painful and worsening throughout the day. Sensitive to light. Recently sick with a cold.

## 2022-09-13 NOTE — ED Provider Notes (Signed)
[name]    CSN: [id number] Arrival date & time: 09/13/22  1535      History   Chief Complaint Chief Complaint  Patient presents with   Eye Problem    RT    HPI [name] is a 62 y.o. female.   HPI 62 year old female presents with right eye irritation and redness since earlier this morning.Marland Kitchen  PMH significant for obesity, diastolic dysfunction, and HTN.  Patient reports not taking her blood pressure medication for 3 days if she has been sick.  Past Medical History:  Diagnosis Date   Allergy    Asthma    Chest pain    a. 12/2010 ETT: No ST/T changes.   Depression    Deviated septum    Diastolic dysfunction    a. 12/2010 Echo: EF 65-70%, no rwma, Gr2 DD, mild MR, mildly dil LA, triv TR.   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   Menopause 06/17/2011   Allergic rhinitis 05/13/2011   Reactive airway disease 05/13/2011   Depressed 05/13/2011   Migraine 05/13/2011   Thyroid cancer (HCC) 05/13/2011   Hypothyroidism, postsurgical 05/13/2011   Hypertension 12/25/2010    Past Surgical History:  Procedure Laterality Date   CESAREAN SECTION     CHOLECYSTECTOMY  03/26/2006   DILATION AND CURETTAGE OF UTERUS  06/30/2000   ESOPHAGOGASTRODUODENOSCOPY (EGD) WITH PROPOFOL N/A 04/12/2018   Procedure: ESOPHAGOGASTRODUODENOSCOPY (EGD) WITH PROPOFOL;  Surgeon: Wyline Mood, MD;  Location: [facility] ENDOSCOPY;  Service: Gastroenterology;  Laterality: N/A;   THYROIDECTOMY     PARATHYROIDECTOMY    OB History     Gravida  3   Para  1   Term      Preterm  1   AB  2    Living  1      SAB      IAB      Ectopic      Multiple      Live Births  1            Home Medications    Prior to Admission medications   Medication Sig Start Date End Date Taking? Authorizing Provider  albuterol (PROVENTIL HFA;VENTOLIN HFA) 108 (90 Base) MCG/ACT inhaler TAKE 2 PUFFS BY MOUTH EVERY 4 HOURS AS NEEDED FOR WHEEZE 12/14/17   Sherren Mocha, MD  amphetamine-dextroamphetamine (ADDERALL) 20 MG tablet Take 20 mg by mouth 2 (two) times daily. Takes only when working and at [employer]), Historical, MD  calcitRIOL (ROCALTROL) 0.25 MCG capsule TAKE 2 CAPSULES (0.5 MCG TOTAL) BY MOUTH DAILY. Please keep upcoming office visit. 07/02/18   Lezlie Lye, Meda Coffee, MD  clonazePAM (KLONOPIN) 1 MG tablet Take 1 tablet (1 mg total) by mouth 3 (three) times daily as needed. 01/12/18   Sherren Mocha, MD  fexofenadine [facility] ALLERGY) 180 MG tablet Take 1 tablet (180 mg total) by mouth daily for 15 days. 08/12/21 08/27/21  Trevor Iha, FNP  Fluocinolone Acetonide 0.01 % OIL Place 5 drops in ear(s) 2 (two) times daily. Patient not taking: Reported on 08/12/2021 01/12/18  Sherren Mocha, MD  levothyroxine (SYNTHROID) 137 MCG tablet TAKE 1 TABLET (137 MCG TOTAL) BY MOUTH DAILY BEFORE BREAKFAST. 05/09/18   Leanora Ivanoff, PA-C  montelukast (SINGULAIR) 10 MG tablet TAKE 1 TABLET (10 MG TOTAL) BY MOUTH AT BEDTIME. Patient taking differently: Take 10 mg by mouth daily. 12/14/17   Sherren Mocha, MD  olmesartan (BENICAR) 20 MG tablet Take 1 tablet (20 mg total) by mouth daily. Patient taking differently: Take 40 mg by mouth daily. 01/12/18   Sherren Mocha, MD  oxyCODONE-acetaminophen (PERCOCET) 10-325 MG tablet Take 1 tablet by mouth every 6 (six) hours as needed for pain. 01/12/18   Sherren Mocha, MD  pantoprazole (PROTONIX) 40 MG tablet Take 1 tablet (40 mg total) by mouth 2 (two) times daily. Patient not taking: Reported on 08/12/2021 04/13/18 04/13/19  Ihor Austin, MD  predniSONE (DELTASONE) 20 MG  tablet Take 3 tabs PO daily x 5 days. 08/12/21   Trevor Iha, FNP  rizatriptan (MAXALT) 10 MG tablet Take 1 tablet (10 mg total) by mouth as needed for migraine. May repeat in 2 hours if needed 12/21/17   Sherren Mocha, MD  sertraline (ZOLOFT) 50 MG tablet Take 150 mg by mouth daily.     [provider]  triamterene-hydrochlorothiazide (DYAZIDE) 37.5-25 MG capsule TAKE 1 EACH (1 CAPSULE TOTAL) BY MOUTH DAILY. 04/30/18   Leanora Ivanoff, PA-C  verapamil (CALAN-SR) 180 MG CR tablet Take 2 tablets (360 mg total) by mouth at bedtime. Patient taking differently: Take 360 mg by mouth daily. 07/11/17   Sherren Mocha, MD    Family History Family History  Problem Relation Age of Onset   Heart disease Father        CABG in his 34s   Hyperlipidemia Father    Heart disease Brother        CABG   Heart disease Sister        Stents in 30s   Hyperlipidemia Sister    Heart disease Sister        CABG in 2s   Hyperlipidemia Sister    Hyperlipidemia Mother    Breast cancer Maternal Aunt    Hyperlipidemia Maternal Uncle    Hyperlipidemia Maternal Grandmother    Breast cancer Maternal Grandmother    Hyperlipidemia Paternal Grandmother    Breast cancer Paternal Grandmother     Social History Social History   Tobacco Use   Smoking status: Former    Current packs/day: 0.00    Average packs/day: 2.0 packs/day for 20.0 years (40.0 ttl pk-yrs)    Types: Cigarettes    Start date: 05/13/1970    Quit date: 05/13/1990    Years since quitting: 32.3   Smokeless tobacco: Never  Vaping Use   Vaping status: Never Used  Substance Use Topics   Alcohol use: No    Comment: Rarely   Drug use: No     Allergies   Ibuprofen and Lisinopril   Review of Systems Review of Systems  Eyes:  Positive for photophobia and redness.  All other systems reviewed and are negative.    Physical Exam Triage Vital Signs ED Triage Vitals  Encounter Vitals Group     BP 09/13/22 1546 (!) 188/92     Systolic BP  Percentile --      Diastolic BP Percentile --      Pulse Rate 09/13/22 1546 (!) 109     Resp --      Temp 09/13/22 1546 98.6 F (37 C)  Temp Source 09/13/22 1546 Oral     SpO2 09/13/22 1546 97 %     Weight --      Height --      Head Circumference --      Peak Flow --      Pain Score 09/13/22 1545 6     Pain Loc --      Pain Education --      Exclude from Growth Chart --    No data found.  Updated Vital Signs BP (!) 188/92 (BP Location: Right Arm)   Pulse (!) 109   Temp 98.6 F (37 C) (Oral)   LMP 06/17/2002   SpO2 97%      Physical Exam Vitals and nursing note reviewed.  Constitutional:      Appearance: Normal appearance. She is normal weight.  HENT:     Head: Normocephalic and atraumatic.     Mouth/Throat:     Mouth: Mucous membranes are moist.     Pharynx: Oropharynx is clear.  Eyes:     Extraocular Movements: Extraocular movements intact.     Conjunctiva/sclera: Conjunctivae normal.     Pupils: Pupils are equal, round, and reactive to light.     Comments: Right eye sclera with +3 injection  Cardiovascular:     Rate and Rhythm: Normal rate and regular rhythm.     Pulses: Normal pulses.     Heart sounds: Normal heart sounds.  Pulmonary:     Effort: Pulmonary effort is normal.     Breath sounds: Normal breath sounds. No wheezing, rhonchi or rales.  Musculoskeletal:        General: Normal range of motion.     Cervical back: Normal range of motion and neck supple.  Skin:    General: Skin is warm and dry.  Neurological:     General: No focal deficit present.     Mental Status: She is alert and oriented to person, place, and time. Mental status is at baseline.  Psychiatric:        Mood and Affect: Mood normal.        Behavior: Behavior normal.        Thought Content: Thought content normal.      UC Treatments / Results  Labs (all labs ordered are listed, but only abnormal results are displayed) Labs Reviewed - No data to  display  EKG   Radiology No results found.  Procedures Procedures (including critical care time)  Medications Ordered in UC Medications - No data to display  Initial Impression / Assessment and Plan / UC Course  I have reviewed the triage vital signs and the nursing notes.  Pertinent labs & imaging results that were available during my care of the patient were reviewed by me and considered in my medical decision making (see chart for details).     MDM: 1.  Conjunctivitis of right eye, unspecified conjunctivitis type-instilled 2 drops of sulfacetamide sodium ophthalmic solution 10% into patient's right eye in clinic prior to discharge.  Patient provided one 15 mL bottle of sulfacetamide sodium ophthalmic solution 10% with specific instructions on how to instill medication into right eye.  2.  Pain in right eye-sulfacetamide sodium ophthalmic solution 10% provided to patient here in clinic prior to discharge Advised patient to place 2 drops of sulfacetamide ophthalmic solution into right eye 3 times daily for the next 7 days.  Advised patient if symptoms worsen and/or unresolved please follow-up with local optometry/ophthalmologist or here for further evaluation. Final  Clinical Impressions(s) / UC Diagnoses   Final diagnoses:  Conjunctivitis of right eye, unspecified conjunctivitis type  Pain in right eye     Discharge Instructions      Advised patient to place 2 drops of sulfacetamide ophthalmic solution into right eye 3 times daily for the next 7 days.  Advised patient if symptoms worsen and/or unresolved please follow-up with local optometry/ophthalmologist or here for further evaluation.     ED Prescriptions   None    PDMP not reviewed this encounter.   Trevor Iha, FNP 09/13/22 1601

## 2022-09-13 NOTE — Discharge Instructions (Addendum)
Advised patient to place 2 drops of sulfacetamide ophthalmic solution into right eye 3 times daily for the next 7 days.  Advised patient if symptoms worsen and/or unresolved please follow-up with local optometry/ophthalmologist or here for further evaluation.

## 2022-10-01 ENCOUNTER — Ambulatory Visit: Payer: 59 | Attending: Cardiovascular Disease | Admitting: Cardiovascular Disease

## 2022-12-23 ENCOUNTER — Telehealth: Payer: 59 | Admitting: Nurse Practitioner

## 2022-12-23 DIAGNOSIS — R509 Fever, unspecified: Secondary | ICD-10-CM

## 2022-12-23 DIAGNOSIS — J029 Acute pharyngitis, unspecified: Secondary | ICD-10-CM | POA: Diagnosis not present

## 2022-12-23 DIAGNOSIS — J020 Streptococcal pharyngitis: Secondary | ICD-10-CM

## 2022-12-23 MED ORDER — PREDNISONE 20 MG PO TABS: 20.0000 mg | ORAL_TABLET | Freq: Every day | ORAL | 0 refills | Status: AC

## 2022-12-23 MED ORDER — PENICILLIN V POTASSIUM 500 MG PO TABS: 500.0000 mg | ORAL_TABLET | Freq: Two times a day (BID) | ORAL | 0 refills | Status: DC

## 2022-12-23 NOTE — Progress Notes (Signed)
Virtual Visit Consent   [name], you are scheduled for a virtual visit with a [facility] provider today. Just as with appointments in the office, your consent must be obtained to participate. Your consent will be active for this visit and any virtual visit you may have with one of our providers in the next 365 days. If you have a MyChart account, a copy of this consent can be sent to you electronically.  As this is a virtual visit, video technology does not allow for your provider to perform a traditional examination. This may limit your provider's ability to fully assess your condition. If your provider identifies any concerns that need to be evaluated in person or the need to arrange testing (such as labs, EKG, etc.), we will make arrangements to do so. Although advances in technology are sophisticated, we cannot ensure that it will always work on either your end or our end. If the connection with a video visit is poor, the visit may have to be switched to a telephone visit. With either a video or telephone visit, we are not always able to ensure that we have a secure connection.  By engaging in this virtual visit, you consent to the provision of healthcare and authorize for your insurance to be billed (if applicable) for the services provided during this visit. Depending on your insurance coverage, you may receive a charge related to this service.  I need to obtain your verbal consent now. Are you willing to proceed with your visit today? [name] has provided verbal consent on 12/23/2022 for a virtual visit (video or telephone). Viviano Simas, FNP  Date: 12/23/2022 8:36 AM  Virtual Visit via Video Note   I, Viviano Simas, connected with  [name]  ([id number], 05-22-1958) on 12/23/22 at  8:45 AM EST by a video-enabled telemedicine application and verified that I am speaking with the correct person using two identifiers.  Location: Patient: Virtual Visit Location  Patient: Home Provider: Virtual Visit Location Provider: Home Office   I discussed the limitations of evaluation and management by telemedicine and the availability of in person appointments. The patient expressed understanding and agreed to proceed.    History of Present Illness: [name] is a 62 y.o. who identifies as a female who was assigned female at birth, and is being seen today for sore throat.  Her symptoms started with fever yesterday  This morning her throat was so sore and swollen she felt is painful to swallow.  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   Menopause 06/17/2011   Allergic rhinitis 05/13/2011   Reactive airway disease 05/13/2011   Depressed 05/13/2011   Migraine 05/13/2011   Thyroid cancer (HCC) 05/13/2011   Hypothyroidism, postsurgical 05/13/2011   Hypertension 12/25/2010    Allergies:  Allergies  Allergen Reactions   Ibuprofen Other (See Comments)    CANT TAKE DUE TO A ULCER CANT TAKE DUE TO A ULCER    Lisinopril  Medications:  Current Outpatient Medications:    albuterol (PROVENTIL HFA;VENTOLIN HFA) 108 (90 Base) MCG/ACT inhaler, TAKE 2 PUFFS BY MOUTH EVERY 4 HOURS AS NEEDED FOR WHEEZE, Disp: 18 Inhaler, Rfl: 2   amphetamine-dextroamphetamine (ADDERALL) 20 MG tablet, Take 20 mg by mouth 2 (two) times daily. 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 Please  keep upcoming office visit., Disp: 60 capsule, Rfl: 0   clonazePAM (KLONOPIN) 1 MG tablet, Take 1 tablet (1 mg total) by mouth 3 (three) times daily as needed., Disp: 90 tablet, Rfl: 0   fexofenadine (ALLEGRA ALLERGY) 180 MG tablet, Take 1 tablet (180 mg total) by mouth daily for 15 days., Disp: 15 tablet, Rfl: 0   Fluocinolone Acetonide 0.01 % OIL, Place 5 drops in ear(s) 2 (two) times daily. (Patient not taking: Reported on 08/12/2021), Disp: 20 mL, Rfl: 0   levothyroxine (SYNTHROID) 137 MCG tablet, TAKE 1 TABLET (137 MCG TOTAL) BY MOUTH DAILY BEFORE BREAKFAST., Disp: 30 tablet, Rfl: 2   montelukast (SINGULAIR) 10 MG tablet, TAKE 1 TABLET (10 MG TOTAL) BY MOUTH AT BEDTIME. (Patient taking differently: Take 10 mg by mouth daily.), Disp: 90 tablet, Rfl: 3   olmesartan (BENICAR) 20 MG tablet, Take 1 tablet (20 mg total) by mouth daily. (Patient taking differently: Take 40 mg by mouth daily.), Disp: 90 tablet, Rfl: 0   oxyCODONE-acetaminophen (PERCOCET) 10-325 MG tablet, Take 1 tablet by mouth every 6 (six) hours as needed for pain., Disp: 30 tablet, Rfl: 0   pantoprazole (PROTONIX) 40 MG tablet, Take 1 tablet (40 mg total) by mouth 2 (two) times daily. (Patient not taking: Reported on 08/12/2021), Disp: 60 tablet, Rfl: 11   predniSONE (DELTASONE) 20 MG tablet, Take 3 tabs PO daily x 5 days., Disp: 15 tablet, Rfl: 0   rizatriptan (MAXALT) 10 MG tablet, Take 1 tablet (10 mg total) by mouth as needed for migraine. May repeat in 2 hours if needed, Disp: 12 tablet, Rfl: 11   sertraline (ZOLOFT) 50 MG tablet, Take 150 mg by mouth daily. , Disp: , Rfl:    triamterene-hydrochlorothiazide (DYAZIDE) 37.5-25 MG capsule, TAKE 1 EACH (1 CAPSULE TOTAL) BY MOUTH DAILY., Disp: 30 capsule, Rfl: 2   verapamil (CALAN-SR) 180 MG CR tablet, Take 2 tablets (360 mg total) by mouth at bedtime. (Patient taking differently: Take 360 mg by mouth daily.), Disp: 180 tablet, Rfl: 3  Observations/Objective:  Patient is  well-developed, well-nourished in no acute distress.  Resting comfortably  at home.  Head is normocephalic, atraumatic.  No labored breathing.  Speech is clear and coherent with logical content.  Patient is alert and oriented at baseline.    Assessment and Plan:  1. Strep throat  - penicillin v potassium (VEETID) 500 MG tablet; Take 1 tablet (500 mg total) by mouth 2 (two) times daily for 10 days.  Dispense: 20 tablet; Refill: 0 - predniSONE (DELTASONE) 20 MG tablet; Take 1 tablet (20 mg total) by mouth daily with breakfast for 5 days.  Dispense: 5 tablet; Refill: 0  2. Pharyngitis, unspecified etiology  - penicillin v potassium (VEETID) 500 MG tablet; Take 1 tablet (500 mg total) by mouth 2 (two) times daily for 10 days.  Dispense: 20 tablet; Refill: 0 - predniSONE (DELTASONE) 20 MG tablet; Take 1 tablet (20 mg total) by mouth daily with breakfast for 5 days.  Dispense: 5 tablet; Refill: 0  3.  Fever, unspecified fever cause  - penicillin v potassium (VEETID) 500 MG tablet; Take 1 tablet (500 mg total) by mouth 2 (two) times daily for 10 days.  Dispense: 20 tablet; Refill: 0     Follow Up Instructions: I discussed the assessment and treatment plan with the patient. The patient was provided an opportunity to ask questions and all were answered. The patient agreed with the plan and demonstrated an understanding of the instructions.  A copy of instructions were sent to the patient via MyChart unless otherwise noted below.    The patient was advised to call back or seek an in-person evaluation if the symptoms worsen or if the condition fails to improve as anticipated.    Viviano Simas, FNP

## 2022-12-27 ENCOUNTER — Ambulatory Visit: Payer: 59

## 2022-12-30 ENCOUNTER — Ambulatory Visit
Admission: RE | Admit: 2022-12-30 | Discharge: 2022-12-30 | Disposition: A | Discharge status: Home / Self Care | Payer: 59 | Source: Ambulatory Visit | Attending: Family Medicine | Admitting: Family Medicine

## 2022-12-30 ENCOUNTER — Other Ambulatory Visit: Payer: Self-pay

## 2022-12-30 ENCOUNTER — Ambulatory Visit: Payer: 59

## 2022-12-30 VITALS — BP 176/93 | HR 97 | Temp 97.6°F | Resp 17

## 2022-12-30 DIAGNOSIS — R059 Cough, unspecified: Secondary | ICD-10-CM

## 2022-12-30 DIAGNOSIS — J329 Chronic sinusitis, unspecified: Secondary | ICD-10-CM | POA: Diagnosis not present

## 2022-12-30 DIAGNOSIS — J40 Bronchitis, not specified as acute or chronic: Secondary | ICD-10-CM | POA: Diagnosis not present

## 2022-12-30 MED ORDER — PREDNISONE 10 MG (21) PO TBPK: ORAL_TABLET | Freq: Every day | ORAL | 0 refills | Status: DC

## 2022-12-30 MED ORDER — HYDROCODONE BIT-HOMATROP MBR 5-1.5 MG/5ML PO SOLN: 5.0000 mL | Freq: Four times a day (QID) | ORAL | 0 refills | Status: DC | PRN

## 2022-12-30 MED ORDER — AMOXICILLIN-POT CLAVULANATE 875-125 MG PO TABS: 1.0000 | ORAL_TABLET | Freq: Two times a day (BID) | ORAL | 0 refills | Status: DC

## 2022-12-30 MED ORDER — BENZONATATE 200 MG PO CAPS: 200.0000 mg | ORAL_CAPSULE | Freq: Three times a day (TID) | ORAL | 0 refills | Status: AC | PRN

## 2022-12-30 NOTE — Discharge Instructions (Addendum)
Advised patient of chest x-ray results with hardcopy provided.  Instructed patient to discontinue previously prescribed prescriptions advised patient to take medications as directed with food to completion.  Advised patient to take prednisone with first dose of Augmentin until completion.  Advised may take Tessalon capsules daily or as needed for cough.  Advised may use Hycodan cough syrup at night prior to sleep for cough due to sedative effects.  Encouraged to increase daily water intake to 64 ounces per day while taking these medications.  Advised if symptoms worsen and/or unresolved please follow-up with PCP or here for further evaluation.

## 2022-12-30 NOTE — ED Provider Notes (Signed)
[name] CARE    CSN: [id number] Arrival date & time: 12/30/22  1557      History   Chief Complaint Chief Complaint  Patient presents with   Cough    HPI [name] is a 62 y.o. female.   HPI 62 year old female presents with cough and shortness of breath for 2 weeks.  Reports history of bronchitis and pneumonia.  Patient reports had virtual visit on 12/3 he was treated with penicillin and prednisone with no improvement.  PMH significant for obesity, HTN, and thyroid cancer.  Past Medical History:  Diagnosis Date   Allergy    Asthma    Chest pain    a. 12/2010 ETT: No ST/T changes.   Depression    Deviated septum    Diastolic dysfunction    a. 12/2010 Echo: EF 65-70%, no rwma, Gr2 DD, mild MR, mildly dil LA, triv TR.   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   Menopause 06/17/2011   Allergic rhinitis 05/13/2011   Reactive airway disease 05/13/2011   Depressed 05/13/2011   Migraine 05/13/2011   Thyroid cancer (HCC) 05/13/2011   Hypothyroidism, postsurgical 05/13/2011   Hypertension 12/25/2010    Past Surgical History:  Procedure Laterality Date   CESAREAN SECTION     CHOLECYSTECTOMY  03/26/2006   DILATION AND CURETTAGE OF UTERUS  06/30/2000   ESOPHAGOGASTRODUODENOSCOPY (EGD) WITH PROPOFOL N/A 04/12/2018   Procedure: ESOPHAGOGASTRODUODENOSCOPY (EGD) WITH PROPOFOL;  Surgeon: Wyline Mood, MD;  Location: [facility] ENDOSCOPY;  Service: Gastroenterology;  Laterality: N/A;   THYROIDECTOMY     PARATHYROIDECTOMY    OB History     Gravida  3   Para  1   Term       Preterm  1   AB  2   Living  1      SAB      IAB      Ectopic      Multiple      Live Births  1            Home Medications    Prior to Admission medications   Medication Sig Start Date End Date Taking? Authorizing Provider  amoxicillin-clavulanate (AUGMENTIN) 875-125 MG tablet Take 1 tablet by mouth every 12 (twelve) hours. 12/30/22  Yes Trevor Iha, FNP  benzonatate (TESSALON) 200 MG capsule Take 1 capsule (200 mg total) by mouth 3 (three) times daily as needed for up to 7 days. 12/30/22 01/06/23 Yes Trevor Iha, FNP  HYDROcodone bit-homatropine (HYCODAN) 5-1.5 MG/5ML syrup Take 5 mLs by mouth every 6 (six) hours as needed for cough. 12/30/22  Yes Trevor Iha, FNP  predniSONE (STERAPRED UNI-PAK 21 TAB) 10 MG (21) TBPK tablet Take by mouth daily. Take 6 tabs by mouth daily  for 2 days, then 5 tabs for 2 days, then 4 tabs for 2 days, then 3 tabs for 2 days, 2 tabs for 2 days, then 1 tab by mouth daily for 2 days 12/30/22  Yes Trevor Iha, FNP  albuterol (PROVENTIL HFA;VENTOLIN HFA) 108 (90 Base) MCG/ACT inhaler TAKE 2 PUFFS BY MOUTH EVERY 4  HOURS AS NEEDED FOR WHEEZE 12/14/17   Sherren Mocha, MD  amphetamine-dextroamphetamine (ADDERALL) 20 MG tablet Take 20 mg by mouth 2 (two) times daily. Takes only when working and at [employer]), Historical, MD  calcitRIOL (ROCALTROL) 0.25 MCG capsule TAKE 2 CAPSULES (0.5 MCG TOTAL) BY MOUTH DAILY. Please keep upcoming office visit. 07/02/18   Lezlie Lye, Meda Coffee, MD  clonazePAM (KLONOPIN) 1 MG tablet Take 1 tablet (1 mg total) by mouth 3 (three) times daily as needed. 01/12/18   Sherren Mocha, MD  fexofenadine [facility] ALLERGY) 180 MG tablet Take 1 tablet (180 mg total) by mouth daily for 15 days. 08/12/21 08/27/21  Trevor Iha, FNP  Fluocinolone Acetonide 0.01 % OIL Place 5 drops in ear(s) 2 (two) times daily. Patient not taking: Reported on 08/12/2021 01/12/18   Sherren Mocha, MD  levothyroxine (SYNTHROID) 137 MCG tablet TAKE  1 TABLET (137 MCG TOTAL) BY MOUTH DAILY BEFORE BREAKFAST. 05/09/18   Leanora Ivanoff, PA-C  montelukast (SINGULAIR) 10 MG tablet TAKE 1 TABLET (10 MG TOTAL) BY MOUTH AT BEDTIME. Patient taking differently: Take 10 mg by mouth daily. 12/14/17   Sherren Mocha, MD  olmesartan (BENICAR) 20 MG tablet Take 1 tablet (20 mg total) by mouth daily. Patient taking differently: Take 40 mg by mouth daily. 01/12/18   Sherren Mocha, MD  oxyCODONE-acetaminophen (PERCOCET) 10-325 MG tablet Take 1 tablet by mouth every 6 (six) hours as needed for pain. 01/12/18   Sherren Mocha, MD  pantoprazole (PROTONIX) 40 MG tablet Take 1 tablet (40 mg total) by mouth 2 (two) times daily. Patient not taking: Reported on 08/12/2021 04/13/18 04/13/19  Ihor Austin, MD  rizatriptan (MAXALT) 10 MG tablet Take 1 tablet (10 mg total) by mouth as needed for migraine. May repeat in 2 hours if needed 12/21/17   Sherren Mocha, MD  sertraline (ZOLOFT) 50 MG tablet Take 150 mg by mouth daily.     [provider]  triamterene-hydrochlorothiazide (DYAZIDE) 37.5-25 MG capsule TAKE 1 EACH (1 CAPSULE TOTAL) BY MOUTH DAILY. 04/30/18   Leanora Ivanoff, PA-C  verapamil (CALAN-SR) 180 MG CR tablet Take 2 tablets (360 mg total) by mouth at bedtime. Patient taking differently: Take 360 mg by mouth daily. 07/11/17   Sherren Mocha, MD    Family History Family History  Problem Relation Age of Onset   Heart disease Father        CABG in his 90s   Hyperlipidemia Father    Heart disease Brother        CABG   Heart disease Sister        Stents in 54s   Hyperlipidemia Sister    Heart disease Sister        CABG in 5s   Hyperlipidemia Sister    Hyperlipidemia Mother    Breast cancer Maternal Aunt    Hyperlipidemia Maternal Uncle    Hyperlipidemia Maternal Grandmother    Breast cancer Maternal Grandmother    Hyperlipidemia Paternal Grandmother    Breast cancer Paternal Grandmother     Social History Social History   Tobacco Use   Smoking status: Former     Current packs/day: 0.00    Average packs/day: 2.0 packs/day for 20.0 years (40.0 ttl pk-yrs)    Types: Cigarettes    Start date: 05/13/1970    Quit date: 05/13/1990    Years since quitting: 32.6   Smokeless tobacco: Never  Vaping Use   Vaping status: Never  Used  Substance Use Topics   Alcohol use: No    Comment: Rarely   Drug use: No     Allergies   Ibuprofen and Lisinopril   Review of Systems Review of Systems  Respiratory:  Positive for cough and shortness of breath.   All other systems reviewed and are negative.    Physical Exam Triage Vital Signs ED Triage Vitals  Encounter Vitals Group     BP 12/30/22 1616 (!) 176/93     Systolic BP Percentile --      Diastolic BP Percentile --      Pulse Rate 12/30/22 1616 97     Resp 12/30/22 1616 17     Temp 12/30/22 1616 97.6 F (36.4 C)     Temp Source 12/30/22 1616 Oral     SpO2 12/30/22 1616 97 %     Weight --      Height --      Head Circumference --      Peak Flow --      Pain Score 12/30/22 1617 0     Pain Loc --      Pain Education --      Exclude from Growth Chart --    No data found.  Updated Vital Signs BP (!) 176/93 (BP Location: Right Arm)   Pulse 97   Temp 97.6 F (36.4 C) (Oral)   Resp 17   LMP 06/17/2002   SpO2 97%    Physical Exam Vitals and nursing note reviewed.  Constitutional:      Appearance: Normal appearance. She is obese. She is ill-appearing.  HENT:     Head: Normocephalic and atraumatic.     Right Ear: Tympanic membrane and external ear normal.     Left Ear: Tympanic membrane and external ear normal.     Ears:     Comments: Eustachian tube dysfunction noted bilaterally    Nose:     Comments: Turbinates are erythematous/edematous    Mouth/Throat:     Mouth: Mucous membranes are moist.     Pharynx: Oropharynx is clear.  Eyes:     Extraocular Movements: Extraocular movements intact.     Conjunctiva/sclera: Conjunctivae normal.     Pupils: Pupils are equal, round, and  reactive to light.  Cardiovascular:     Rate and Rhythm: Normal rate and regular rhythm.     Pulses: Normal pulses.     Heart sounds: Normal heart sounds.  Pulmonary:     Effort: Pulmonary effort is normal.     Breath sounds: Rhonchi present. No wheezing or rales.     Comments: Diffuse scattered rhonchi noted throughout, infrequent nonproductive cough on exam Musculoskeletal:        General: Normal range of motion.     Cervical back: Normal range of motion and neck supple. No tenderness.  Lymphadenopathy:     Cervical: No cervical adenopathy.  Skin:    General: Skin is warm and dry.  Neurological:     General: No focal deficit present.     Mental Status: She is alert and oriented to person, place, and time. Mental status is at baseline.  Psychiatric:        Mood and Affect: Mood normal.        Behavior: Behavior normal.        Thought Content: Thought content normal.      UC Treatments / Results  Labs (all labs ordered are listed, but only abnormal results are displayed) Labs Reviewed -  No data to display  EKG   Radiology DG Chest 2 View  Result Date: 12/30/2022 CLINICAL DATA:  Cough for 2 weeks. EXAM: CHEST - 2 VIEW COMPARISON:  April 11, 2018. FINDINGS: The heart size and mediastinal contours are within normal limits. Both lungs are clear. The visualized skeletal structures are unremarkable. IMPRESSION: No active cardiopulmonary disease. Electronically Signed   By: Lupita Raider M.D.   On: 12/30/2022 17:19    Procedures Procedures (including critical care time)  Medications Ordered in UC Medications - No data to display  Initial Impression / Assessment and Plan / UC Course  I have reviewed the triage vital signs and the nursing notes.  Pertinent labs & imaging results that were available during my care of the patient were reviewed by me and considered in my medical decision making (see chart for details).     MDM: 1.  Cough, unspecified type-CXR reveals  above, Rx'd Tessalon 200 mg capsules: Take 1 capsule 3 times daily, as needed, Hycodan 5-1.5 mg / 5 mL syrup: Take 5 mL every 6 hours for cough, as needed; 2.  Sinobronchitis-Rx'd Augmentin 875/125 mg tablet: Take 1 tablet twice daily x 7 days, Rx'd Sterapred Unipak (tapering from 60 mg to 10 mg over 10 days). Advised patient of chest x-ray results with hardcopy provided.  Instructed patient to discontinue previously prescribed prescriptions advised patient to take medications as directed with food to completion.  Advised patient to take prednisone with first dose of Augmentin until completion.  Advised may take Tessalon capsules daily or as needed for cough.  Advised may use Hycodan cough syrup at night prior to sleep for cough due to sedative effects.  Encouraged to increase daily water intake to 64 ounces per day while taking these medications.  Advised if symptoms worsen and/or unresolved please follow-up with PCP or here for further evaluation.  Patient discharged home, hemodynamically stable. Final Clinical Impressions(s) / UC Diagnoses   Final diagnoses:  Sinobronchitis  Cough, unspecified type     Discharge Instructions      Advised patient of chest x-ray results with hardcopy provided.  Instructed patient to discontinue previously prescribed prescriptions advised patient to take medications as directed with food to completion.  Advised patient to take prednisone with first dose of Augmentin until completion.  Advised may take Tessalon capsules daily or as needed for cough.  Advised may use Hycodan cough syrup at night prior to sleep for cough due to sedative effects.  Encouraged to increase daily water intake to 64 ounces per day while taking these medications.  Advised if symptoms worsen and/or unresolved please follow-up with PCP or here for further evaluation.     ED Prescriptions     Medication Sig Dispense Auth. Provider   amoxicillin-clavulanate (AUGMENTIN) 875-125 MG tablet Take 1  tablet by mouth every 12 (twelve) hours. 14 tablet Trevor Iha, FNP   predniSONE (STERAPRED UNI-PAK 21 TAB) 10 MG (21) TBPK tablet Take by mouth daily. Take 6 tabs by mouth daily  for 2 days, then 5 tabs for 2 days, then 4 tabs for 2 days, then 3 tabs for 2 days, 2 tabs for 2 days, then 1 tab by mouth daily for 2 days 42 tablet Trevor Iha, FNP   benzonatate (TESSALON) 200 MG capsule Take 1 capsule (200 mg total) by mouth 3 (three) times daily as needed for up to 7 days. 40 capsule Trevor Iha, FNP   HYDROcodone bit-homatropine (HYCODAN) 5-1.5 MG/5ML syrup Take 5 mLs by  mouth every 6 (six) hours as needed for cough. 120 mL Trevor Iha, FNP      I have reviewed the PDMP during this encounter.   Trevor Iha, FNP 12/30/22 1750

## 2022-12-30 NOTE — ED Triage Notes (Addendum)
Pt c/o cough and shortness of breath x 2 weeks. Hx of bronchitis and pneumonia. Taking advil and dayquil prn. Had virtual visit on 12/3. Tx with PCN and prednisone. No improvement.

## 2023-03-09 DIAGNOSIS — Z79899 Other long term (current) drug therapy: Secondary | ICD-10-CM | POA: Diagnosis not present

## 2023-03-29 ENCOUNTER — Telehealth: Payer: Self-pay | Admitting: Family Medicine

## 2023-03-29 ENCOUNTER — Other Ambulatory Visit: Payer: Self-pay

## 2023-03-29 ENCOUNTER — Ambulatory Visit
Admission: RE | Admit: 2023-03-29 | Discharge: 2023-03-29 | Disposition: A | Discharge status: Home / Self Care | Source: Ambulatory Visit | Attending: Family Medicine | Admitting: Family Medicine

## 2023-03-29 VITALS — BP 151/100 | HR 100 | Temp 98.0°F | Resp 18

## 2023-03-29 DIAGNOSIS — R059 Cough, unspecified: Secondary | ICD-10-CM

## 2023-03-29 DIAGNOSIS — R062 Wheezing: Secondary | ICD-10-CM | POA: Diagnosis not present

## 2023-03-29 DIAGNOSIS — J069 Acute upper respiratory infection, unspecified: Secondary | ICD-10-CM

## 2023-03-29 MED ORDER — AEROCHAMBER PLUS FLO-VU MEDIUM MISC
1.0000 | Freq: Once | Status: AC
  Administered 2023-03-29: 1

## 2023-03-29 MED ORDER — AZITHROMYCIN 250 MG PO TABS: 250.0000 mg | ORAL_TABLET | Freq: Every day | ORAL | 0 refills | Status: DC

## 2023-03-29 MED ORDER — BENZONATATE 200 MG PO CAPS: 200.0000 mg | ORAL_CAPSULE | Freq: Three times a day (TID) | ORAL | 0 refills | Status: AC | PRN

## 2023-03-29 MED ORDER — PREDNISONE 20 MG PO TABS: ORAL_TABLET | ORAL | 0 refills | Status: DC

## 2023-03-29 MED ORDER — HYDROCODONE BIT-HOMATROP MBR 5-1.5 MG/5ML PO SOLN: 5.0000 mL | Freq: Four times a day (QID) | ORAL | 0 refills | Status: DC | PRN

## 2023-03-29 MED ORDER — ALBUTEROL SULFATE HFA 108 (90 BASE) MCG/ACT IN AERS
1.0000 | INHALATION_SPRAY | Freq: Four times a day (QID) | RESPIRATORY_TRACT | Status: DC | PRN
  Administered 2023-03-29: 2 via RESPIRATORY_TRACT

## 2023-03-29 NOTE — ED Triage Notes (Signed)
 Pt presents to uc with co of uri/ respiratory wheezing for 3 days. Pt has been taking otc cough medications

## 2023-03-29 NOTE — Telephone Encounter (Signed)
 Unable to send Hycodan initially due to electronic malfunction.  Hycodan cough syrup sent to patient's pharmacy per request.

## 2023-03-29 NOTE — Discharge Instructions (Addendum)
 Advised patient to take medications as directed with food to completion.  Advised patient to take prednisone with Zithromax daily to completion.  Advised may use albuterol inhaler/airspace spacer as needed for wheezing.  Advised may use Tessalon capsules daily or as needed for cough.  Advised may use Hycodan cough syrup at night prior to sleep for cough due to sedative effects.  Encouraged increase daily water intake to 64 ounces per day while taking these medications.  Advised if symptoms worsen and/or unresolved please follow-up with your PCP or here for further evaluation.

## 2023-03-29 NOTE — ED Provider Notes (Signed)
 [name] CARE    CSN: [id number] Arrival date & time: 03/29/23  0858      History   Chief Complaint Chief Complaint  Patient presents with   Wheezing    Respiratory infection - Entered by patient    HPI [name] is a 63 y.o. female.   HPI Pleasant 63 year old female presents with cough and wheezing for days.  PMH significant for thyroid cancer, HTN, and hypothyroidism.  Past Medical History:  Diagnosis Date   Allergy    Asthma    Chest pain    a. 12/2010 ETT: No ST/T changes.   Depression    Deviated septum    Diastolic dysfunction    a. 12/2010 Echo: EF 65-70%, no rwma, Gr2 DD, mild MR, mildly dil LA, triv TR.   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   Menopause 06/17/2011   Allergic rhinitis 05/13/2011   Reactive airway disease 05/13/2011   Depressed 05/13/2011   Migraine 05/13/2011   Thyroid cancer (HCC) 05/13/2011   Hypothyroidism, postsurgical 05/13/2011   Hypertension 12/25/2010    Past Surgical History:  Procedure Laterality Date   CESAREAN SECTION     CHOLECYSTECTOMY  03/26/2006   DILATION AND CURETTAGE OF UTERUS  06/30/2000   ESOPHAGOGASTRODUODENOSCOPY (EGD) WITH PROPOFOL N/A 04/12/2018   Procedure: ESOPHAGOGASTRODUODENOSCOPY (EGD) WITH PROPOFOL;  Surgeon: Wyline Mood, MD;  Location: [facility] ENDOSCOPY;  Service: Gastroenterology;  Laterality: N/A;   THYROIDECTOMY     PARATHYROIDECTOMY    OB History     Gravida  3   Para  1   Term      Preterm  1   AB  2   Living  1      SAB      IAB      Ectopic      Multiple      Live  Births  1            Home Medications    Prior to Admission medications   Medication Sig Start Date End Date Taking? Authorizing Provider  azithromycin (ZITHROMAX) 250 MG tablet Take 1 tablet (250 mg total) by mouth daily. Take first 2 tablets together, then 1 every day until finished. 03/29/23  Yes Trevor Iha, FNP  benzonatate (TESSALON) 200 MG capsule Take 1 capsule (200 mg total) by mouth 3 (three) times daily as needed for up to 7 days. 03/29/23 04/05/23 Yes Trevor Iha, FNP  predniSONE (DELTASONE) 20 MG tablet Take 3 tabs PO daily x 5 days. 03/29/23  Yes Trevor Iha, FNP  albuterol (PROVENTIL HFA;VENTOLIN HFA) 108 (90 Base) MCG/ACT inhaler TAKE 2 PUFFS BY MOUTH EVERY 4 HOURS AS NEEDED FOR WHEEZE 12/14/17   Sherren Mocha, MD  amphetamine-dextroamphetamine (ADDERALL) 20 MG tablet Take 20 mg by mouth 2 (two) times daily. Takes only when working and at [employer]), Historical, MD  calcitRIOL (ROCALTROL) 0.25 MCG capsule TAKE 2 CAPSULES (0.5 MCG TOTAL) BY MOUTH DAILY. Please keep upcoming office visit. 07/02/18   Lezlie Lye, Meda Coffee, MD  clonazePAM (KLONOPIN) 1 MG tablet Take 1  tablet (1 mg total) by mouth 3 (three) times daily as needed. 01/12/18   Sherren Mocha, MD  fexofenadine [facility] ALLERGY) 180 MG tablet Take 1 tablet (180 mg total) by mouth daily for 15 days. 08/12/21 08/27/21  Trevor Iha, FNP  levothyroxine (SYNTHROID) 137 MCG tablet TAKE 1 TABLET (137 MCG TOTAL) BY MOUTH DAILY BEFORE BREAKFAST. 05/09/18   Leanora Ivanoff, PA-C  montelukast (SINGULAIR) 10 MG tablet TAKE 1 TABLET (10 MG TOTAL) BY MOUTH AT BEDTIME. Patient taking differently: Take 10 mg by mouth daily. 12/14/17   Sherren Mocha, MD  olmesartan (BENICAR) 20 MG tablet Take 1 tablet (20 mg total) by mouth daily. Patient taking differently: Take 40 mg by mouth daily. 01/12/18   Sherren Mocha, MD  oxyCODONE-acetaminophen (PERCOCET) 10-325 MG tablet Take 1 tablet by mouth every 6 (six) hours as needed for pain. 01/12/18    Sherren Mocha, MD  pantoprazole (PROTONIX) 40 MG tablet Take 1 tablet (40 mg total) by mouth 2 (two) times daily. Patient not taking: Reported on 08/12/2021 04/13/18 04/13/19  Ihor Austin, MD  rizatriptan (MAXALT) 10 MG tablet Take 1 tablet (10 mg total) by mouth as needed for migraine. May repeat in 2 hours if needed 12/21/17   Sherren Mocha, MD  sertraline (ZOLOFT) 50 MG tablet Take 150 mg by mouth daily.     [provider]  triamterene-hydrochlorothiazide (DYAZIDE) 37.5-25 MG capsule TAKE 1 EACH (1 CAPSULE TOTAL) BY MOUTH DAILY. 04/30/18   Leanora Ivanoff, PA-C  verapamil (CALAN-SR) 180 MG CR tablet Take 2 tablets (360 mg total) by mouth at bedtime. Patient taking differently: Take 360 mg by mouth daily. 07/11/17   Sherren Mocha, MD    Family History Family History  Problem Relation Age of Onset   Heart disease Father        CABG in his 71s   Hyperlipidemia Father    Heart disease Brother        CABG   Heart disease Sister        Stents in 80s   Hyperlipidemia Sister    Heart disease Sister        CABG in 60s   Hyperlipidemia Sister    Hyperlipidemia Mother    Breast cancer Maternal Aunt    Hyperlipidemia Maternal Uncle    Hyperlipidemia Maternal Grandmother    Breast cancer Maternal Grandmother    Hyperlipidemia Paternal Grandmother    Breast cancer Paternal Grandmother     Social History Social History   Tobacco Use   Smoking status: Former    Current packs/day: 0.00    Average packs/day: 2.0 packs/day for 20.0 years (40.0 ttl pk-yrs)    Types: Cigarettes    Start date: 05/13/1970    Quit date: 05/13/1990    Years since quitting: 32.8   Smokeless tobacco: Never  Vaping Use   Vaping status: Never Used  Substance Use Topics   Alcohol use: No    Comment: Rarely   Drug use: No     Allergies   Ibuprofen and Lisinopril   Review of Systems Review of Systems  Respiratory:  Positive for wheezing.   All other systems reviewed and are negative.    Physical  Exam Triage Vital Signs ED Triage Vitals  Encounter Vitals Group     BP      Systolic BP Percentile      Diastolic BP Percentile      Pulse      Resp  Temp      Temp src      SpO2      Weight      Height      Head Circumference      Peak Flow      Pain Score      Pain Loc      Pain Education      Exclude from Growth Chart    No data found.  Updated Vital Signs BP (!) 151/100   Pulse 100   Temp 98 F (36.7 C)   Resp 18   LMP 06/17/2002   SpO2 98%     Physical Exam Vitals and nursing note reviewed.  Constitutional:      Appearance: Normal appearance. She is obese. She is ill-appearing.  HENT:     Head: Normocephalic and atraumatic.     Right Ear: Tympanic membrane, ear canal and external ear normal.     Left Ear: Tympanic membrane, ear canal and external ear normal.     Mouth/Throat:     Mouth: Mucous membranes are moist.     Pharynx: Oropharynx is clear.  Eyes:     Extraocular Movements: Extraocular movements intact.     Conjunctiva/sclera: Conjunctivae normal.     Pupils: Pupils are equal, round, and reactive to light.  Cardiovascular:     Rate and Rhythm: Normal rate and regular rhythm.     Pulses: Normal pulses.     Heart sounds: Normal heart sounds.  Pulmonary:     Effort: Pulmonary effort is normal.     Breath sounds: Normal breath sounds. No wheezing, rhonchi or rales.     Comments: Frequent nonproductive cough on exam Musculoskeletal:        General: Normal range of motion.     Cervical back: Normal range of motion and neck supple.  Skin:    General: Skin is warm and dry.  Neurological:     General: No focal deficit present.     Mental Status: She is alert and oriented to person, place, and time. Mental status is at baseline.  Psychiatric:        Mood and Affect: Mood normal.        Behavior: Behavior normal.      UC Treatments / Results  Labs (all labs ordered are listed, but only abnormal results are displayed) Labs Reviewed - No  data to display  EKG   Radiology No results found.  Procedures Procedures (including critical care time)  Medications Ordered in UC Medications  albuterol (VENTOLIN HFA) 108 (90 Base) MCG/ACT inhaler 1-2 puff (2 puffs Inhalation Given 03/29/23 0948)  AeroChamber Plus Flo-Vu Medium MISC 1 each (1 each Other Given 03/29/23 0948)    Initial Impression / Assessment and Plan / UC Course  I have reviewed the triage vital signs and the nursing notes.  Pertinent labs & imaging results that were available during my care of the patient were reviewed by me and considered in my medical decision making (see chart for details).     MDM: 1.  Acute URI-Rx'd Zithromax 500 mg day 1, then 250 mg day 2-5; 2.  Wheezing-Rx'd prednisone 20 mg tablet: Take 3 tabs p.o. daily x 5 days; 3.  Cough, unspecified type Rx'd Tessalon 200 mg capsule: Take 1 capsule 3 times daily, as needed for cough, Rx'd Hycodan 5-1.5 mg / 5 mL syrup: Take 5 mL every 6 hours for cough. Advised patient to take medications as directed with food to completion.  Advised patient to take prednisone with Zithromax daily to completion.  Advised may use albuterol inhaler/airspace spacer as needed for wheezing.  Advised may use Tessalon capsules daily or as needed for cough.  Advised may use Hycodan cough syrup at night prior to sleep for cough due to sedative effects.  Encouraged increase daily water intake to 64 ounces per day while taking these medications.  Advised if symptoms worsen and/or unresolved please follow-up with your PCP or here for further evaluation. Final Clinical Impressions(s) / UC Diagnoses   Final diagnoses:  Wheezing  Acute URI  Cough, unspecified type     Discharge Instructions      Advised patient to take medications as directed with food to completion.  Advised patient to take prednisone with Zithromax daily to completion.  Advised may use albuterol inhaler/airspace spacer as needed for wheezing.  Advised may use  Tessalon capsules daily or as needed for cough.  Advised may use Hycodan cough syrup at night prior to sleep for cough due to sedative effects.  Encouraged increase daily water intake to 64 ounces per day while taking these medications.  Advised if symptoms worsen and/or unresolved please follow-up with your PCP or here for further evaluation.     ED Prescriptions     Medication Sig Dispense Auth. Provider   azithromycin (ZITHROMAX) 250 MG tablet Take 1 tablet (250 mg total) by mouth daily. Take first 2 tablets together, then 1 every day until finished. 6 tablet Trevor Iha, FNP   predniSONE (DELTASONE) 20 MG tablet Take 3 tabs PO daily x 5 days. 15 tablet Trevor Iha, FNP   benzonatate (TESSALON) 200 MG capsule Take 1 capsule (200 mg total) by mouth 3 (three) times daily as needed for up to 7 days. 40 capsule Trevor Iha, FNP      I have reviewed the PDMP during this encounter.   Trevor Iha, FNP 03/29/23 1023

## 2023-04-29 ENCOUNTER — Other Ambulatory Visit: Payer: Self-pay

## 2023-04-29 MED ORDER — NYSTATIN 100000 UNIT/ML MT SUSP
5.0000 mL | Freq: Three times a day (TID) | OROMUCOSAL | 0 refills | Status: DC | PRN
  Filled 2023-04-29: qty 300, 20d supply, fill #0

## 2023-04-29 MED ORDER — CALCITRIOL 0.25 MCG PO CAPS
0.5000 ug | ORAL_CAPSULE | Freq: Every day | ORAL | 0 refills | Status: DC
  Filled 2023-04-29: qty 180, 90d supply, fill #0
  Filled 2023-04-30: qty 80, 40d supply, fill #0
  Filled 2023-04-30: qty 100, 50d supply, fill #0

## 2023-04-29 MED ORDER — TRIAMTERENE-HCTZ 37.5-25 MG PO CAPS
1.0000 | ORAL_CAPSULE | Freq: Every day | ORAL | 0 refills | Status: DC
  Filled 2023-04-29: qty 90, 90d supply, fill #0

## 2023-04-29 MED ORDER — LEVOTHYROXINE SODIUM 150 MCG PO TABS
150.0000 ug | ORAL_TABLET | Freq: Every morning | ORAL | 1 refills | Status: AC
  Filled 2023-04-29: qty 90, 90d supply, fill #0
  Filled 2023-07-14: qty 90, 90d supply, fill #1

## 2023-04-29 MED ORDER — CLONAZEPAM 1 MG PO TBDP
1.0000 mg | ORAL_TABLET | Freq: Two times a day (BID) | ORAL | 0 refills | Status: AC
  Filled 2023-04-29: qty 60, 30d supply, fill #0

## 2023-04-29 MED ORDER — SERTRALINE HCL 100 MG PO TABS
150.0000 mg | ORAL_TABLET | Freq: Every day | ORAL | 0 refills | Status: DC
  Filled 2023-04-29: qty 135, 90d supply, fill #0

## 2023-04-29 MED ORDER — AMPHETAMINE-DEXTROAMPHETAMINE 20 MG PO TABS
20.0000 mg | ORAL_TABLET | Freq: Two times a day (BID) | ORAL | 0 refills | Status: DC
  Filled 2023-04-29: qty 60, 30d supply, fill #0

## 2023-04-30 ENCOUNTER — Other Ambulatory Visit: Payer: Self-pay

## 2023-05-01 ENCOUNTER — Other Ambulatory Visit: Payer: Self-pay

## 2023-05-20 DIAGNOSIS — F909 Attention-deficit hyperactivity disorder, unspecified type: Secondary | ICD-10-CM | POA: Diagnosis not present

## 2023-05-20 DIAGNOSIS — Z9049 Acquired absence of other specified parts of digestive tract: Secondary | ICD-10-CM | POA: Diagnosis not present

## 2023-05-20 DIAGNOSIS — B0230 Zoster ocular disease, unspecified: Secondary | ICD-10-CM | POA: Diagnosis not present

## 2023-05-20 DIAGNOSIS — Z85850 Personal history of malignant neoplasm of thyroid: Secondary | ICD-10-CM | POA: Diagnosis not present

## 2023-05-21 ENCOUNTER — Other Ambulatory Visit: Payer: Self-pay

## 2023-05-21 MED ORDER — VALACYCLOVIR HCL 1 G PO TABS
1000.0000 mg | ORAL_TABLET | Freq: Three times a day (TID) | ORAL | 0 refills | Status: AC
  Filled 2023-05-21: qty 21, 7d supply, fill #0

## 2023-05-27 ENCOUNTER — Other Ambulatory Visit: Payer: Self-pay

## 2023-06-12 DIAGNOSIS — H04123 Dry eye syndrome of bilateral lacrimal glands: Secondary | ICD-10-CM | POA: Diagnosis not present

## 2023-06-12 DIAGNOSIS — H18231 Secondary corneal edema, right eye: Secondary | ICD-10-CM | POA: Diagnosis not present

## 2023-07-09 ENCOUNTER — Other Ambulatory Visit: Payer: Self-pay

## 2023-07-09 MED ORDER — AMPHETAMINE-DEXTROAMPHETAMINE 20 MG PO TABS
20.0000 mg | ORAL_TABLET | Freq: Two times a day (BID) | ORAL | 0 refills | Status: DC
  Filled 2023-07-09: qty 60, 30d supply, fill #0

## 2023-07-14 ENCOUNTER — Other Ambulatory Visit: Payer: Self-pay

## 2023-07-15 ENCOUNTER — Encounter: Payer: Self-pay | Admitting: Pharmacist

## 2023-07-15 ENCOUNTER — Other Ambulatory Visit: Payer: Self-pay

## 2023-08-02 ENCOUNTER — Other Ambulatory Visit: Payer: Self-pay

## 2023-08-03 ENCOUNTER — Other Ambulatory Visit: Payer: Self-pay

## 2023-08-03 MED ORDER — TRIAMTERENE-HCTZ 37.5-25 MG PO CAPS
1.0000 | ORAL_CAPSULE | Freq: Every day | ORAL | 0 refills | Status: DC
  Filled 2023-08-03: qty 90, 90d supply, fill #0

## 2023-08-03 MED ORDER — SERTRALINE HCL 150 MG PO CAPS
150.0000 mg | ORAL_CAPSULE | Freq: Every day | ORAL | 0 refills | Status: DC
  Filled 2023-08-03: qty 30, 30d supply, fill #0

## 2023-08-03 MED ORDER — TRIAMTERENE-HCTZ 37.5-25 MG PO TABS
1.0000 | ORAL_TABLET | Freq: Every morning | ORAL | 0 refills | Status: DC
  Filled 2023-08-03: qty 30, 30d supply, fill #0

## 2023-08-03 MED ORDER — LEVOTHYROXINE SODIUM 150 MCG PO TABS: 150.0000 ug | ORAL_TABLET | Freq: Every morning | ORAL | 0 refills | Status: DC

## 2023-08-03 MED ORDER — TRIAMTERENE-HCTZ 37.5-25 MG PO CAPS
1.0000 | ORAL_CAPSULE | Freq: Every day | ORAL | 0 refills | Status: DC
  Filled 2023-08-03: qty 30, 30d supply, fill #0

## 2023-08-04 ENCOUNTER — Other Ambulatory Visit: Payer: Self-pay

## 2023-08-04 MED ORDER — LEVOTHYROXINE SODIUM 150 MCG PO TABS
150.0000 ug | ORAL_TABLET | Freq: Every morning | ORAL | 0 refills | Status: DC
  Filled 2023-08-04: qty 30, 30d supply, fill #0

## 2023-08-04 MED ORDER — SERTRALINE HCL 100 MG PO TABS
150.0000 mg | ORAL_TABLET | Freq: Every day | ORAL | 0 refills | Status: AC
  Filled 2023-08-04: qty 45, 30d supply, fill #0

## 2023-08-04 MED ORDER — CALCITRIOL 0.25 MCG PO CAPS
0.2500 ug | ORAL_CAPSULE | Freq: Two times a day (BID) | ORAL | 0 refills | Status: DC
  Filled 2023-08-04: qty 60, 30d supply, fill #0

## 2023-08-05 ENCOUNTER — Other Ambulatory Visit: Payer: Self-pay

## 2023-08-06 ENCOUNTER — Other Ambulatory Visit: Payer: Self-pay

## 2023-08-21 ENCOUNTER — Other Ambulatory Visit: Payer: Self-pay

## 2023-09-25 ENCOUNTER — Other Ambulatory Visit: Payer: Self-pay

## 2023-10-17 ENCOUNTER — Ambulatory Visit
Admission: EM | Admit: 2023-10-17 | Discharge: 2023-10-17 | Disposition: A | Discharge status: Home / Self Care | Attending: Family Medicine | Admitting: Family Medicine

## 2023-10-17 DIAGNOSIS — J019 Acute sinusitis, unspecified: Secondary | ICD-10-CM | POA: Diagnosis not present

## 2023-10-17 MED ORDER — PREDNISONE 20 MG PO TABS: 40.0000 mg | ORAL_TABLET | Freq: Every day | ORAL | 0 refills | Status: AC

## 2023-10-17 MED ORDER — AMOXICILLIN-POT CLAVULANATE 875-125 MG PO TABS: 1.0000 | ORAL_TABLET | Freq: Two times a day (BID) | ORAL | 0 refills | Status: AC

## 2023-10-17 NOTE — Discharge Instructions (Signed)
 Take the Augmentin  antibiotic 2 times a day Take with food Consider taking a probiotic while you are on Augmentin  Take prednisone  once a day in the morning for the next 5 days Make sure you are drinking lots of water Use the saline or salt water nasal rinse See your doctor if not improved by next week

## 2023-10-17 NOTE — ED Provider Notes (Signed)
 [facility]    CSN: [id number] Arrival date & time: 10/17/23  1305      History   Chief Complaint Chief Complaint  Patient presents with   Headache    HPI [name] is a 63 y.o. female.   Patient states that she is prone to sinus infections.  Anytime she gets a head cold it turns into an infection that requires antibiotics.  Currently has been having symptoms for 2 to 3 weeks.  She has tried some over-the-counter medicines.  She states she does not really like taking antibiotics ahead avoided coming in.  Today, the headache and face pain is more severe.  She is having bloody nasal discharge.  She is feeling tired. No cough or chest congestion.    Past Medical History:  Diagnosis Date   Allergy    Asthma    Chest pain    a. 12/2010 ETT: No ST/T changes.   Depression    Deviated septum    Diastolic dysfunction    a. 12/2010 Echo: EF 65-70%, no rwma, Gr2 DD, mild MR, mildly dil LA, triv TR.   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   Menopause 06/17/2011   Allergic rhinitis 05/13/2011   Reactive airway disease 05/13/2011   Depressed 05/13/2011   Migraine 05/13/2011   Thyroid  cancer (HCC) 05/13/2011   Hypothyroidism, postsurgical 05/13/2011   Hypertension 12/25/2010    Past Surgical History:  Procedure Laterality Date   CESAREAN SECTION     CHOLECYSTECTOMY  03/26/2006   DILATION AND CURETTAGE OF UTERUS  06/30/2000   ESOPHAGOGASTRODUODENOSCOPY (EGD) WITH PROPOFOL  N/A 04/12/2018   Procedure: ESOPHAGOGASTRODUODENOSCOPY (EGD) WITH PROPOFOL ;  Surgeon: Therisa Bi, MD;   Location: [facility] ENDOSCOPY;  Service: Gastroenterology;  Laterality: N/A;   THYROIDECTOMY     PARATHYROIDECTOMY    OB History     Gravida  3   Para  1   Term      Preterm  1   AB  2   Living  1      SAB      IAB      Ectopic      Multiple      Live Births  1            Home Medications    Prior to Admission medications   Medication Sig Start Date End Date Taking? Authorizing Provider  albuterol  (PROVENTIL  HFA;VENTOLIN  HFA) 108 (90 Base) MCG/ACT inhaler TAKE 2 PUFFS BY MOUTH EVERY 4 HOURS AS NEEDED FOR WHEEZE 12/14/17  Yes Loreli Elyn SAILOR, MD  amoxicillin -clavulanate (AUGMENTIN ) 875-125 MG tablet Take 1 tablet by mouth every 12 (twelve) hours. 10/17/23  Yes Maranda Jamee Jacob, MD  amphetamine -dextroamphetamine  (ADDERALL) 20 MG tablet Take 20 mg by mouth 2 (two) times daily. Takes only when working and at school   Yes [provider]  calcitRIOL  (ROCALTROL ) 0.25 MCG capsule TAKE 2 CAPSULES (0.5 MCG TOTAL) BY MOUTH DAILY. Please keep upcoming office visit. 07/02/18  Yes Melonie Colonel, Mikel HERO, MD  clonazePAM  (KLONOPIN ) 1 MG disintegrating tablet Take 1 tablet (1 mg total) by  mouth 2 (two) times daily. 04/28/23  Yes   levothyroxine  (SYNTHROID ) 150 MCG tablet Take 1 tablet (150 mcg total) by mouth every morning. 04/28/23  Yes   predniSONE  (DELTASONE ) 20 MG tablet Take 2 tablets (40 mg total) by mouth daily with breakfast. 10/17/23  Yes Maranda Jamee Jacob, MD  sertraline  (ZOLOFT ) 100 MG tablet Take 1.5 tablets (150 mg total) by mouth daily. Patient needs appointment. 08/04/23  Yes   triamterene -hydrochlorothiazide  (DYAZIDE ) 37.5-25 MG capsule TAKE 1 EACH (1 CAPSULE TOTAL) BY MOUTH DAILY. 04/30/18  Yes Clarisa Kung, PA-C  verapamil  (CALAN -SR) 180 MG CR tablet Take 2 tablets (360 mg total) by mouth at bedtime. Patient taking differently: Take 360 mg by mouth daily. 07/11/17  Yes Loreli Elyn SAILOR, MD  levothyroxine  (SYNTHROID ) 137 MCG tablet TAKE 1 TABLET (137 MCG TOTAL) BY MOUTH DAILY  BEFORE BREAKFAST. 05/09/18   Clarisa Kung, PA-C    Family History Family History  Problem Relation Age of Onset   Heart disease Father        CABG in his 29s   Hyperlipidemia Father    Heart disease Brother        CABG   Heart disease Sister        Stents in 49s   Hyperlipidemia Sister    Heart disease Sister        CABG in 53s   Hyperlipidemia Sister    Hyperlipidemia Mother    Breast cancer Maternal Aunt    Hyperlipidemia Maternal Uncle    Hyperlipidemia Maternal Grandmother    Breast cancer Maternal Grandmother    Hyperlipidemia Paternal Grandmother    Breast cancer Paternal Grandmother     Social History Social History   Tobacco Use   Smoking status: Former    Current packs/day: 0.00    Average packs/day: 2.0 packs/day for 20.0 years (40.0 ttl pk-yrs)    Types: Cigarettes    Start date: 05/13/1970    Quit date: 05/13/1990    Years since quitting: 33.4   Smokeless tobacco: Never  Vaping Use   Vaping status: Never Used  Substance Use Topics   Alcohol use: No    Comment: Rarely   Drug use: No     Allergies   Ibuprofen and Lisinopril   Review of Systems Review of Systems See HPI  Physical Exam Triage Vital Signs ED Triage Vitals  Encounter Vitals Group     BP 10/17/23 1326 (!) 167/93     Girls Systolic BP Percentile --      Girls Diastolic BP Percentile --      Boys Systolic BP Percentile --      Boys Diastolic BP Percentile --      Pulse Rate 10/17/23 1326 77     Resp 10/17/23 1326 17     Temp 10/17/23 1326 97.7 F (36.5 C)     Temp Source 10/17/23 1326 Oral     SpO2 10/17/23 1326 98 %     Weight 10/17/23 1322 200 lb (90.7 kg)     Height 10/17/23 1322 5' 5 (1.651 m)     Head Circumference --      Peak Flow --      Pain Score 10/17/23 1322 7     Pain Loc --      Pain Education --      Exclude from Growth Chart --    No data found.  Updated Vital Signs BP (!) 167/93 (BP Location: Left Arm)   Pulse 77   Temp  97.7 F (36.5 C) (Oral)    Resp 17   Ht 5' 5 (1.651 m)   Wt 90.7 kg   LMP 06/17/2002   SpO2 98%   BMI 33.28 kg/m      Physical Exam Constitutional:      General: She is not in acute distress.    Appearance: She is well-developed. She is ill-appearing.  HENT:     Head: Normocephalic and atraumatic.     Comments: Frontal ethmoid and maxillary sinuses are uniformly tender.  Nasal membranes are swollen and red.  Posterior pharynx has erythema    Mouth/Throat:     Mouth: Mucous membranes are moist.  Eyes:     Conjunctiva/sclera: Conjunctivae normal.     Pupils: Pupils are equal, round, and reactive to light.  Cardiovascular:     Rate and Rhythm: Normal rate and regular rhythm.     Heart sounds: Normal heart sounds.  Pulmonary:     Effort: Pulmonary effort is normal. No respiratory distress.     Breath sounds: Normal breath sounds.  Abdominal:     Palpations: Abdomen is soft.  Musculoskeletal:        General: Normal range of motion.     Cervical back: Normal range of motion.  Lymphadenopathy:     Cervical: Cervical adenopathy present.  Skin:    General: Skin is warm and dry.  Neurological:     Mental Status: She is alert.      UC Treatments / Results  Labs (all labs ordered are listed, but only abnormal results are displayed) Labs Reviewed - No data to display  EKG   Radiology No results found.  Procedures Procedures (including critical care time)  Medications Ordered in UC Medications - No data to display  Initial Impression / Assessment and Plan / UC Course  I have reviewed the triage vital signs and the nursing notes.  Pertinent labs & imaging results that were available during my care of the patient were reviewed by me and considered in my medical decision making (see chart for details).      Final Clinical Impressions(s) / UC Diagnoses   Final diagnoses:  Acute sinusitis with symptoms greater than 10 days     Discharge Instructions      Take the Augmentin  antibiotic  2 times a day Take with food Consider taking a probiotic while you are on Augmentin  Take prednisone  once a day in the morning for the next 5 days Make sure you are drinking lots of water Use the saline or salt water nasal rinse See your doctor if not improved by next week   ED Prescriptions     Medication Sig Dispense Auth. Provider   amoxicillin -clavulanate (AUGMENTIN ) 875-125 MG tablet Take 1 tablet by mouth every 12 (twelve) hours. 20 tablet Maranda Jamee Jacob, MD   predniSONE  (DELTASONE ) 20 MG tablet Take 2 tablets (40 mg total) by mouth daily with breakfast. 10 tablet Maranda Jamee Jacob, MD      PDMP not reviewed this encounter.   Maranda Jamee Jacob, MD 10/17/23 1357

## 2023-10-17 NOTE — ED Triage Notes (Signed)
 Pt states that she has a headache and facial pain. X2-3 weeks

## 2024-02-05 ENCOUNTER — Other Ambulatory Visit: Payer: Self-pay

## 2024-02-08 ENCOUNTER — Other Ambulatory Visit: Payer: Self-pay

## 2024-02-19 ENCOUNTER — Other Ambulatory Visit: Payer: Self-pay

## 2024-02-19 DIAGNOSIS — Z859 Personal history of malignant neoplasm, unspecified: Secondary | ICD-10-CM

## 2024-02-19 DIAGNOSIS — R1084 Generalized abdominal pain: Secondary | ICD-10-CM

## 2024-02-19 DIAGNOSIS — K7689 Other specified diseases of liver: Secondary | ICD-10-CM
